# Patient Record
Sex: Female | Born: 1970 | Race: White | Hispanic: No | Marital: Married | Smoking: Never smoker
Health system: Southern US, Community
[De-identification: ages and names within clinical notes are randomized; demographics above are authoritative.]

## PROBLEM LIST (undated history)

## (undated) DIAGNOSIS — N183 Chronic kidney disease, stage 3 unspecified: Secondary | ICD-10-CM

## (undated) DIAGNOSIS — E1122 Type 2 diabetes mellitus with diabetic chronic kidney disease: Secondary | ICD-10-CM

## (undated) DIAGNOSIS — F4310 Post-traumatic stress disorder, unspecified: Secondary | ICD-10-CM

## (undated) DIAGNOSIS — F419 Anxiety disorder, unspecified: Secondary | ICD-10-CM

## (undated) DIAGNOSIS — F329 Major depressive disorder, single episode, unspecified: Secondary | ICD-10-CM

## (undated) DIAGNOSIS — F32A Depression, unspecified: Secondary | ICD-10-CM

## (undated) HISTORY — DX: Chronic kidney disease, stage 3 unspecified: N18.30

## (undated) HISTORY — DX: Major depressive disorder, single episode, unspecified: F32.9

## (undated) HISTORY — DX: Depression, unspecified: F32.A

## (undated) HISTORY — DX: Type 2 diabetes mellitus with diabetic chronic kidney disease: E11.22

## (undated) HISTORY — DX: Post-traumatic stress disorder, unspecified: F43.10

## (undated) HISTORY — DX: Chronic kidney disease, stage 3 (moderate): N18.3

## (undated) HISTORY — DX: Anxiety disorder, unspecified: F41.9

---

## 1997-08-29 ENCOUNTER — Ambulatory Visit (HOSPITAL_COMMUNITY): Admission: RE | Admit: 1997-08-29 | Discharge: 1997-08-30 | Payer: Self-pay | Admitting: Ophthalmology

## 1998-01-09 ENCOUNTER — Ambulatory Visit (HOSPITAL_COMMUNITY): Admission: RE | Admit: 1998-01-09 | Discharge: 1998-01-10 | Payer: Self-pay | Admitting: Ophthalmology

## 2006-06-16 ENCOUNTER — Encounter: Payer: Self-pay | Admitting: Family Medicine

## 2006-06-16 LAB — CONVERTED CEMR LAB
Cholesterol: 181 mg/dL
Triglycerides: 196 mg/dL

## 2006-06-18 ENCOUNTER — Encounter: Payer: Self-pay | Admitting: Family Medicine

## 2006-06-18 LAB — CONVERTED CEMR LAB
Chloride: 109 meq/L
Potassium: 4.8 meq/L

## 2006-11-12 ENCOUNTER — Ambulatory Visit: Payer: Self-pay | Admitting: Family Medicine

## 2006-11-12 DIAGNOSIS — F419 Anxiety disorder, unspecified: Secondary | ICD-10-CM

## 2006-11-12 DIAGNOSIS — F331 Major depressive disorder, recurrent, moderate: Secondary | ICD-10-CM

## 2006-11-12 DIAGNOSIS — R635 Abnormal weight gain: Secondary | ICD-10-CM | POA: Insufficient documentation

## 2006-12-15 ENCOUNTER — Telehealth: Payer: Self-pay | Admitting: Family Medicine

## 2006-12-16 ENCOUNTER — Ambulatory Visit: Payer: Self-pay | Admitting: Family Medicine

## 2006-12-16 DIAGNOSIS — G4700 Insomnia, unspecified: Secondary | ICD-10-CM | POA: Insufficient documentation

## 2007-12-26 HISTORY — PX: OTHER SURGICAL HISTORY: SHX169

## 2008-02-09 HISTORY — PX: BREAST SURGERY: SHX581

## 2008-02-09 HISTORY — PX: LIPOSUCTION: SHX10

## 2009-02-19 ENCOUNTER — Encounter: Admission: RE | Admit: 2009-02-19 | Discharge: 2009-02-19 | Payer: Self-pay

## 2009-02-19 ENCOUNTER — Ambulatory Visit: Payer: Self-pay | Admitting: Obstetrics and Gynecology

## 2009-02-20 LAB — HM PAP SMEAR

## 2009-02-20 LAB — HM MAMMOGRAPHY

## 2009-02-21 ENCOUNTER — Ambulatory Visit: Payer: Self-pay | Admitting: Obstetrics & Gynecology

## 2009-05-14 ENCOUNTER — Ambulatory Visit: Payer: Self-pay | Admitting: Occupational Medicine

## 2009-05-14 DIAGNOSIS — M6530 Trigger finger, unspecified finger: Secondary | ICD-10-CM | POA: Insufficient documentation

## 2009-07-03 ENCOUNTER — Ambulatory Visit: Payer: Self-pay | Admitting: Family Medicine

## 2009-07-03 DIAGNOSIS — J069 Acute upper respiratory infection, unspecified: Secondary | ICD-10-CM | POA: Insufficient documentation

## 2009-07-03 DIAGNOSIS — J301 Allergic rhinitis due to pollen: Secondary | ICD-10-CM

## 2009-09-18 ENCOUNTER — Ambulatory Visit: Payer: Self-pay | Admitting: Obstetrics & Gynecology

## 2009-10-05 ENCOUNTER — Ambulatory Visit: Payer: Self-pay | Admitting: Family Medicine

## 2009-10-24 ENCOUNTER — Telehealth: Payer: Self-pay | Admitting: Family Medicine

## 2009-10-24 ENCOUNTER — Ambulatory Visit: Payer: Self-pay | Admitting: Family Medicine

## 2009-10-25 ENCOUNTER — Telehealth: Payer: Self-pay | Admitting: Family Medicine

## 2009-10-26 ENCOUNTER — Telehealth (INDEPENDENT_AMBULATORY_CARE_PROVIDER_SITE_OTHER): Payer: Self-pay | Admitting: *Deleted

## 2009-10-30 ENCOUNTER — Telehealth: Payer: Self-pay | Admitting: Family Medicine

## 2009-11-23 ENCOUNTER — Ambulatory Visit: Payer: Self-pay | Admitting: Family Medicine

## 2009-11-23 DIAGNOSIS — I10 Essential (primary) hypertension: Secondary | ICD-10-CM | POA: Insufficient documentation

## 2009-11-23 DIAGNOSIS — E669 Obesity, unspecified: Secondary | ICD-10-CM | POA: Insufficient documentation

## 2009-11-23 LAB — CONVERTED CEMR LAB: Hgb A1c MFr Bld: 8.4 %

## 2009-11-27 ENCOUNTER — Telehealth: Payer: Self-pay | Admitting: Family Medicine

## 2009-12-17 ENCOUNTER — Ambulatory Visit: Payer: Self-pay | Admitting: Family Medicine

## 2010-02-24 ENCOUNTER — Ambulatory Visit
Admission: RE | Admit: 2010-02-24 | Discharge: 2010-02-24 | Payer: Self-pay | Source: Home / Self Care | Admitting: Family Medicine

## 2010-02-24 DIAGNOSIS — S99919A Unspecified injury of unspecified ankle, initial encounter: Secondary | ICD-10-CM

## 2010-02-24 DIAGNOSIS — S99929A Unspecified injury of unspecified foot, initial encounter: Secondary | ICD-10-CM

## 2010-02-24 DIAGNOSIS — S93609A Unspecified sprain of unspecified foot, initial encounter: Secondary | ICD-10-CM | POA: Insufficient documentation

## 2010-02-24 DIAGNOSIS — S8990XA Unspecified injury of unspecified lower leg, initial encounter: Secondary | ICD-10-CM | POA: Insufficient documentation

## 2010-02-27 ENCOUNTER — Telehealth (INDEPENDENT_AMBULATORY_CARE_PROVIDER_SITE_OTHER): Payer: Self-pay | Admitting: *Deleted

## 2010-03-05 ENCOUNTER — Ambulatory Visit
Admission: RE | Admit: 2010-03-05 | Discharge: 2010-03-05 | Payer: Self-pay | Source: Home / Self Care | Attending: Obstetrics & Gynecology | Admitting: Obstetrics & Gynecology

## 2010-03-13 ENCOUNTER — Ambulatory Visit
Admission: RE | Admit: 2010-03-13 | Discharge: 2010-03-13 | Payer: Self-pay | Source: Home / Self Care | Attending: Obstetrics & Gynecology | Admitting: Obstetrics & Gynecology

## 2010-03-19 ENCOUNTER — Encounter: Payer: Self-pay | Admitting: Family Medicine

## 2010-03-19 ENCOUNTER — Ambulatory Visit
Admission: RE | Admit: 2010-03-19 | Discharge: 2010-03-19 | Payer: Self-pay | Source: Home / Self Care | Attending: Family Medicine | Admitting: Family Medicine

## 2010-03-19 DIAGNOSIS — M545 Low back pain: Secondary | ICD-10-CM | POA: Insufficient documentation

## 2010-03-19 DIAGNOSIS — F909 Attention-deficit hyperactivity disorder, unspecified type: Secondary | ICD-10-CM | POA: Insufficient documentation

## 2010-03-19 LAB — CONVERTED CEMR LAB: Hgb A1c MFr Bld: 8.1 %

## 2010-03-26 ENCOUNTER — Encounter (INDEPENDENT_AMBULATORY_CARE_PROVIDER_SITE_OTHER): Payer: Self-pay | Admitting: *Deleted

## 2010-03-26 NOTE — Progress Notes (Signed)
       New/Updated Medications: BD PEN NEEDLE MINI U/F 31G X 5 MM MISC (INSULIN PEN NEEDLE) Use as directed for insulin Prescriptions: BD PEN NEEDLE MINI U/F 31G X 5 MM MISC (INSULIN PEN NEEDLE) Use as directed for insulin  #1 box x 3   Entered by:   Payton Spark CMA   Authorized by:   Seymour Bars DO   Signed by:   Payton Spark CMA on 10/26/2009   Method used:   [facility] to        [facility] [phone]* (retail)       [address]       [place], [place]  [address]       Ph: [phone] or [phone]       Fax: [phone]   RxID:   [id number]
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
## 2010-03-26 NOTE — Letter (Signed)
Summary: Work Paediatric nurse [facility]  [address]   [place], [place] [address]   Phone: [phone]  Fax: [phone]    Today's Date: May 14, 2009  Name of Patient: Melissa [name] [place]  The above named patient had a medical visit today at:  am / pm.  Please take this into consideration when reviewing the time away from work/school.    Special Instructions:  [  ] None  [ X ] To be off the remainder of today, returning to the normal work / school schedule tomorrow.  [  ] To be off until the next scheduled appointment on ______________________.  [  ] Other ________________________________________________________________ ________________________________________________________________________   Sincerely yours,   Lucia Gaskins MD

## 2010-03-26 NOTE — Progress Notes (Signed)
Summary: Latisse       New/Updated Medications: LATISSE 0.03 % SOLN (BIMATOPROST) Use as directed. Prescriptions: LATISSE 0.03 % SOLN (BIMATOPROST) Use as directed.  #1 bottle x 1   Entered by:   Kathlene November   Authorized by:   Seymour Bars DO   Signed by:   Kathlene November on 10/25/2009   Method used:   [facility]        [facility] [phone]* (retail)       [address]       [place], [place]  [address]       Ph: [phone] or [phone]       Fax: [phone]   RxID:   [id number]    Pt also uses Latisse and [facility] Is this OK?  Dr. Leonard Schwartz

## 2010-03-26 NOTE — Assessment & Plan Note (Signed)
Summary: BACK PAIN/NUMBNESS  (left arm) Cuff size:   regular  Vitals Entered By: Areta Haber CMA (October 05, 2009 2:37 PM)                  Current Allergies: ! PCN ! * BENZACAINE Assessment PATIENT DECIDED TO PROCEED TO ER  The patient and/or caregiver has been counseled thoroughly with regard to medications prescribed including dosage, schedule, interactions, rationale for use, and possible side effects and they verbalize understanding.  Diagnoses and expected course of recovery discussed and will return if not improved as expected or if the condition worsens. Patient and/or caregiver verbalized understanding.
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
## 2010-03-26 NOTE — Progress Notes (Signed)
Summary: Multiple questions  Phone Note Call from Patient Call back at [phone]   Caller: Patient Call For: Seymour Bars DO Summary of Call: Needs refill on  Am lactin Lotion apply to dry skin daily and Retina micro derm cream use once a day for wrinkles and dark spots- was told to call with these names for you to call in- [facility] [address] and then 90 day supply sent to [facility] for both.  Sugars have been running high since decreased Levemir- am sugars 240-260 and afternoons around 90- 110. Needs her Xanax sent to [facility] as well she says she is completely out of them. Initial call taken by: Kathlene November LPN,  November 27, 2009 11:04 AM  Follow-up for Phone Call        #270 of Xanax sent in 9/30 -- she is NOT due for RF of this. I sent in her 2 creams locally and mail order.  Keep on on sugars. I cut her dose of Levemir due to lows. Call if AM fastings are consistently running >150 and if so, will go back up to 40 units on her Levemir.   Follow-up by: Seymour Bars DO,  November 27, 2009 11:15 AM    New/Updated Medications: * AMLACTIN LOTION apply to dry skin daily TRETINOIN 0.025 % CREA (TRETINOIN) apply to wrinkles and dry spots at bedtime as directed Prescriptions: TRETINOIN 0.025 % CREA (TRETINOIN) apply to wrinkles and dry spots at bedtime as directed  #1 tube x 0   Entered and Authorized by:   Seymour Bars DO   Signed by:   Seymour Bars DO on 11/27/2009   Method used:   [facility]        [facility] [phone]* (retail)       [address]       [place], [place]  [address]       Ph: [phone] or [phone]       Fax: [phone]   RxID:   [id number] AMLACTIN LOTION apply to dry skin daily  #1 bottle x 0   Entered and Authorized by:   Seymour Bars DO   Signed by:   Seymour Bars DO on 11/27/2009   Method used:   Printed then faxed to ...       [facility]  [address] [phone]* (retail)       [address] [place], [place]   [address]       Ph: [phone] or [phone]       Fax: [phone]   RxID:   [id number]   Appended Document: Multiple questions Prescriptions: TRETINOIN 0.025 % CREA (TRETINOIN) apply to wrinkles and dry spots at bedtime as directed  #3 tubes x 1   Entered and Authorized by:   Seymour Bars DO   Signed by:   Seymour Bars DO on 11/27/2009   Method used:   Printed then faxed to ...       [facility]  [address] [phone]* (retail)       [address] [place], [place]  [address]       Ph: [phone] or [phone]       Fax: [phone]   RxID:   [id number] AMLACTIN LOTION apply to dry skin daily  #3 bottles x 1   Entered and Authorized by:   Seymour Bars DO   Signed by:  Seymour Bars DO on 11/27/2009   Method used:   Printed then faxed to ...       [facility]  [address] [phone]* (retail)       [address] [place], [place]  [address]       Ph: [phone] or [phone]       Fax: [phone]   RxID:   [id number]   Appended Document: Multiple questions 11/27/2009 @ 11:23am- Pt notified of MD instructions and that meds were sent. kJ LPN

## 2010-03-26 NOTE — Progress Notes (Signed)
Summary: pt has questions about meds and wants to come back in-jr  Phone Note Call from Patient   Caller: Patient Call For: Seymour Bars DO Summary of Call: Pt was seen by Dr.Bowen yesterday and states she would like to come back today because she has a few questions she forgot to ask while she was here and she also has a problem with her Diabetic medicine.. She states she left Marcelino Duster a message yesterday and hasn't heard anything back... Please try cell # first and if she does not answer call her home number.Call [phone] or Home#[phone] Thanks.Michaelle Copas  October 25, 2009 9:40 AM  Initial call taken by: Michaelle Copas,  October 25, 2009 9:40 AM  Follow-up for Phone Call        Having a reaction to Amaryl- causing itching, nausea, vomiting and H/A. Got the flex pen but needs a rx for the needles for the flex pen [facility] [address]. Also discussed changing the Ambien to the extended release because only sleeping 3 hours a night- would like this sent to pharmacy as well Needs rx for Xanax to fax to [facility]  Follow-up by: Kathlene November,  October 25, 2009 10:08 AM  Additional Follow-up for Phone Call Additional follow up Details #1::        amaryl added to allergy list -- STOP. Replace with Metformin once daily, sent to pharmacy. Pen needles sent to pharmacy today. Xanax - need proof of last fill date from pharmacy. Ambien CR was sent YESTERDAY. Additional Follow-up by: Seymour Bars DO,  October 25, 2009 10:38 AM   New Allergies: ! AMARYL (GLIMEPIRIDE) Additional Follow-up for Phone Call Additional follow up Details #2::    Pt notified that med and needles sent to pharmacy. Instructed pt needs to get pharmacy to fax [place] copy of last refill date for the Xanax and verify that pharmacy did not receive Ambien rx. Pt states they did not Follow-up by: Kathlene November,  October 25, 2009 10:42 AM  New/Updated Medications: GLUCOPHAGE XR 500 MG XR24H-TAB (METFORMIN  HCL) 1 tab by mouth daily EXEL PEN NEEDLES 1/3" 31G X 8 MM MISC (INSULIN PEN NEEDLE) use daily as directed New Allergies: ! AMARYL (GLIMEPIRIDE)Prescriptions: GLUCOPHAGE XR 500 MG XR24H-TAB (METFORMIN HCL) 1 tab by mouth daily  #30 x 2   Entered and Authorized by:   Seymour Bars DO   Signed by:   Seymour Bars DO on 10/25/2009   Method used:   [facility]        [facility] [phone]* (retail)       [address]       [place], [place]  [address]       Ph: [phone] or [phone]       Fax: [phone]   RxID:   [id number] EXEL PEN NEEDLES 1/3" 31G X 8 MM MISC (INSULIN PEN NEEDLE) use daily as directed  #100 x 3   Entered and Authorized by:   Seymour Bars DO   Signed by:   Seymour Bars DO on 10/25/2009   Method used:   Electronically to        [facility] [phone]* (retail)       [address]       [place], [place]  [address]       Ph: [phone] or [phone]       Fax: [phone]   RxID:   [id number]

## 2010-03-26 NOTE — Progress Notes (Signed)
Summary: Insulin ?  Phone Note Call from Patient   Caller: Patient Summary of Call: Pt [facility] stating she is doing well w/ new insulin. Pt would like to know if you can also change her to a daytime insulin so she can stop oral meds. Please advise. Initial call taken by: Payton Spark CMA,  October 30, 2009 11:26 AM  Follow-up for Phone Call        Will stop her Metformin. Will change Humulin R to Apidra which is mealtime insulin.  She is going to start with 5 units injection at the start of each meal.   Stay on Levemir once a day long acting insulin.  I will set her up with nutritionist to work on carb conting with her new start of Apidra. Follow-up by: Seymour Bars DO,  October 30, 2009 11:37 AM    New/Updated Medications: APIDRA SOLOSTAR 100 UNIT/ML SOLN (INSULIN GLULISINE) 5-10 units [place] injection with breakfast, lunch and dinner Prescriptions: APIDRA SOLOSTAR 100 UNIT/ML SOLN (INSULIN GLULISINE) 5-10 units  injection with breakfast, lunch and dinner  #2 boxes x 3   Entered and Authorized by:   Seymour Bars DO   Signed by:   Seymour Bars DO on 10/30/2009   Method used:   [facility]        [facility] [phone]* (retail)       [address]       [place], [place]  [address]       Ph: [phone] or [phone]       Fax: [phone]   RxID:   [id number]   Appended Document: Insulin ? 10/30/2009 @ 11:42am- LM with mother for pt to return call. KJ LPN  Appended Document: Insulin ? LMOM for Pt to CB.Arvilla Market CMA, Michelle October 30, 2009 11:53 AM   Pt aware of the above.Arvilla Market CMA, Michelle October 30, 2009 1:03 PM
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
## 2010-03-26 NOTE — Letter (Signed)
Summary: Out of Work  [facility]  [address]   [place], [place] [address]   Phone: [phone]  Fax: [phone]    Jul 03, 2009   Employee:  [employer] [place]    To Whom It May Concern:   For Medical reasons, please excuse the above named employee from work today and tomorrow.    If you need additional information, please feel free to contact our office.         Sincerely,    Donna Christen MD
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
## 2010-03-28 NOTE — Progress Notes (Signed)
  Phone Note Outgoing Call Call back at [facility] Phone [phone]   Call placed by: Lajean Saver RN,  February 27, 2010 11:44 AM Call placed to: Patient Summary of Call: Callback: No answer. Message left with reason for call and call back with any questions or concerns
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
## 2010-03-30 ENCOUNTER — Encounter: Payer: Self-pay | Admitting: Obstetrics & Gynecology

## 2010-04-03 NOTE — Letter (Signed)
Summary: Primary Care Consult Scheduled Letter  [facility] [place]  [address]   [place], [place] [address]   Phone: [phone]  Fax: [phone]      03/26/2010 MRN: [id number]  [place] [address] [place], [place]  [address]    Dear Ms. [place],  We have scheduled an appointment for you.  At the recommendation of Dr.Bowen, we have scheduled you a consult with [facility] on 06-21-10 at 9:00.  Their address is [address] , [address] [place].. The office phone number is [phone].  If this appointment day and time is not convenient for you, please feel free to call the office of the doctor you are being referred to at the number listed above and reschedule the appointment.     It is important for you to keep your scheduled appointments. We are here to make sure you are given good patient care.     Thank you, Michaelle Copas Patient Care Coordinator [facility] Kathryne Sharper

## 2010-04-15 ENCOUNTER — Encounter: Payer: Self-pay | Admitting: Family Medicine

## 2010-04-17 NOTE — Letter (Signed)
Summary: ADHD Questionnaire  ADHD Questionnaire   Imported By: Lanelle Bal 04/09/2010 13:17:54  _____________________________________________________________________  External Attachment:    Type:   Image     Comment:   External Document

## 2010-04-18 ENCOUNTER — Telehealth: Payer: Self-pay | Admitting: Family Medicine

## 2010-04-23 ENCOUNTER — Ambulatory Visit: Payer: BC Managed Care – PPO | Admitting: Obstetrics & Gynecology

## 2010-04-23 DIAGNOSIS — Z30431 Encounter for routine checking of intrauterine contraceptive device: Secondary | ICD-10-CM

## 2010-04-23 DIAGNOSIS — N930 Postcoital and contact bleeding: Secondary | ICD-10-CM

## 2010-04-23 NOTE — Progress Notes (Addendum)
Summary: [facility]-Referral  Phone Note Call from Patient Call back at Home Phone [phone] Call back at [phone]   Caller: Patient Call For: Seymour Bars DO Summary of Call: Pt is unable to afford $40/visit copay for PT.  Pt previously went 3 x week.  Pt is wondering if she can have a consult at pain clinic instead.  Afternoons will be better, prefers [place], but doesn't have to be.  Advised pt referral will take a few days to complete after approval is given. Also, pt states her lawyer will be requesting records soon regarding back pain.  Advised records are copied on Thursdays only.  Pt is agreeable. Initial call taken by: Francee Piccolo CMA Duncan Dull),  April 18, 2010 11:39 AM  Follow-up for Phone Call        1. I don't have any records in regards to her back pain -- I would need this from ortho prior to making a referral. 2. I still think that PT or chiropractic therapy is a good adjunct for her chronic back pain and am against only treating her with narcotics. 3.  Our office need a signed release for any medical records that are requested. Follow-up by: Seymour Bars DO,  April 18, 2010 11:50 AM     Appended Document: [facility]-Referral [facility]OM for Pt to CB

## 2010-05-09 ENCOUNTER — Encounter: Payer: Self-pay | Admitting: Family Medicine

## 2010-05-19 ENCOUNTER — Encounter: Payer: Self-pay | Admitting: Family Medicine

## 2010-05-20 ENCOUNTER — Ambulatory Visit: Payer: Self-pay | Admitting: Family Medicine

## 2010-05-21 ENCOUNTER — Ambulatory Visit: Payer: BC Managed Care – PPO | Admitting: Family Medicine

## 2010-05-27 ENCOUNTER — Encounter: Payer: Self-pay | Admitting: Family Medicine

## 2010-06-05 ENCOUNTER — Encounter: Payer: Self-pay | Admitting: Family Medicine

## 2010-06-05 ENCOUNTER — Ambulatory Visit (INDEPENDENT_AMBULATORY_CARE_PROVIDER_SITE_OTHER): Payer: BC Managed Care – PPO | Admitting: Family Medicine

## 2010-06-05 DIAGNOSIS — F329 Major depressive disorder, single episode, unspecified: Secondary | ICD-10-CM

## 2010-06-05 DIAGNOSIS — E119 Type 2 diabetes mellitus without complications: Secondary | ICD-10-CM

## 2010-06-05 DIAGNOSIS — I10 Essential (primary) hypertension: Secondary | ICD-10-CM

## 2010-06-05 MED ORDER — AMBULATORY NON FORMULARY MEDICATION: Status: DC

## 2010-06-05 MED ORDER — INSULIN DETEMIR 100 UNIT/ML ~~LOC~~ SOLN: 46.0000 [IU] | Freq: Every day | SUBCUTANEOUS | Status: DC

## 2010-06-05 NOTE — Progress Notes (Signed)
  Subjective:    Patient ID: Melissa [name] [place], female    DOB: Jun 01, 1970, 40 y.o.   MRN: [id number]  HPI  40 yo WF presents for f/u visit.  She has stopped her weight loss program at [employer].  She has regained some of her weight but continues to work on diabetic diet with regular exercise.  She adjusted Levemir to 46 u at bedtime (an increase after having steroid injections in her back which raised her readings).  This has her AM fastings 100-130.  She is continuing to see pysch, on Xanax, Wellbutrin and doing fairly well.  Her back pain is improving thru pain mgmt.  She would like to RF her Retin A and Latisse today and requests a new meter to check sugars.  She is doing Novolog 10-14 u with meals.  Denies any lows.  Her eye exam is UTD. She is not on anything for BP and reports readings of 120's/ 80s at home and at specialist visitis.  BP 182/101  Pulse 111  Ht 5\' 4"  (1.626 m)  Wt 172 lb (78.019 kg)  BMI 29.52 kg/m2  SpO2 98%     Review of Systems  Constitutional: Positive for unexpected weight change. Negative for fever, appetite change and fatigue.  Eyes: Negative for visual disturbance.  Respiratory: Negative for shortness of breath.   Cardiovascular: Negative for chest pain, palpitations and leg swelling.  Gastrointestinal: Negative for diarrhea and constipation.  Genitourinary: Negative for frequency.  Musculoskeletal: Positive for back pain.  Neurological: Negative for numbness.  Psychiatric/Behavioral: Negative for suicidal ideas, sleep disturbance, dysphoric mood and agitation. The patient is nervous/anxious.        Objective:   Physical Exam  Constitutional: She appears well-developed and well-nourished.       overwt  HENT:  Head: Normocephalic and atraumatic.  Eyes: Pupils are equal, round, and reactive to light. No scleral icterus.  Neck: Normal range of motion. Neck supple. No thyromegaly present.  Cardiovascular: Normal rate, regular rhythm and normal heart sounds.    No murmur heard. Pulmonary/Chest: Effort normal and breath sounds normal.  Musculoskeletal: She exhibits no edema.  Skin: Skin is warm and dry.  Psychiatric: She has a normal mood and affect.          Assessment & Plan:

## 2010-06-05 NOTE — Patient Instructions (Signed)
Meds RFd.  Keep an eye on Blood pressure; goal at home is <130/80.  Drop down on Levemir if sugar running <100 AM fasting once done with steroid injections.  Bring by copy of labs.  Return for A1C/ WT/ BP check in 1 month (nurse visit)

## 2010-06-07 MED ORDER — BIMATOPROST 0.03 % EX SOLN: 1.0000 [drp] | Freq: Every day | CUTANEOUS | Status: DC

## 2010-06-07 MED ORDER — ZOLPIDEM TARTRATE ER 12.5 MG PO TBCR: 12.5000 mg | EXTENDED_RELEASE_TABLET | Freq: Every evening | ORAL | Status: DC | PRN

## 2010-06-07 MED ORDER — ALPRAZOLAM 1 MG PO TABS: 1.0000 mg | ORAL_TABLET | Freq: Three times a day (TID) | ORAL | Status: DC

## 2010-06-07 MED ORDER — TRETINOIN 0.025 % EX CREA: TOPICAL_CREAM | Freq: Every day | CUTANEOUS | Status: DC

## 2010-06-07 MED ORDER — INSULIN PEN NEEDLE 32G X 5 MM MISC: Status: DC

## 2010-06-07 NOTE — Assessment & Plan Note (Signed)
BP HIGH today.  She is not taking anything and reports her home readings to be 'normal'.  She will monitor them at home and call if >130/80 and RTC in 1 mo for a nurse BP check.

## 2010-06-07 NOTE — Assessment & Plan Note (Signed)
She was not quite due for A1C yet.  Her last one was 8.1 and her home readings look good on current dose of Levemir and Novolog.  Continue and look for lower readings once steroid injections are completed.  Her eye exam and labs are UTD.  Will check old chart for last urine micro since she is not on an ACEi.

## 2010-06-07 NOTE — Assessment & Plan Note (Signed)
Stable.  Seeing psych.

## 2010-06-18 ENCOUNTER — Encounter: Payer: Self-pay | Admitting: Family Medicine

## 2010-06-21 ENCOUNTER — Ambulatory Visit (INDEPENDENT_AMBULATORY_CARE_PROVIDER_SITE_OTHER): Payer: BC Managed Care – PPO | Admitting: Psychiatry

## 2010-06-21 DIAGNOSIS — F988 Other specified behavioral and emotional disorders with onset usually occurring in childhood and adolescence: Secondary | ICD-10-CM

## 2010-06-21 DIAGNOSIS — F411 Generalized anxiety disorder: Secondary | ICD-10-CM

## 2010-06-21 DIAGNOSIS — F339 Major depressive disorder, recurrent, unspecified: Secondary | ICD-10-CM

## 2010-07-05 ENCOUNTER — Other Ambulatory Visit: Payer: BC Managed Care – PPO
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
## 2010-07-09 NOTE — Assessment & Plan Note (Signed)
Melissa [name], GARROD                ACCOUNT NO.:  [id number]   MEDICAL RECORD NO.:  [id number]           PATIENT TYPE:   LOCATION:  [facility] [place]           FACILITY:   PHYSICIAN:  Caren Griffins, CNM       DATE OF BIRTH:  02/26/1970   DATE OF SERVICE:  02/19/2009                                  CLINIC NOTE   REASON FOR VISIT:  Annual GYN exam, desires Pap, and desires removal and  replacement of Mirena IUD.   HISTORY:  This is a 40 year old, G0, P0, who has no complaints today and  is very happy with the IUD which she had placed by Dr. Mia Creek in  [place] in January 2006.  She says that she was told the strings were  cut short and she has never done manual string checks.  She has been  amenorrheic for 4 years.  She states that she has white coat  hypertension and that she checks her blood pressure frequently at work  and it is usually in 120/80.   MENSTRUAL HISTORY:  12 x 28 x 3-4.  No intermenstrual bleeding, no  dysmenorrhea, and as noted above no menses for about 4 years.   CONTRACEPTIVE HISTORY:  IUD only.   OBSTETRICAL HISTORY:  Nulliparous.   GYNECOLOGIC HISTORY:  Last Pap smear was November 2009 and was normal.  She did have an abnormal around 3 years ago and just had a repeat Pap.  Her initial baseline mammogram was done earlier today.  The reason it  was done was she had breast augmentation about a year ago and due to  inability to exam breast tissue.  Her advice was to get a screening  mammogram 6 months after that procedure.   SURGERIES:  Breasts implants on February 08, 2009, liposuction on  February 08, 2009, dental implants on January 09, 2009.   Family history is positive for diabetes and cardiovascular disease.  Her  father had colon cancer and lung cancer.  There have been no GYN cancers  in the family.   Past medical history is completely negative except depressive illness.  She is taking Wellbutrin 300 mg a day and is doing well on this;  however, she does have a lot of difficulty sleeping and takes Ambien to  fall asleep nearly every night.   ALLERGIES:  PENICILLIN rash.   MEDICATIONS:  As above.  She also takes multivitamin, not sure what type  or how much calcium or vitamin D is in it.  She had usual immunizations  including tetanus, flu, and pneumonia.   SOCIAL HISTORY:  Lives with her husband and stepchildren on occasion.  She works as a Dealer.  She is a nonsmoker and drinks socially  about 2 caffeinated beverages a day.  No IV drug use or substance abuse  or history of neglect.   Review of symptoms is completely negative except she does report weight  gain since her dosage of Wellbutrin and was recently increased.  She  denies any temperature, intolerance, or other thyroid-related symptoms.  However, as noted above she has gained weight and is depressed.   PHYSICAL EXAMINATION:  VITAL SIGNS:  Pulse  108, BP 135/90, weight 162,  height 5 feet 5 inches.  GENERAL:  WN and WD in no apparent distress.  HEENT:  Normocephalic.  Good dentition.  NECK:  No thyromegaly or lymphadenopathy.  HEART:  RRR without murmur.  LUNGS:  CTA bilateral.  ABDOMEN:  Soft, obese, nontender.  No masses.  EXTREMITIES:  Pulses full and equal.  No dependent edema.  PELVIC:  External genitalia significant for circumferential dark flat  pigmentation which she says is a birth mark and has been unchanged her  whole life.  It extends from left to right labia majora.  Vagina, pink,  well rugated, moist.  Cervix, clean, posterior, nulliparous.  Os, IUD  strings not visualized, attempted to retrieve from endocervix using only  sounds and Q-tip as other insurance were not available.  Unable to feel  strings bimanual.   ASSESSMENT:  1. Overweight.  2. Depression.  3. Intrauterine device strings.   PLAN:  Vernona Rieger did ultrasound and IUD appears to be in proper position.  She will return when we have the crochet hook type instrument and   attempt retrieval in the next week or two.  Continue multivitamin and  working out at [employer]  Of note, she has an appointment today with Dr.  Oda Kilts, primary care, to initiate care.           ______________________________  Caren Griffins, CNM     DP/MEDQ  D:  02/19/2009  T:  02/20/2009  Job:  [id number]

## 2010-07-25 ENCOUNTER — Other Ambulatory Visit: Payer: Self-pay | Admitting: Family Medicine

## 2010-08-09 ENCOUNTER — Encounter (INDEPENDENT_AMBULATORY_CARE_PROVIDER_SITE_OTHER): Payer: BC Managed Care – PPO | Admitting: Psychiatry

## 2010-08-09 DIAGNOSIS — F4310 Post-traumatic stress disorder, unspecified: Secondary | ICD-10-CM

## 2010-08-09 DIAGNOSIS — F909 Attention-deficit hyperactivity disorder, unspecified type: Secondary | ICD-10-CM

## 2010-09-03 ENCOUNTER — Other Ambulatory Visit: Payer: Self-pay | Admitting: Family Medicine

## 2010-09-05 ENCOUNTER — Other Ambulatory Visit: Payer: Self-pay | Admitting: *Deleted

## 2010-09-05 ENCOUNTER — Telehealth: Payer: Self-pay

## 2010-09-05 DIAGNOSIS — B373 Candidiasis of vulva and vagina: Secondary | ICD-10-CM

## 2010-09-05 MED ORDER — FLUCONAZOLE 150 MG PO TABS: 150.0000 mg | ORAL_TABLET | Freq: Once | ORAL | Status: AC

## 2010-09-05 MED ORDER — ALPRAZOLAM 1 MG PO TABS: 1.0000 mg | ORAL_TABLET | Freq: Three times a day (TID) | ORAL | Status: DC

## 2010-09-05 NOTE — Assessment & Plan Note (Signed)
NAME:  [place], Crystalle              ACCOUNT NO.:  [id number]  MEDICAL RECORD NO.:  [id number]          PATIENT TYPE:  POB  LOCATION:  [facility] [place]         FACILITY[facility]  PHYSICIAN:  Elsie Lincoln, MD      DATE OF BIRTH:  [birth year]  DATE OF SERVICE:  04/23/2010                                 CLINIC NOTE  The patient is a 40 year old female who presents for string check.  She had GC and Chlamydia cultures that were done at the time of placement and they are negative.  The patient has some bleeding since the insertion, which can be normal that she experienced with her last Mirena insertion.  There is scant brown blood currently.  Upon speculum examination, the cervix appears normal with strings 2 to 3 cm, which is still in favorable cut.  ASSESSMENT AND PLAN:  A 40 year old female with intrauterine device in situ. 1. Provera 10 mg 1 tablet p.o. q.14 days, prescription given.  The     patient can take it if the bleeding becomes bothersome, this will     thin the endometrium more or hopefully the IUD becomes situated     better in the endometrium and give her better bleeding profile. 2. Health maintenance up-to-date. 3. Return to clinic in a year for yearly visit.          ______________________________ Elsie Lincoln, MD    KL/MEDQ  D:  04/23/2010  T:  04/24/2010  Job:  [id number]

## 2010-09-05 NOTE — Telephone Encounter (Signed)
Spoke with pt, she is experiencing symptoms of yeast from an antibiotic that was given by her dentist with recent dental procedure.  Rx for Diflucan 150mg  x1 [facility] in [place].

## 2010-09-25 ENCOUNTER — Encounter (INDEPENDENT_AMBULATORY_CARE_PROVIDER_SITE_OTHER): Payer: BC Managed Care – PPO | Admitting: Psychiatry

## 2010-09-25 DIAGNOSIS — F909 Attention-deficit hyperactivity disorder, unspecified type: Secondary | ICD-10-CM

## 2010-09-25 DIAGNOSIS — F4310 Post-traumatic stress disorder, unspecified: Secondary | ICD-10-CM

## 2010-10-06 ENCOUNTER — Encounter: Payer: Self-pay | Admitting: Emergency Medicine

## 2010-10-06 ENCOUNTER — Other Ambulatory Visit: Payer: Self-pay | Admitting: Emergency Medicine

## 2010-10-06 ENCOUNTER — Inpatient Hospital Stay (INDEPENDENT_AMBULATORY_CARE_PROVIDER_SITE_OTHER)
Admission: RE | Admit: 2010-10-06 | Discharge: 2010-10-06 | Disposition: A | Discharge status: Home / Self Care | Payer: BC Managed Care – PPO | Source: Ambulatory Visit | Attending: Emergency Medicine | Admitting: Emergency Medicine

## 2010-10-06 ENCOUNTER — Ambulatory Visit
Admission: RE | Admit: 2010-10-06 | Discharge: 2010-10-06 | Disposition: A | Discharge status: Home / Self Care | Payer: BC Managed Care – PPO | Source: Ambulatory Visit | Attending: Emergency Medicine | Admitting: Emergency Medicine

## 2010-10-06 DIAGNOSIS — R05 Cough: Secondary | ICD-10-CM | POA: Insufficient documentation

## 2010-10-06 DIAGNOSIS — J209 Acute bronchitis, unspecified: Secondary | ICD-10-CM

## 2010-11-25 ENCOUNTER — Encounter (HOSPITAL_COMMUNITY): Payer: BC Managed Care – PPO | Admitting: Psychiatry

## 2010-11-29 ENCOUNTER — Encounter (INDEPENDENT_AMBULATORY_CARE_PROVIDER_SITE_OTHER): Payer: BC Managed Care – PPO | Admitting: Psychiatry

## 2010-11-29 DIAGNOSIS — F339 Major depressive disorder, recurrent, unspecified: Secondary | ICD-10-CM

## 2010-11-29 DIAGNOSIS — F909 Attention-deficit hyperactivity disorder, unspecified type: Secondary | ICD-10-CM

## 2011-01-09 ENCOUNTER — Other Ambulatory Visit (HOSPITAL_COMMUNITY): Payer: Self-pay | Admitting: Psychiatry

## 2011-01-09 DIAGNOSIS — F4310 Post-traumatic stress disorder, unspecified: Secondary | ICD-10-CM

## 2011-01-09 MED ORDER — ZOLPIDEM TARTRATE ER 12.5 MG PO TBCR: 12.5000 mg | EXTENDED_RELEASE_TABLET | Freq: Every evening | ORAL | Status: DC | PRN
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
## 2011-01-28 ENCOUNTER — Other Ambulatory Visit (HOSPITAL_COMMUNITY): Payer: Self-pay | Admitting: Psychiatry

## 2011-01-28 MED ORDER — LISDEXAMFETAMINE DIMESYLATE 50 MG PO CAPS: 50.0000 mg | ORAL_CAPSULE | ORAL | Status: DC

## 2011-01-30 ENCOUNTER — Telehealth (HOSPITAL_COMMUNITY): Payer: Self-pay

## 2011-01-30 MED ORDER — LISDEXAMFETAMINE DIMESYLATE 70 MG PO CAPS: 70.0000 mg | ORAL_CAPSULE | ORAL | Status: DC

## 2011-01-30 NOTE — Telephone Encounter (Signed)
Pt didn't realize until she filled and picked up her Vyvanse that it was for 50 mg instead of 70 mg can she get a new rx for 70 mg and she will return the 50mg 's to the pharmacy. Also would like [place] to call [facility] and let them know so that they can talk to insurance and let them know what happened.

## 2011-01-30 NOTE — Telephone Encounter (Signed)
[facility] and [facility] [place]. He will take the medication back until the new prescription. Patient will be notified which picks up a new prescription for Vyvanse 70 mg.

## 2011-02-04 ENCOUNTER — Encounter (HOSPITAL_COMMUNITY): Payer: Self-pay

## 2011-02-04 NOTE — Progress Notes (Signed)
Addended by: Darrell Jewel F on: 02/04/2011 10:38 AM   Modules accepted: Orders

## 2011-02-06 ENCOUNTER — Encounter (HOSPITAL_COMMUNITY): Payer: BC Managed Care – PPO | Admitting: Psychiatry

## 2011-02-20 ENCOUNTER — Ambulatory Visit (INDEPENDENT_AMBULATORY_CARE_PROVIDER_SITE_OTHER): Payer: BC Managed Care – PPO | Admitting: Physician Assistant

## 2011-02-20 ENCOUNTER — Encounter: Payer: Self-pay | Admitting: Physician Assistant

## 2011-02-20 ENCOUNTER — Telehealth: Payer: Self-pay | Admitting: *Deleted

## 2011-02-20 DIAGNOSIS — H9209 Otalgia, unspecified ear: Secondary | ICD-10-CM

## 2011-02-20 DIAGNOSIS — E119 Type 2 diabetes mellitus without complications: Secondary | ICD-10-CM

## 2011-02-20 DIAGNOSIS — H9201 Otalgia, right ear: Secondary | ICD-10-CM

## 2011-02-20 DIAGNOSIS — R05 Cough: Secondary | ICD-10-CM

## 2011-02-20 MED ORDER — INSULIN PEN NEEDLE 32G X 5 MM MISC: Status: DC

## 2011-02-20 MED ORDER — AZITHROMYCIN 250 MG PO TABS: ORAL_TABLET | ORAL | Status: DC

## 2011-02-20 MED ORDER — INSULIN DETEMIR 100 UNIT/ML ~~LOC~~ SOLN: 46.0000 [IU] | Freq: Every day | SUBCUTANEOUS | Status: DC

## 2011-02-20 MED ORDER — AZITHROMYCIN 1 G PO PACK: 1.0000 | PACK | Freq: Once | ORAL | Status: AC

## 2011-02-20 MED ORDER — INSULIN ASPART 100 UNIT/ML ~~LOC~~ SOLN: 5.0000 [IU] | Freq: Three times a day (TID) | SUBCUTANEOUS | Status: DC

## 2011-02-20 MED ORDER — HYDROCOD POLST-CHLORPHEN POLST 10-8 MG/5ML PO LQCR: 5.0000 mL | Freq: Two times a day (BID) | ORAL | Status: DC

## 2011-02-20 MED ORDER — CHLORPHENIRAMINE-HYDROCODONE 8-10 MG/5ML PO LQCR: 5.0000 mL | Freq: Two times a day (BID) | ORAL | Status: DC | PRN

## 2011-02-20 NOTE — Patient Instructions (Addendum)
Start Tussinex for cough at bedtime. Continue Mucinex and symptomatic care. Continue to take Zyrtec daily for ear pain. If worse in 2-3 days start zithromax due to history of pneumonia.   Make appt for Diabetes f/u.  Cough, Adult  A cough is a reflex that helps clear your throat and airways. It can help heal the body or may be a reaction to an irritated airway. A cough may only last 2 or 3 weeks (acute) or may last more than 8 weeks (chronic).  CAUSES Acute cough:  Viral or bacterial infections.  Chronic cough:  Infections.   Allergies.   Asthma.   Post-nasal drip.   Smoking.   Heartburn or acid reflux.   Some medicines.   Chronic lung problems (COPD).   Cancer.  SYMPTOMS   Cough.   Fever.   Chest pain.   Increased breathing rate.   High-pitched whistling sound when breathing (wheezing).   Colored mucus that you cough up (sputum).  TREATMENT   A bacterial cough may be treated with antibiotic medicine.   A viral cough must run its course and will not respond to antibiotics.   Your caregiver may recommend other treatments if you have a chronic cough.  HOME CARE INSTRUCTIONS   Only take over-the-counter or prescription medicines for pain, discomfort, or fever as directed by your caregiver. Use cough suppressants only as directed by your caregiver.   Use a cold steam vaporizer or humidifier in your bedroom or home to help loosen secretions.   Sleep in a semi-upright position if your cough is worse at night.   Rest as needed.   Stop smoking if you smoke.  SEEK IMMEDIATE MEDICAL CARE IF:   You have pus in your sputum.   Your cough starts to worsen.   You cannot control your cough with suppressants and are losing sleep.   You begin coughing up blood.   You have difficulty breathing.   You develop pain which is getting worse or is uncontrolled with medicine.   You have a fever.  MAKE SURE YOU:   Understand these instructions.   Will watch your  condition.   Will get help right away if you are not doing well or get worse.  Document Released: 08/09/2010 Document Revised: 10/23/2010 Document Reviewed: 08/09/2010 [facility] Patient Information 2012 [place], [place].

## 2011-02-20 NOTE — Telephone Encounter (Signed)
OK for pens.

## 2011-02-20 NOTE — Telephone Encounter (Signed)
Pt is requesting Novolog pen rather than vials that were sent.  MA, LPN

## 2011-02-20 NOTE — Telephone Encounter (Signed)
rx sent

## 2011-02-20 NOTE — Telephone Encounter (Signed)
Looked and don't see anything but the vials. Can you please enter this.  MA, LPN

## 2011-02-20 NOTE — Progress Notes (Signed)
Subjective:     Patient ID: Melissa [name], female   DOB: [birth year], 40 y.o.   MRN: [id number]  Sore Throat  This is a new problem. The current episode started in the past 7 days. The problem has been gradually worsening. Neither side of throat is experiencing more pain than the other. The maximum temperature recorded prior to her arrival was 100 - 100.9 F. The fever has been present for less than 1 day. The pain is mild. Associated symptoms include congestion, coughing, ear pain, headaches, shortness of breath and swollen glands. Pertinent negatives include no ear discharge or vomiting. She has had no exposure to strep. She has tried nothing for the symptoms. The treatment provided no relief.  Cough This is a new problem. The current episode started in the past 7 days. The problem has been gradually worsening. The problem occurs constantly. The cough is productive of sputum. Associated symptoms include chills, ear congestion, ear pain, headaches, nasal congestion, postnasal drip, a sore throat and shortness of breath. Pertinent negatives include no chest pain or fever. Associated symptoms comments: Right ear pain. The symptoms are aggravated by nothing. She has tried OTC cough suppressant (Dayquil and Nyquil for cough) for the symptoms. The treatment provided mild relief. Her past medical history is significant for bronchitis and pneumonia.  Otalgia  There is pain in the right ear. This is a new problem. The current episode started in the past 7 days. The problem occurs constantly. The problem has been unchanged. The maximum temperature recorded prior to her arrival was 100 - 100.9 F. The fever has been present for less than 1 day. The pain is mild. Associated symptoms include coughing, headaches and a sore throat. Pertinent negatives include no ear discharge or vomiting. She has tried nothing for the symptoms. The treatment provided no relief.   Patient's symptoms started Christmas day(2days ago) with  coughing, fever, N/V and chills. Patient had a fever of 101 for 1 day and since not ran a fever. No history of Asthma. Patient has a history of bronchitis that developed into pneumonia "multiple times".  The coughing keeps her up at night. She denies smoking.       Review of Systems  Constitutional: Positive for chills. Negative for fever.  HENT: Positive for ear pain, congestion, sore throat and postnasal drip. Negative for ear discharge.   Respiratory: Positive for cough and shortness of breath.        Cough keeps patient up at night.  Cardiovascular: Negative for chest pain.  Gastrointestinal: Negative for vomiting.  Neurological: Positive for headaches.       Objective:   Physical Exam  Constitutional: She appears well-developed and well-nourished.  HENT:  Head: Normocephalic and atraumatic.  Left Ear: External ear normal.  Nose: Nose normal.  Mouth/Throat: Oropharynx is clear and moist. No oropharyngeal exudate.       Right ear fluid behind ear.  Eyes: Conjunctivae are normal. Right eye exhibits no discharge. Left eye exhibits no discharge.  Neck:       Bilateral   Cardiovascular: Normal rate, regular rhythm and normal heart sounds.   Pulmonary/Chest: Effort normal. She has wheezes.       Wheezing at the base of right lung. Coarse breath sounds throughout bilaterally.  Lymphadenopathy:    She has cervical adenopathy.  Psychiatric: She has a normal mood and affect. Her behavior is normal.       Assessment:     1. Cough 2. Right ear pain 3. Diabetes  Mellitus    Plan:     1. Patient was given Tussinex to use at bedtimes for cough for that she can sleep. Given rx for Zpak to use if coughing and mucus production gets worse in the next few days to use due to history of bronchitis developing into pneumonia. Symptomatic care was discussed.  2. Patient told to continue to take zyrtec to help dry up any fluid in eustachian tube.  3. Patient's insulin was refilled. She was  told to make an appt in January to check HgA1C and to get more refills.

## 2011-02-21 ENCOUNTER — Telehealth: Payer: Self-pay | Admitting: *Deleted

## 2011-02-21 NOTE — Telephone Encounter (Signed)
Go ahead and write out of work for today.

## 2011-02-21 NOTE — Telephone Encounter (Signed)
Pt's husband called and states that the work note should have been written out for today and not yesterday. Please advise.  MA, LPN

## 2011-02-21 NOTE — Telephone Encounter (Signed)
LM for pt to pick up work note.  MA, LPN

## 2011-03-03 ENCOUNTER — Other Ambulatory Visit (HOSPITAL_COMMUNITY): Payer: Self-pay | Admitting: Psychiatry

## 2011-03-03 MED ORDER — LISDEXAMFETAMINE DIMESYLATE 70 MG PO CAPS: 70.0000 mg | ORAL_CAPSULE | ORAL | Status: DC

## 2011-03-11 ENCOUNTER — Other Ambulatory Visit (HOSPITAL_COMMUNITY): Payer: Self-pay | Admitting: Psychiatry

## 2011-03-11 MED ORDER — ALPRAZOLAM 1 MG PO TABS: 1.0000 mg | ORAL_TABLET | Freq: Three times a day (TID) | ORAL | Status: DC

## 2011-03-17 ENCOUNTER — Other Ambulatory Visit: Payer: Self-pay | Admitting: Family Medicine

## 2011-03-18 ENCOUNTER — Ambulatory Visit (HOSPITAL_COMMUNITY): Payer: Self-pay | Admitting: Psychiatry

## 2011-03-18 ENCOUNTER — Other Ambulatory Visit (HOSPITAL_COMMUNITY): Payer: Self-pay

## 2011-03-19 MED ORDER — LISDEXAMFETAMINE DIMESYLATE 70 MG PO CAPS: 70.0000 mg | ORAL_CAPSULE | ORAL | Status: DC

## 2011-03-19 NOTE — Telephone Encounter (Signed)
OK 

## 2011-03-25 ENCOUNTER — Ambulatory Visit (INDEPENDENT_AMBULATORY_CARE_PROVIDER_SITE_OTHER): Payer: BC Managed Care – PPO | Admitting: Family Medicine

## 2011-03-25 ENCOUNTER — Encounter: Payer: Self-pay | Admitting: Family Medicine

## 2011-03-25 VITALS — BP 139/79 | HR 101 | Wt 152.0 lb

## 2011-03-25 DIAGNOSIS — G5600 Carpal tunnel syndrome, unspecified upper limb: Secondary | ICD-10-CM

## 2011-03-25 DIAGNOSIS — L259 Unspecified contact dermatitis, unspecified cause: Secondary | ICD-10-CM

## 2011-03-25 DIAGNOSIS — I10 Essential (primary) hypertension: Secondary | ICD-10-CM

## 2011-03-25 DIAGNOSIS — E119 Type 2 diabetes mellitus without complications: Secondary | ICD-10-CM

## 2011-03-25 DIAGNOSIS — L309 Dermatitis, unspecified: Secondary | ICD-10-CM

## 2011-03-25 MED ORDER — AMBULATORY NON FORMULARY MEDICATION: Status: DC

## 2011-03-25 MED ORDER — BIMATOPROST 0.03 % EX SOLN: 1.0000 "application " | Freq: Every day | CUTANEOUS | Status: DC

## 2011-03-25 MED ORDER — PREGABALIN 50 MG PO CAPS: 50.0000 mg | ORAL_CAPSULE | Freq: Three times a day (TID) | ORAL | Status: DC

## 2011-03-25 MED ORDER — TRETINOIN 0.025 % EX CREA: TOPICAL_CREAM | Freq: Every day | CUTANEOUS | Status: DC

## 2011-03-25 NOTE — Progress Notes (Signed)
  Subjective:    Patient ID: Melissa [name], female    DOB: [birth year], 41 y.o.   MRN: [id number]  Diabetes She presents for her follow-up diabetic visit. She has type 2 diabetes mellitus. There are no hypoglycemic associated symptoms. There are no hypoglycemic complications. Symptoms are stable. There are no diabetic complications. When asked about current treatments, none were reported. Her weight is decreasing steadily. She is following a diabetic diet. She rarely participates in exercise. Her breakfast blood glucose range is generally 110-130 mg/dl. An ACE inhibitor/angiotensin II receptor blocker is not being taken. Eye exam is current.   BP was 128/76 at work earlier today. She reports that she usually has high blood pressures when she comes here. She reports a history of whitecoat hypertension.  Carpal tunnel syndrome-she says she has severe carpal tunnel syndrome and really does not want to have the surgery. She has been trying some samples of Lyrica at work and says it actually has been very helpful for her. She would like me to write a prescription for the 50 mg tablets.  She has a history of eczema and says she has a couple of locations they're flaring right now including her left antecubital fossa, behind her right knee and some on her right forearm. She has mostly been using moisturizers and has only been helping minimally. She has used topical steroids in the past which really worked well for her.   Review of Systems     Objective:   Physical Exam  Constitutional: She is oriented to person, place, and time. She appears well-developed and well-nourished.  HENT:  Head: Normocephalic and atraumatic.  Cardiovascular: Normal rate, regular rhythm and normal heart sounds.   Pulmonary/Chest: Effort normal and breath sounds normal.  Neurological: She is alert and oriented to person, place, and time.  Skin: Skin is warm and dry.  Psychiatric: She has a normal mood and affect. Her behavior  is normal.   The left antecubital fossa, and behind her right knee as well as her right forearm her near her right antecubital fossa she has dry scaling skin with some excoriations. No active drainage or infection. The appearance is consistent with eczema.       Assessment & Plan:  DM- She prefers to useher labs at work because it is free. Given lab slip for CMP. Lipid, A1C, urine micro. Will call with results. Based on home sugars seems well controlled. Foot exam normal today. Home BPs and at work are well controlled. Follow up in 3 months.  Elevated blood pressure-her blood pressure was just about for diabetic foot based on her blood pressure readings at work she seems to be well-controlled. She is not currently on any blood pressure medications.  Carpal tunnel syndrome-new diagnosis. we can certainly start the Lyrica since she has tried in and seems very helpful for her. We will start with the 50 mg tablets twice a day.  Eczema-new diagnosis. sent prescription for triamcinolone cream to be used. I did one about skin tenting over time and caregivers can break once the area clears. As a reminder to make sure she is moisturizing frequently.

## 2011-03-26 DIAGNOSIS — G5600 Carpal tunnel syndrome, unspecified upper limb: Secondary | ICD-10-CM | POA: Insufficient documentation

## 2011-03-26 DIAGNOSIS — L309 Dermatitis, unspecified: Secondary | ICD-10-CM | POA: Insufficient documentation

## 2011-03-26 MED ORDER — TRIAMCINOLONE ACETONIDE 0.5 % EX OINT: TOPICAL_OINTMENT | Freq: Every day | CUTANEOUS | Status: DC

## 2011-03-27 ENCOUNTER — Other Ambulatory Visit: Payer: Self-pay | Admitting: *Deleted

## 2011-03-27 MED ORDER — TRETINOIN 0.025 % EX CREA: TOPICAL_CREAM | Freq: Every day | CUTANEOUS | Status: AC

## 2011-04-02 ENCOUNTER — Telehealth (HOSPITAL_COMMUNITY): Payer: Self-pay

## 2011-04-02 NOTE — Telephone Encounter (Signed)
Return phone call. When voice mail. Left message for patient to call back tomorrow.

## 2011-04-02 NOTE — Telephone Encounter (Signed)
Pt would like to move march appt to sooner or speak with you on the phone she would like you to write a letter to her employer about her anxiety attacks and adhd. She will be available to speak on the phone at 3pm and after.

## 2011-04-03 ENCOUNTER — Encounter (HOSPITAL_COMMUNITY): Payer: Self-pay | Admitting: Psychiatry

## 2011-05-02 ENCOUNTER — Telehealth (HOSPITAL_COMMUNITY): Payer: Self-pay

## 2011-05-02 MED ORDER — LISDEXAMFETAMINE DIMESYLATE 70 MG PO CAPS: 70.0000 mg | ORAL_CAPSULE | ORAL | Status: DC

## 2011-05-02 NOTE — Telephone Encounter (Signed)
Needs rx for vyvanse. Leaves Monday for [place] and only has 4 left. Has appt for 3/22

## 2011-05-16 ENCOUNTER — Ambulatory Visit (INDEPENDENT_AMBULATORY_CARE_PROVIDER_SITE_OTHER): Payer: BC Managed Care – PPO | Admitting: Psychiatry

## 2011-05-16 VITALS — BP 122/80 | Ht 62.75 in | Wt 152.0 lb

## 2011-05-16 DIAGNOSIS — F39 Unspecified mood [affective] disorder: Secondary | ICD-10-CM

## 2011-05-16 DIAGNOSIS — F900 Attention-deficit hyperactivity disorder, predominantly inattentive type: Secondary | ICD-10-CM

## 2011-05-16 DIAGNOSIS — F411 Generalized anxiety disorder: Secondary | ICD-10-CM

## 2011-05-16 DIAGNOSIS — F909 Attention-deficit hyperactivity disorder, unspecified type: Secondary | ICD-10-CM

## 2011-05-16 MED ORDER — LAMOTRIGINE 100 MG PO TABS: 100.0000 mg | ORAL_TABLET | Freq: Every day | ORAL | Status: DC

## 2011-05-16 MED ORDER — LISDEXAMFETAMINE DIMESYLATE 70 MG PO CAPS: 70.0000 mg | ORAL_CAPSULE | ORAL | Status: DC

## 2011-05-16 MED ORDER — LAMOTRIGINE 25 MG PO TABS: ORAL_TABLET | ORAL | Status: DC

## 2011-05-17 ENCOUNTER — Other Ambulatory Visit: Payer: Self-pay | Admitting: Family Medicine

## 2011-05-19 ENCOUNTER — Encounter (HOSPITAL_COMMUNITY): Payer: Self-pay | Admitting: Psychiatry

## 2011-05-19 NOTE — Progress Notes (Signed)
    Health Follow-up Outpatient Visit  [place] Sep 07, 1970   Subjective: The patient is a 42 year old female who has been followed by [facility] since April 2012. She has been diagnosed with generalized anxiety disorder along with ADHD. At last appointment, I increased her Vyvanse to 70 mg daily. She also continues to take Ambien, Xanax, and Wellbutrin XL. The patient reports her job seems to be going well. She's had more mood fluctuations. She reports her mood comes and goes. She has a lot of highs and lows. She reports that changes daily. She is sleeping well with the Ambien. Xanax appears to be controlling her anxiety. Focus is relatively good with Vyvanse, but focuses also related to mood. She denies any suicidal thoughts.  Filed Vitals:   05/19/11 0854  BP: 122/80    Mental Status Examination  Appearance: Casual Alert: Yes Attention: good  Cooperative: Yes Eye Contact: Good Speech: Regular rhythm and volume Psychomotor Activity: Normal Memory/Concentration: Intact Oriented: person, place, time/date and situation Mood: Euthymic Affect: Appropriate Thought Processes and Associations: Logical Fund of Knowledge: Fair Thought Content: No suicidal homicidal thoughts Insight: Fair Judgement: Fair  Diagnosis: ADHD inattentive type, generalized anxiety disorder, mood disorder NOS  Treatment Plan: Start Lamictal 25 mg daily for 2 weeks then increase to 50 mg daily for 2 weeks I have also written additional prescription 100 mg after 4 weeks. We will continue all her other medication. I will see her back in 5 weeks. Patient to call with concerns.  Jamse Mead, MD

## 2011-06-12 ENCOUNTER — Telehealth (HOSPITAL_COMMUNITY): Payer: Self-pay | Admitting: Psychiatry

## 2011-06-12 NOTE — Telephone Encounter (Addendum)
Called patient back regarding her medications. Patient reports she has been doing well on Buproprion XL 150 mg TID as well as her current combination of medicaitons. She denies suicidal or homicidal ideation, intent, or plans. She denies any side effects from he medications.  PLAN: As patient has not been seen, have called patient as I would like to speak to her regarding her medications. According to pharmacy note she expects to pick her medications up on 06/16/11.

## 2011-06-12 NOTE — Telephone Encounter (Signed)
Called patient regarding medications.

## 2011-06-16 ENCOUNTER — Other Ambulatory Visit (HOSPITAL_COMMUNITY): Payer: Self-pay | Admitting: Psychiatry

## 2011-06-16 NOTE — Telephone Encounter (Addendum)
The pharmacy sent a fax request for Wellbutrin XL 150 mg, three tablets daily. Discussed medications with patient's provider. I called the patient but as I was unable to reach her left her a message Patient was initially on Wellbutrin raised to highest dose, but was later started on Vyvanse due to poblems with focusing. The patient reports she has enough Wellbutrin to last til her next appointment on 06/18/11.  The patient was informed about significant drug interactions between Wellbutrin and Vyvanse. The patient reports that she has done better with this combination, and denies any side effects,  and is adamant about not changing medications.  I explained to the patient about the need for random urine drug screens with Schedule 2 medications.  She states she would like to have her care through Dr. Christell Constant, and would only like to speak to her about medication changes. Informed patient's provider about the same.

## 2011-06-18 ENCOUNTER — Other Ambulatory Visit (HOSPITAL_COMMUNITY): Payer: Self-pay | Admitting: Psychiatry

## 2011-06-18 ENCOUNTER — Ambulatory Visit (INDEPENDENT_AMBULATORY_CARE_PROVIDER_SITE_OTHER): Payer: BC Managed Care – PPO | Admitting: Psychiatry

## 2011-06-18 ENCOUNTER — Encounter (HOSPITAL_COMMUNITY): Payer: Self-pay | Admitting: Psychiatry

## 2011-06-18 VITALS — BP 142/85 | Ht 62.75 in | Wt 150.0 lb

## 2011-06-18 DIAGNOSIS — F909 Attention-deficit hyperactivity disorder, unspecified type: Secondary | ICD-10-CM

## 2011-06-18 DIAGNOSIS — F411 Generalized anxiety disorder: Secondary | ICD-10-CM

## 2011-06-18 MED ORDER — LISDEXAMFETAMINE DIMESYLATE 70 MG PO CAPS: 70.0000 mg | ORAL_CAPSULE | ORAL | Status: DC

## 2011-06-18 MED ORDER — BUPROPION HCL ER (XL) 150 MG PO TB24: 450.0000 mg | ORAL_TABLET | ORAL | Status: DC

## 2011-06-18 NOTE — Progress Notes (Signed)
   [facility] Follow-up Outpatient Visit  [place] 1970-11-10   Subjective: The patient is a 41 year old female who has been followed by [facility] since April 2012. She has been diagnosed with generalized anxiety disorder along with ADHD. At her last appointment, I continued her Xanax, Ambien, Wellbutrin XL, and Vyvanse. I added Lamictal to help with mood symptoms. The patient presents today. She quit her job week and half ago. He is currently not working, but looking for work. She feels much less moody with the Lamictal. She feels that she is doing well. She is under a lot of stress with all these changes in her life. She is sleeping well. She reports that she's been out walking more with the dogs. The patient was made aware of possible drug interactions between Vyvanse and Wellbutrin XL which can cause increased risk of seizures. Patient reports she has had no trouble with it, and has tried to come off Wellbutrin before. She is not comfortable coming off it right now, and reports that she is aware of the interaction.  Filed Vitals:   06/18/11 1336  BP: 142/85    Mental Status Examination  Appearance: Casual Alert: Yes Attention: good  Cooperative: Yes Eye Contact: Good Speech: Regular rhythm and volume Psychomotor Activity: Normal Memory/Concentration: Intact Oriented: person, place, time/date and situation Mood: Euthymic Affect: Appropriate Thought Processes and Associations: Logical Fund of Knowledge: Fair Thought Content: No suicidal homicidal thoughts Insight: Fair Judgement: Fair  Diagnosis: ADHD inattentive type, generalized anxiety disorder, mood disorder NOS  Treatment Plan: We will not make any changes today. We will order a urine drug screen. I will see the patient back in one month. Jamse Mead, MD

## 2011-06-19 LAB — DRUGS OF ABUSE SCREEN W/O ALC, ROUTINE URINE
Cocaine Metabolites: NEGATIVE
Creatinine,U: 148.8 mg/dL
Marijuana Metabolite: NEGATIVE
Opiate Screen, Urine: NEGATIVE
Phencyclidine (PCP): NEGATIVE

## 2011-06-21 LAB — AMPHETAMINES URINE CONFIRMATION
MDEA: NEGATIVE NG/ML
Methamphetamine GC/MS, Ur: NEGATIVE NG/ML

## 2011-06-21 LAB — BENZODIAZEPINE, QUANTITATIVE, URINE
Alprazolam (GC/LC/MS), ur confirm: 169 NG/ML — ABNORMAL HIGH
Flurazepam Metabolite: NEGATIVE NG/ML
Nordiazepam GC/MS Conf: NEGATIVE NG/ML
Temazapam: NEGATIVE NG/ML

## 2011-06-23 ENCOUNTER — Ambulatory Visit: Payer: Self-pay | Admitting: Family Medicine

## 2011-06-23 DIAGNOSIS — Z0289 Encounter for other administrative examinations: Secondary | ICD-10-CM

## 2011-07-01 ENCOUNTER — Other Ambulatory Visit: Payer: Self-pay | Admitting: Obstetrics & Gynecology

## 2011-07-01 DIAGNOSIS — Z1231 Encounter for screening mammogram for malignant neoplasm of breast: Secondary | ICD-10-CM

## 2011-07-08 ENCOUNTER — Inpatient Hospital Stay: Admission: RE | Admit: 2011-07-08 | Payer: Self-pay | Source: Ambulatory Visit

## 2011-07-15 ENCOUNTER — Other Ambulatory Visit (HOSPITAL_COMMUNITY): Payer: Self-pay | Admitting: Psychiatry

## 2011-07-15 DIAGNOSIS — F4310 Post-traumatic stress disorder, unspecified: Secondary | ICD-10-CM

## 2011-07-15 MED ORDER — ZOLPIDEM TARTRATE ER 12.5 MG PO TBCR: 12.5000 mg | EXTENDED_RELEASE_TABLET | Freq: Every evening | ORAL | Status: DC | PRN

## 2011-07-16 ENCOUNTER — Ambulatory Visit (INDEPENDENT_AMBULATORY_CARE_PROVIDER_SITE_OTHER): Payer: BC Managed Care – PPO | Admitting: Obstetrics & Gynecology

## 2011-07-16 DIAGNOSIS — Z01419 Encounter for gynecological examination (general) (routine) without abnormal findings: Secondary | ICD-10-CM

## 2011-07-16 DIAGNOSIS — Z0000 Encounter for general adult medical examination without abnormal findings: Secondary | ICD-10-CM
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
## 2011-07-18 ENCOUNTER — Ambulatory Visit (INDEPENDENT_AMBULATORY_CARE_PROVIDER_SITE_OTHER): Payer: BC Managed Care – PPO | Admitting: Psychiatry

## 2011-07-18 ENCOUNTER — Encounter (HOSPITAL_COMMUNITY): Payer: Self-pay | Admitting: Psychiatry

## 2011-07-18 VITALS — BP 132/82 | Ht 62.76 in | Wt 154.0 lb

## 2011-07-18 DIAGNOSIS — F909 Attention-deficit hyperactivity disorder, unspecified type: Secondary | ICD-10-CM

## 2011-07-18 DIAGNOSIS — F411 Generalized anxiety disorder: Secondary | ICD-10-CM

## 2011-07-18 MED ORDER — LISDEXAMFETAMINE DIMESYLATE 70 MG PO CAPS: 70.0000 mg | ORAL_CAPSULE | ORAL | Status: DC

## 2011-07-18 MED ORDER — LAMOTRIGINE 25 MG PO TABS: 50.0000 mg | ORAL_TABLET | Freq: Every day | ORAL | Status: DC

## 2011-07-18 NOTE — Progress Notes (Signed)
   [facility] Follow-up Outpatient Visit  [place] 01/22/1971   Subjective: The patient is a 41 year old female who has been followed by [facility] since April 2012. She has been diagnosed with generalized anxiety disorder along with ADHD. At her last appointment, I continued her Xanax, Ambien, Wellbutrin XL, Lamictal and Vyvanse. She had just quit her job. She also turned incorporation into [employer]. 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 She is very upset about this. 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 The patient is continued to look for work. She has been more irritable and mad, mainly regarding her past work situation. She is asking if we can go up on the Lamictal. She endorses good sleep and appetite. She feels that her anxiety is under control. She has more anger than anything else.  Filed Vitals:   07/18/11 1438  BP: 132/82    Mental Status Examination  Appearance: Casual Alert: Yes Attention: good  Cooperative: Yes Eye Contact: Good Speech: Regular rhythm and volume Psychomotor Activity: Normal Memory/Concentration: Intact Oriented: person, place, time/date and situation Mood: Euthymic Affect: Appropriate Thought Processes and Associations: Logical Fund of Knowledge: Fair Thought Content: No suicidal homicidal thoughts Insight: Fair Judgement: Fair  Diagnosis: ADHD inattentive type, generalized anxiety disorder, mood disorder NOS  Treatment Plan: I will increase Lamictal to 150 mg.  patient just filled 100 milligrams. I will send a prescription for 50 mg to the pharmacy for one month. Patient to call when out, and I will call in 150 mg. We will continue all else. I will see the patient back in one month. Jamse Mead, MD

## 2011-07-22 ENCOUNTER — Ambulatory Visit: Payer: Self-pay | Admitting: Family Medicine

## 2011-07-22 DIAGNOSIS — Z0289 Encounter for other administrative examinations: Secondary | ICD-10-CM

## 2011-07-29 ENCOUNTER — Inpatient Hospital Stay: Admission: RE | Admit: 2011-07-29 | Payer: Self-pay | Source: Ambulatory Visit

## 2011-08-06 ENCOUNTER — Other Ambulatory Visit: Payer: Self-pay | Admitting: *Deleted

## 2011-08-06 ENCOUNTER — Telehealth: Payer: Self-pay | Admitting: Family Medicine

## 2011-08-06 MED ORDER — INSULIN PEN NEEDLE 32G X 5 MM MISC: Status: DC

## 2011-08-06 NOTE — Telephone Encounter (Signed)
Patient called left message just layed off and has no insurance until after 08/28/11 and would like to know if she can get a refill for novolog she will schedule an appt to see Dr. Judie Petit after July 4th when her insurance goes into affect.[name] KitchenMarland Kitchen

## 2011-08-06 NOTE — Telephone Encounter (Signed)
Medication sent.

## 2011-08-13 ENCOUNTER — Other Ambulatory Visit (HOSPITAL_COMMUNITY): Payer: Self-pay | Admitting: Psychiatry

## 2011-08-13 ENCOUNTER — Emergency Department
Admission: EM | Admit: 2011-08-13 | Discharge: 2011-08-13 | Disposition: A | Discharge status: Home / Self Care | Payer: BC Managed Care – PPO | Source: Home / Self Care | Attending: Emergency Medicine | Admitting: Emergency Medicine

## 2011-08-13 ENCOUNTER — Encounter: Payer: Self-pay | Admitting: Emergency Medicine

## 2011-08-13 DIAGNOSIS — J029 Acute pharyngitis, unspecified: Secondary | ICD-10-CM

## 2011-08-13 DIAGNOSIS — J309 Allergic rhinitis, unspecified: Secondary | ICD-10-CM

## 2011-08-13 DIAGNOSIS — J069 Acute upper respiratory infection, unspecified: Secondary | ICD-10-CM

## 2011-08-13 MED ORDER — AZITHROMYCIN 250 MG PO TABS: ORAL_TABLET | ORAL | Status: AC

## 2011-08-13 MED ORDER — FLUTICASONE PROPIONATE 50 MCG/ACT NA SUSP: 2.0000 | Freq: Every day | NASAL | Status: DC

## 2011-08-13 MED ORDER — LAMOTRIGINE 150 MG PO TABS: 150.0000 mg | ORAL_TABLET | Freq: Every day | ORAL | Status: DC

## 2011-08-13 MED ORDER — ALBUTEROL SULFATE HFA 108 (90 BASE) MCG/ACT IN AERS: 1.0000 | INHALATION_SPRAY | Freq: Four times a day (QID) | RESPIRATORY_TRACT | Status: DC | PRN
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
## 2011-08-13 NOTE — ED Notes (Signed)
Sinus Problems, wheezing, coughing, nose bleeds, ear pain, x 2 weeks

## 2011-08-15 ENCOUNTER — Other Ambulatory Visit (HOSPITAL_COMMUNITY): Payer: Self-pay | Admitting: Psychiatry

## 2011-08-17 ENCOUNTER — Telehealth: Payer: Self-pay | Admitting: Family Medicine

## 2011-08-17 NOTE — Telephone Encounter (Signed)
Message copied by Royetta Asal on Sun Aug 17, 2011  6:28 PM ------      Message from: Donna Christen A      Created: Sun Aug 17, 2011  6:07 PM      Regarding: RE: another antibiotic?       May call in Rx for Levaquin 500mg , one tab by mouth once daily for one week.  Rx #7, no ref.      ----- Message -----         From: Royetta Asal, RN         Sent: 08/17/2011   5:03 PM           To: Lattie Haw, MD      Subject: another antibiotic?                                      Pt. On z-pack, albuterol and flonase since Friday; not improving and was told another ABX could be ordered (going on vacation this week); mentioned Levaquin.

## 2011-08-19 ENCOUNTER — Inpatient Hospital Stay: Admission: RE | Admit: 2011-08-19 | Payer: Self-pay | Source: Ambulatory Visit

## 2011-08-20 ENCOUNTER — Encounter: Payer: Self-pay | Admitting: Obstetrics & Gynecology

## 2011-08-20 ENCOUNTER — Ambulatory Visit (INDEPENDENT_AMBULATORY_CARE_PROVIDER_SITE_OTHER): Payer: BC Managed Care – PPO | Admitting: Obstetrics & Gynecology

## 2011-08-20 VITALS — BP 132/81 | HR 106 | Temp 98.5°F | Resp 16 | Ht 64.0 in | Wt 154.0 lb

## 2011-08-20 DIAGNOSIS — Z0000 Encounter for general adult medical examination without abnormal findings: Secondary | ICD-10-CM

## 2011-08-20 DIAGNOSIS — Z113 Encounter for screening for infections with a predominantly sexual mode of transmission: Secondary | ICD-10-CM

## 2011-08-20 DIAGNOSIS — Z124 Encounter for screening for malignant neoplasm of cervix: Secondary | ICD-10-CM

## 2011-08-20 MED ORDER — FLUCONAZOLE 150 MG PO TABS: 150.0000 mg | ORAL_TABLET | Freq: Once | ORAL | Status: AC
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
## 2011-08-29 ENCOUNTER — Ambulatory Visit (HOSPITAL_COMMUNITY): Payer: Self-pay | Admitting: Psychiatry

## 2011-09-01 ENCOUNTER — Other Ambulatory Visit: Payer: Self-pay | Admitting: Family Medicine

## 2011-09-03 ENCOUNTER — Encounter (HOSPITAL_COMMUNITY): Payer: Self-pay | Admitting: Psychiatry

## 2011-09-03 ENCOUNTER — Ambulatory Visit (INDEPENDENT_AMBULATORY_CARE_PROVIDER_SITE_OTHER): Payer: BC Managed Care – PPO | Admitting: Psychiatry

## 2011-09-03 VITALS — BP 125/82 | Ht 62.75 in | Wt 154.0 lb

## 2011-09-03 DIAGNOSIS — F39 Unspecified mood [affective] disorder: Secondary | ICD-10-CM

## 2011-09-03 DIAGNOSIS — F411 Generalized anxiety disorder: Secondary | ICD-10-CM

## 2011-09-03 DIAGNOSIS — F909 Attention-deficit hyperactivity disorder, unspecified type: Secondary | ICD-10-CM

## 2011-09-03 MED ORDER — LISDEXAMFETAMINE DIMESYLATE 70 MG PO CAPS: 70.0000 mg | ORAL_CAPSULE | ORAL | Status: DC
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
## 2011-09-15 ENCOUNTER — Other Ambulatory Visit: Payer: Self-pay | Admitting: Obstetrics & Gynecology

## 2011-09-15 DIAGNOSIS — Z139 Encounter for screening, unspecified: Secondary | ICD-10-CM

## 2011-09-16 ENCOUNTER — Other Ambulatory Visit (HOSPITAL_COMMUNITY): Payer: Self-pay | Admitting: Psychiatry

## 2011-09-16 ENCOUNTER — Ambulatory Visit: Payer: BC Managed Care – PPO

## 2011-09-16 DIAGNOSIS — Z139 Encounter for screening, unspecified: Secondary | ICD-10-CM

## 2011-09-18 ENCOUNTER — Ambulatory Visit (INDEPENDENT_AMBULATORY_CARE_PROVIDER_SITE_OTHER): Payer: BC Managed Care – PPO | Admitting: Obstetrics & Gynecology

## 2011-09-18 ENCOUNTER — Other Ambulatory Visit (HOSPITAL_COMMUNITY): Payer: Self-pay | Admitting: Psychiatry

## 2011-09-18 ENCOUNTER — Encounter: Payer: Self-pay | Admitting: Obstetrics & Gynecology

## 2011-09-18 VITALS — BP 152/88 | HR 105 | Ht 64.0 in | Wt 155.0 lb

## 2011-09-18 DIAGNOSIS — N949 Unspecified condition associated with female genital organs and menstrual cycle: Secondary | ICD-10-CM

## 2011-09-18 DIAGNOSIS — R102 Pelvic and perineal pain: Secondary | ICD-10-CM

## 2011-09-18 MED ORDER — HYDROCODONE-ACETAMINOPHEN 5-325 MG PO TABS: 1.0000 | ORAL_TABLET | ORAL | Status: DC

## 2011-09-18 MED ORDER — HYDROCODONE-ACETAMINOPHEN 7.5-325 MG PO TABS: 1.0000 | ORAL_TABLET | Freq: Four times a day (QID) | ORAL | Status: AC | PRN

## 2011-09-18 NOTE — Progress Notes (Signed)
  Subjective:    Patient ID: Melissa [name], female    DOB: 03-12-1970, 41 y.o.   MRN: [id number]  HPI  She complains of a 4 day h/o moderately severe RLQ pain. She has not had sex as her husband is out of town. She has a Mirena in. She denies fevers/abnormal discharge/sex outside of her marriage.  Review of Systems Pap and cervical cultures negative 08/17/11    Objective:   Physical Exam  Moderately tender adnexa, right worse than left     Assessment & Plan:  Probable ovarian cyst- schedule u/s Norco #30 with no refills.

## 2011-09-19 ENCOUNTER — Ambulatory Visit (INDEPENDENT_AMBULATORY_CARE_PROVIDER_SITE_OTHER): Payer: BC Managed Care – PPO

## 2011-09-19 DIAGNOSIS — R102 Pelvic and perineal pain: Secondary | ICD-10-CM

## 2011-09-19 DIAGNOSIS — N83209 Unspecified ovarian cyst, unspecified side: Secondary | ICD-10-CM

## 2011-09-19 DIAGNOSIS — Z975 Presence of (intrauterine) contraceptive device: Secondary | ICD-10-CM

## 2011-09-24 ENCOUNTER — Ambulatory Visit: Payer: Self-pay | Admitting: Family Medicine

## 2011-09-24 DIAGNOSIS — Z0289 Encounter for other administrative examinations: Secondary | ICD-10-CM

## 2011-09-30 ENCOUNTER — Ambulatory Visit: Payer: Self-pay | Admitting: Obstetrics & Gynecology

## 2011-09-30 DIAGNOSIS — R10813 Right lower quadrant abdominal tenderness: Secondary | ICD-10-CM

## 2011-10-02 ENCOUNTER — Other Ambulatory Visit (HOSPITAL_COMMUNITY): Payer: Self-pay | Admitting: Psychiatry

## 2011-10-13 ENCOUNTER — Other Ambulatory Visit (HOSPITAL_COMMUNITY): Payer: Self-pay | Admitting: Psychiatry

## 2011-10-13 DIAGNOSIS — F4310 Post-traumatic stress disorder, unspecified: Secondary | ICD-10-CM

## 2011-10-13 MED ORDER — ZOLPIDEM TARTRATE ER 12.5 MG PO TBCR: 12.5000 mg | EXTENDED_RELEASE_TABLET | Freq: Every evening | ORAL | Status: DC | PRN

## 2011-11-04 ENCOUNTER — Encounter (HOSPITAL_COMMUNITY): Payer: Self-pay | Admitting: Psychiatry

## 2011-11-04 ENCOUNTER — Ambulatory Visit (INDEPENDENT_AMBULATORY_CARE_PROVIDER_SITE_OTHER): Payer: BC Managed Care – PPO | Admitting: Psychiatry

## 2011-11-04 VITALS — BP 126/84 | Ht 62.5 in | Wt 155.0 lb

## 2011-11-04 DIAGNOSIS — F39 Unspecified mood [affective] disorder: Secondary | ICD-10-CM

## 2011-11-04 DIAGNOSIS — F988 Other specified behavioral and emotional disorders with onset usually occurring in childhood and adolescence: Secondary | ICD-10-CM

## 2011-11-04 DIAGNOSIS — F411 Generalized anxiety disorder: Secondary | ICD-10-CM

## 2011-11-04 DIAGNOSIS — F909 Attention-deficit hyperactivity disorder, unspecified type: Secondary | ICD-10-CM

## 2011-11-04 MED ORDER — LISDEXAMFETAMINE DIMESYLATE 70 MG PO CAPS: 70.0000 mg | ORAL_CAPSULE | ORAL | Status: DC

## 2011-11-04 MED ORDER — LAMOTRIGINE 100 MG PO TABS: 100.0000 mg | ORAL_TABLET | Freq: Two times a day (BID) | ORAL | Status: DC

## 2011-11-04 NOTE — Progress Notes (Addendum)
   [facility] Follow-up Outpatient Visit  Nayeliz [place] 21-Mar-1970   Subjective: The patient is a 41 year old female who has been followed by [facility] since April 2012. She has been diagnosed with generalized anxiety disorder along with ADHD. At her last appointment, I did not make any changes. She presents today. They are going to mediation on October 3. The patient reports she has been more moody. She is staying up half a night and spending the day in pajamas. She has not found another job. She and her husband are running out of money. She is not sleeping well even with the Ambien. She denies any motivation. She is more irritable. Appetite is good. She is looking forward to this being over.  Filed Vitals:   11/04/11 1422  BP: 126/84    Mental Status Examination  Appearance: Casual Alert: Yes Attention: good  Cooperative: Yes Eye Contact: Good Speech: Regular rhythm and volume Psychomotor Activity: Normal Memory/Concentration: Intact Oriented: person, place, time/date and situation Mood: Euthymic Affect: Appropriate Thought Processes and Associations: Logical Fund of Knowledge: Fair Thought Content: No suicidal homicidal thoughts Insight: Fair Judgement: Fair  Diagnosis: ADHD inattentive type, generalized anxiety disorder, mood disorder NOS  Treatment Plan: I will increase Lamictal to 100 mg twice a day. I will continue her Xanax, Wellbutrin XL, Vyvanse, and Ambien CR. I will see the patient back in 6 weeks. Jamse Mead, MD

## 2011-11-08 ENCOUNTER — Other Ambulatory Visit: Payer: Self-pay | Admitting: Family Medicine

## 2011-11-12 ENCOUNTER — Telehealth (HOSPITAL_COMMUNITY): Payer: Self-pay

## 2011-11-12 NOTE — Telephone Encounter (Signed)
Last 2-3 days emotional roller coaster.  We'll not change anything currently. If things get bad, increase Lamictal to 1-1/2 twice a day. Called me know.

## 2011-12-12 ENCOUNTER — Other Ambulatory Visit: Payer: Self-pay | Admitting: Family Medicine

## 2011-12-12 ENCOUNTER — Other Ambulatory Visit (HOSPITAL_COMMUNITY): Payer: Self-pay | Admitting: Psychiatry

## 2011-12-12 MED ORDER — BUPROPION HCL ER (XL) 150 MG PO TB24: 450.0000 mg | ORAL_TABLET | ORAL | Status: DC

## 2011-12-16 ENCOUNTER — Encounter (HOSPITAL_COMMUNITY): Payer: Self-pay | Admitting: Psychiatry

## 2011-12-16 NOTE — Progress Notes (Signed)
This encounter was created in error - please disregard.

## 2011-12-23 ENCOUNTER — Other Ambulatory Visit: Payer: Self-pay | Admitting: *Deleted

## 2011-12-23 ENCOUNTER — Other Ambulatory Visit: Payer: Self-pay | Admitting: Family Medicine

## 2011-12-23 MED ORDER — INSULIN PEN NEEDLE 32G X 5 MM MISC: Status: DC

## 2011-12-24 ENCOUNTER — Other Ambulatory Visit (HOSPITAL_COMMUNITY): Payer: Self-pay | Admitting: Psychiatry

## 2011-12-24 ENCOUNTER — Telehealth: Payer: Self-pay | Admitting: *Deleted

## 2011-12-24 MED ORDER — ALPRAZOLAM 1 MG PO TABS: 1.0000 mg | ORAL_TABLET | Freq: Three times a day (TID) | ORAL | Status: DC

## 2011-12-24 NOTE — Telephone Encounter (Signed)
Pt scheduled appt with you and wants to know if you will give her some Lasix 3 tabs until her appt- having swelling and also wants the Glucagon pens because of her sugar. States has been on prednisone and thinks its causing these issues

## 2011-12-24 NOTE — Telephone Encounter (Signed)
I would not call in any Lasix or glucagon pens for her until her appointment. She is a diabetic and has not seen me since January. She is well overdue. I certainly don't know why she swelling and don't know what her kidney function is doing so it's not safe to prescribe a diuretic without knowing exactly what's going on.

## 2011-12-25 ENCOUNTER — Ambulatory Visit (INDEPENDENT_AMBULATORY_CARE_PROVIDER_SITE_OTHER): Payer: BC Managed Care – PPO | Admitting: Sports Medicine

## 2011-12-25 ENCOUNTER — Telehealth: Payer: Self-pay | Admitting: Family Medicine

## 2011-12-25 ENCOUNTER — Encounter: Payer: Self-pay | Admitting: Sports Medicine

## 2011-12-25 VITALS — Temp 97.9°F | Wt 162.0 lb

## 2011-12-25 DIAGNOSIS — J301 Allergic rhinitis due to pollen: Secondary | ICD-10-CM

## 2011-12-25 DIAGNOSIS — S81809A Unspecified open wound, unspecified lower leg, initial encounter: Secondary | ICD-10-CM | POA: Insufficient documentation

## 2011-12-25 DIAGNOSIS — S81009A Unspecified open wound, unspecified knee, initial encounter: Secondary | ICD-10-CM

## 2011-12-25 DIAGNOSIS — S91009A Unspecified open wound, unspecified ankle, initial encounter: Secondary | ICD-10-CM

## 2011-12-25 DIAGNOSIS — E119 Type 2 diabetes mellitus without complications: Secondary | ICD-10-CM

## 2011-12-25 MED ORDER — FUROSEMIDE 40 MG PO TABS: 40.0000 mg | ORAL_TABLET | Freq: Every day | ORAL | Status: DC

## 2011-12-25 MED ORDER — CIPROFLOXACIN HCL 750 MG PO TABS: 750.0000 mg | ORAL_TABLET | Freq: Two times a day (BID) | ORAL | Status: DC

## 2011-12-25 MED ORDER — GLUCAGON (RDNA) 1 MG IJ KIT: PACK | INTRAMUSCULAR | Status: DC

## 2011-12-25 MED ORDER — AZELASTINE HCL 0.1 % NA SOLN: 1.0000 | Freq: Two times a day (BID) | NASAL | Status: DC

## 2011-12-25 MED ORDER — HYDROCODONE-ACETAMINOPHEN 5-325 MG PO TABS: 1.0000 | ORAL_TABLET | Freq: Four times a day (QID) | ORAL | Status: DC | PRN

## 2011-12-25 MED ORDER — DOXYCYCLINE HYCLATE 100 MG PO TABS: 100.0000 mg | ORAL_TABLET | Freq: Two times a day (BID) | ORAL | Status: AC

## 2011-12-25 MED ORDER — METRONIDAZOLE 500 MG PO TABS: 500.0000 mg | ORAL_TABLET | Freq: Two times a day (BID) | ORAL | Status: DC

## 2011-12-25 MED ORDER — FLUCONAZOLE 150 MG PO TABS: 150.0000 mg | ORAL_TABLET | Freq: Once | ORAL | Status: DC

## 2011-12-25 NOTE — Telephone Encounter (Signed)
I am certainly happy to consult on neuromusculoskeletal pathology, as well as wound care, she does have a long history with Dr. Linford Arnold, and seems to have a very solid therapeutic relationship.  I would recommend she continue her primary care with Dr. Linford Arnold and I will continue to consult for wound care and neuromusculoskeletal issues.

## 2011-12-25 NOTE — Telephone Encounter (Signed)
Sounds like a good plan

## 2011-12-25 NOTE — Assessment & Plan Note (Signed)
Patient desires glucagon as she has had some hypoglycemic episodes.
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
## 2011-12-25 NOTE — Telephone Encounter (Signed)
Patient seen Dr.Thekkekandem for sport med today and now she is requesting to switch her PCP from Dr. Linford Arnold to Dr. Briant Sites. Please advise what you would like to do? Thanks, Michaelle Copas
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
## 2011-12-26 ENCOUNTER — Ambulatory Visit: Payer: Self-pay | Admitting: Sports Medicine

## 2011-12-26 ENCOUNTER — Ambulatory Visit: Payer: Self-pay | Admitting: Family Medicine

## 2011-12-26 ENCOUNTER — Other Ambulatory Visit: Payer: Self-pay | Admitting: *Deleted

## 2011-12-26 MED ORDER — AMBULATORY NON FORMULARY MEDICATION: Status: DC

## 2011-12-26 MED ORDER — PROMETHAZINE HCL 25 MG PO TABS: 25.0000 mg | ORAL_TABLET | Freq: Four times a day (QID) | ORAL | Status: DC | PRN

## 2011-12-26 NOTE — Telephone Encounter (Signed)
Rx for wound care items at front, cannot e-prescribe.

## 2011-12-26 NOTE — Telephone Encounter (Signed)
Pt notiifed via VM to pick up at front office

## 2011-12-26 NOTE — Telephone Encounter (Signed)
Pt calls and request refill on phenergan. States all the meds she is on is making her really nauseated. Also needs lancets sent to her pharmacy to test her blood sugar. [facility]

## 2012-01-01 ENCOUNTER — Ambulatory Visit (INDEPENDENT_AMBULATORY_CARE_PROVIDER_SITE_OTHER): Payer: BC Managed Care – PPO | Admitting: Sports Medicine

## 2012-01-01 ENCOUNTER — Ambulatory Visit (HOSPITAL_COMMUNITY): Payer: BC Managed Care – PPO | Admitting: Psychiatry

## 2012-01-01 ENCOUNTER — Encounter: Payer: Self-pay | Admitting: Sports Medicine

## 2012-01-01 VITALS — BP 140/78 | Temp 98.2°F | Wt 158.0 lb

## 2012-01-01 DIAGNOSIS — Z298 Encounter for other specified prophylactic measures: Secondary | ICD-10-CM

## 2012-01-01 DIAGNOSIS — S81809A Unspecified open wound, unspecified lower leg, initial encounter: Secondary | ICD-10-CM

## 2012-01-01 DIAGNOSIS — S81009A Unspecified open wound, unspecified knee, initial encounter: Secondary | ICD-10-CM

## 2012-01-01 DIAGNOSIS — Z23 Encounter for immunization: Secondary | ICD-10-CM

## 2012-01-01 NOTE — Assessment & Plan Note (Addendum)
Wound debridement as above. She's got another week of her triple antibiotic coverage. I'll see her back in one week for an additional wound debridement. I have asked her to talk to her primary care physician for medication refills, as well as her uncontrolled diabetes. Her uncontrolled blood sugars are certainly complicating healing of her wounds. Melissa [name] has not yet gotten her blood work done, and she declined getting it today as well. I emphasized the importance of following through with this.
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
## 2012-01-02 ENCOUNTER — Other Ambulatory Visit: Payer: Self-pay | Admitting: Sports Medicine

## 2012-01-08 ENCOUNTER — Ambulatory Visit: Payer: Self-pay | Admitting: Sports Medicine

## 2012-01-12 ENCOUNTER — Ambulatory Visit: Payer: Self-pay | Admitting: Sports Medicine

## 2012-01-13 ENCOUNTER — Other Ambulatory Visit (HOSPITAL_COMMUNITY): Payer: Self-pay | Admitting: Psychiatry

## 2012-01-13 ENCOUNTER — Encounter: Payer: Self-pay | Admitting: Sports Medicine

## 2012-01-13 ENCOUNTER — Ambulatory Visit (INDEPENDENT_AMBULATORY_CARE_PROVIDER_SITE_OTHER): Payer: BC Managed Care – PPO | Admitting: Sports Medicine

## 2012-01-13 VITALS — BP 148/80 | HR 109 | Temp 98.4°F | Resp 14 | Wt 156.0 lb

## 2012-01-13 DIAGNOSIS — S81809A Unspecified open wound, unspecified lower leg, initial encounter: Secondary | ICD-10-CM

## 2012-01-13 DIAGNOSIS — S81009A Unspecified open wound, unspecified knee, initial encounter: Secondary | ICD-10-CM

## 2012-01-13 NOTE — Assessment & Plan Note (Addendum)
Wound is now dry, and no longer draining. I debrided the wound as above. She will see me back in 2 weeks, I have informed her that I will not do any further debridements until she has seen her primary physician.
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
## 2012-01-15 ENCOUNTER — Ambulatory Visit: Payer: Self-pay | Admitting: Family Medicine

## 2012-01-26 ENCOUNTER — Ambulatory Visit: Payer: Self-pay | Admitting: Family Medicine

## 2012-01-27 ENCOUNTER — Other Ambulatory Visit (HOSPITAL_COMMUNITY): Payer: Self-pay | Admitting: Psychiatry

## 2012-01-27 ENCOUNTER — Ambulatory Visit: Payer: Self-pay | Admitting: Sports Medicine

## 2012-01-27 MED ORDER — LISDEXAMFETAMINE DIMESYLATE 70 MG PO CAPS: 70.0000 mg | ORAL_CAPSULE | ORAL | Status: DC

## 2012-01-27 MED ORDER — ALPRAZOLAM 1 MG PO TABS: 1.0000 mg | ORAL_TABLET | Freq: Three times a day (TID) | ORAL | Status: DC

## 2012-01-28 ENCOUNTER — Encounter (HOSPITAL_COMMUNITY): Payer: Self-pay

## 2012-02-04 ENCOUNTER — Inpatient Hospital Stay (HOSPITAL_BASED_OUTPATIENT_CLINIC_OR_DEPARTMENT_OTHER)
Admission: EM | Admit: 2012-02-04 | Discharge: 2012-02-09 | DRG: 550 | Disposition: A | Discharge status: Home Health | Payer: BC Managed Care – PPO | Attending: Internal Medicine | Admitting: Internal Medicine

## 2012-02-04 ENCOUNTER — Encounter (HOSPITAL_BASED_OUTPATIENT_CLINIC_OR_DEPARTMENT_OTHER): Payer: Self-pay | Admitting: *Deleted

## 2012-02-04 DIAGNOSIS — D649 Anemia, unspecified: Secondary | ICD-10-CM | POA: Diagnosis present

## 2012-02-04 DIAGNOSIS — M545 Low back pain: Secondary | ICD-10-CM

## 2012-02-04 DIAGNOSIS — L0390 Cellulitis, unspecified: Secondary | ICD-10-CM

## 2012-02-04 DIAGNOSIS — N183 Chronic kidney disease, stage 3 unspecified: Secondary | ICD-10-CM | POA: Diagnosis present

## 2012-02-04 DIAGNOSIS — E1165 Type 2 diabetes mellitus with hyperglycemia: Secondary | ICD-10-CM | POA: Diagnosis present

## 2012-02-04 DIAGNOSIS — L309 Dermatitis, unspecified: Secondary | ICD-10-CM

## 2012-02-04 DIAGNOSIS — F3289 Other specified depressive episodes: Secondary | ICD-10-CM | POA: Diagnosis present

## 2012-02-04 DIAGNOSIS — Z9119 Patient's noncompliance with other medical treatment and regimen: Secondary | ICD-10-CM

## 2012-02-04 DIAGNOSIS — Z8249 Family history of ischemic heart disease and other diseases of the circulatory system: Secondary | ICD-10-CM

## 2012-02-04 DIAGNOSIS — G4700 Insomnia, unspecified: Secondary | ICD-10-CM

## 2012-02-04 DIAGNOSIS — E1159 Type 2 diabetes mellitus with other circulatory complications: Principal | ICD-10-CM | POA: Diagnosis present

## 2012-02-04 DIAGNOSIS — Z823 Family history of stroke: Secondary | ICD-10-CM

## 2012-02-04 DIAGNOSIS — E162 Hypoglycemia, unspecified: Secondary | ICD-10-CM

## 2012-02-04 DIAGNOSIS — G5600 Carpal tunnel syndrome, unspecified upper limb: Secondary | ICD-10-CM

## 2012-02-04 DIAGNOSIS — F329 Major depressive disorder, single episode, unspecified: Secondary | ICD-10-CM | POA: Diagnosis present

## 2012-02-04 DIAGNOSIS — F4310 Post-traumatic stress disorder, unspecified: Secondary | ICD-10-CM | POA: Diagnosis present

## 2012-02-04 DIAGNOSIS — A480 Gas gangrene: Secondary | ICD-10-CM

## 2012-02-04 DIAGNOSIS — N058 Unspecified nephritic syndrome with other morphologic changes: Secondary | ICD-10-CM | POA: Diagnosis present

## 2012-02-04 DIAGNOSIS — Z833 Family history of diabetes mellitus: Secondary | ICD-10-CM

## 2012-02-04 DIAGNOSIS — F331 Major depressive disorder, recurrent, moderate: Secondary | ICD-10-CM | POA: Diagnosis present

## 2012-02-04 DIAGNOSIS — E669 Obesity, unspecified: Secondary | ICD-10-CM

## 2012-02-04 DIAGNOSIS — Z91199 Patient's noncompliance with other medical treatment and regimen due to unspecified reason: Secondary | ICD-10-CM

## 2012-02-04 DIAGNOSIS — J301 Allergic rhinitis due to pollen: Secondary | ICD-10-CM

## 2012-02-04 DIAGNOSIS — F411 Generalized anxiety disorder: Secondary | ICD-10-CM | POA: Diagnosis present

## 2012-02-04 DIAGNOSIS — F419 Anxiety disorder, unspecified: Secondary | ICD-10-CM | POA: Diagnosis present

## 2012-02-04 DIAGNOSIS — F909 Attention-deficit hyperactivity disorder, unspecified type: Secondary | ICD-10-CM | POA: Diagnosis present

## 2012-02-04 DIAGNOSIS — E1129 Type 2 diabetes mellitus with other diabetic kidney complication: Secondary | ICD-10-CM | POA: Diagnosis present

## 2012-02-04 DIAGNOSIS — I10 Essential (primary) hypertension: Secondary | ICD-10-CM

## 2012-02-04 DIAGNOSIS — S81809A Unspecified open wound, unspecified lower leg, initial encounter: Secondary | ICD-10-CM

## 2012-02-04 DIAGNOSIS — E119 Type 2 diabetes mellitus without complications: Secondary | ICD-10-CM

## 2012-02-04 DIAGNOSIS — Z880 Allergy status to penicillin: Secondary | ICD-10-CM
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
## 2012-02-05 ENCOUNTER — Encounter (HOSPITAL_BASED_OUTPATIENT_CLINIC_OR_DEPARTMENT_OTHER): Payer: Self-pay | Admitting: Emergency Medicine

## 2012-02-05 ENCOUNTER — Emergency Department (HOSPITAL_BASED_OUTPATIENT_CLINIC_OR_DEPARTMENT_OTHER): Payer: BC Managed Care – PPO

## 2012-02-05 DIAGNOSIS — E162 Hypoglycemia, unspecified: Secondary | ICD-10-CM

## 2012-02-05 DIAGNOSIS — F411 Generalized anxiety disorder: Secondary | ICD-10-CM

## 2012-02-05 DIAGNOSIS — S81009A Unspecified open wound, unspecified knee, initial encounter: Secondary | ICD-10-CM

## 2012-02-05 DIAGNOSIS — D649 Anemia, unspecified: Secondary | ICD-10-CM

## 2012-02-05 DIAGNOSIS — L0390 Cellulitis, unspecified: Secondary | ICD-10-CM

## 2012-02-05 DIAGNOSIS — E119 Type 2 diabetes mellitus without complications: Secondary | ICD-10-CM

## 2012-02-05 DIAGNOSIS — F329 Major depressive disorder, single episode, unspecified: Secondary | ICD-10-CM

## 2012-02-05 DIAGNOSIS — A480 Gas gangrene: Secondary | ICD-10-CM | POA: Diagnosis present

## 2012-02-05 LAB — CBC WITH DIFFERENTIAL/PLATELET
Basophils Relative: 0 % (ref 0–1)
Eosinophils Absolute: 0.6 10*3/uL (ref 0.0–0.7)
HCT: 22.7 % — ABNORMAL LOW (ref 36.0–46.0)
Hemoglobin: 7.2 g/dL — ABNORMAL LOW (ref 12.0–15.0)
Lymphs Abs: 1.2 10*3/uL (ref 0.7–4.0)
MCH: 26.4 pg (ref 26.0–34.0)
MCHC: 31.7 g/dL (ref 30.0–36.0)
Monocytes Absolute: 0.9 10*3/uL (ref 0.1–1.0)
Monocytes Relative: 8 % (ref 3–12)
RBC: 2.73 MIL/uL — ABNORMAL LOW (ref 3.87–5.11)

## 2012-02-05 LAB — COMPREHENSIVE METABOLIC PANEL
Albumin: 3.5 g/dL (ref 3.5–5.2)
Alkaline Phosphatase: 116 U/L (ref 39–117)
BUN: 18 mg/dL (ref 6–23)
Chloride: 104 mEq/L (ref 96–112)
Creatinine, Ser: 1.8 mg/dL — ABNORMAL HIGH (ref 0.50–1.10)
GFR calc Af Amer: 40 mL/min — ABNORMAL LOW (ref >90)
Glucose, Bld: 29 mg/dL — CL (ref 70–99)
Potassium: 3.5 mEq/L (ref 3.5–5.1)
Total Bilirubin: 0.2 mg/dL — ABNORMAL LOW (ref 0.3–1.2)

## 2012-02-05 LAB — PROTIME-INR
INR: 0.99 (ref 0.00–1.49)
Prothrombin Time: 13 seconds (ref 11.6–15.2)

## 2012-02-05 LAB — RETICULOCYTES
RBC.: 2.64 MIL/uL — ABNORMAL LOW (ref 3.87–5.11)
Retic Ct Pct: 3.3 % — ABNORMAL HIGH (ref 0.4–3.1)

## 2012-02-05 LAB — CBC
HCT: 21.8 % — ABNORMAL LOW (ref 36.0–46.0)
Hemoglobin: 7.1 g/dL — ABNORMAL LOW (ref 12.0–15.0)
MCH: 26.9 pg (ref 26.0–34.0)
MCHC: 32.6 g/dL (ref 30.0–36.0)
MCV: 82.6 fL (ref 78.0–100.0)

## 2012-02-05 LAB — GLUCOSE, CAPILLARY
Glucose-Capillary: 19 mg/dL — CL (ref 70–99)
Glucose-Capillary: 428 mg/dL — ABNORMAL HIGH (ref 70–99)
Glucose-Capillary: 507 mg/dL — ABNORMAL HIGH (ref 70–99)
Glucose-Capillary: >600 mg/dL (ref 70–99)

## 2012-02-05 LAB — MRSA PCR SCREENING: MRSA by PCR: NEGATIVE

## 2012-02-05 LAB — MAGNESIUM: Magnesium: 1.8 mg/dL (ref 1.5–2.5)

## 2012-02-05 LAB — VITAMIN B12: Vitamin B-12: 438 pg/mL (ref 211–911)

## 2012-02-05 LAB — FERRITIN: Ferritin: 35 ng/mL (ref 10–291)

## 2012-02-05 MED ORDER — CLINDAMYCIN PHOSPHATE 600 MG/50ML IV SOLN
600.0000 mg | Freq: Three times a day (TID) | INTRAVENOUS | Status: DC
  Administered 2012-02-05 – 2012-02-07 (×7): 600 mg via INTRAVENOUS
  Filled 2012-02-05 (×8): qty 50

## 2012-02-05 MED ORDER — PROMETHAZINE HCL 25 MG PO TABS
25.0000 mg | ORAL_TABLET | Freq: Four times a day (QID) | ORAL | Status: DC | PRN
  Administered 2012-02-06: 25 mg via ORAL
  Filled 2012-02-05: qty 1

## 2012-02-05 MED ORDER — KCL IN DEXTROSE-NACL 20-5-0.45 MEQ/L-%-% IV SOLN: Freq: Once | INTRAVENOUS | Status: DC

## 2012-02-05 MED ORDER — BIMATOPROST 0.03 % EX SOLN: 1.0000 "application " | Freq: Every day | CUTANEOUS | Status: DC

## 2012-02-05 MED ORDER — DEXTROSE-NACL 5-0.45 % IV SOLN: INTRAVENOUS | Status: DC

## 2012-02-05 MED ORDER — BUPROPION HCL ER (XL) 300 MG PO TB24
450.0000 mg | ORAL_TABLET | Freq: Every day | ORAL | Status: DC
  Administered 2012-02-06 – 2012-02-09 (×4): 450 mg via ORAL
  Filled 2012-02-05 (×4): qty 1

## 2012-02-05 MED ORDER — DEXTROSE-NACL 2.5-0.45 % IV SOLN
INTRAVENOUS | Status: DC
  Filled 2012-02-05: qty 1000

## 2012-02-05 MED ORDER — GLUCAGON HCL (RDNA) 1 MG IJ SOLR: 1.0000 mg | Freq: Once | INTRAMUSCULAR | Status: AC | PRN

## 2012-02-05 MED ORDER — VANCOMYCIN HCL IN DEXTROSE 1-5 GM/200ML-% IV SOLN
1000.0000 mg | Freq: Once | INTRAVENOUS | Status: AC
Start: 2012-02-05 — End: 2012-02-05
  Administered 2012-02-05: 1000 mg via INTRAVENOUS
  Filled 2012-02-05: qty 200

## 2012-02-05 MED ORDER — ALBUTEROL SULFATE HFA 108 (90 BASE) MCG/ACT IN AERS
1.0000 | INHALATION_SPRAY | Freq: Four times a day (QID) | RESPIRATORY_TRACT | Status: DC | PRN
  Filled 2012-02-05: qty 6.7

## 2012-02-05 MED ORDER — BIMATOPROST 0.01 % OP SOLN
1.0000 [drp] | Freq: Every day | OPHTHALMIC | Status: DC
  Filled 2012-02-05: qty 2.5

## 2012-02-05 MED ORDER — DEXTROSE 5 % IV SOLN: Freq: Once | INTRAVENOUS | Status: DC

## 2012-02-05 MED ORDER — INSULIN ASPART 100 UNIT/ML ~~LOC~~ SOLN: 0.0000 [IU] | SUBCUTANEOUS | Status: DC

## 2012-02-05 MED ORDER — ALPRAZOLAM 0.5 MG PO TABS
1.0000 mg | ORAL_TABLET | Freq: Three times a day (TID) | ORAL | Status: DC
  Administered 2012-02-05 – 2012-02-08 (×8): 1 mg via ORAL
  Filled 2012-02-05 (×4): qty 2
  Filled 2012-02-05 (×2): qty 1
  Filled 2012-02-05 (×2): qty 2
  Filled 2012-02-05: qty 1
  Filled 2012-02-05 (×2): qty 2

## 2012-02-05 MED ORDER — INSULIN ASPART 100 UNIT/ML ~~LOC~~ SOLN: 0.0000 [IU] | Freq: Three times a day (TID) | SUBCUTANEOUS | Status: DC

## 2012-02-05 MED ORDER — DEXTROSE-NACL 5-0.45 % IV SOLN
INTRAVENOUS | Status: DC
  Administered 2012-02-05: 01:00:00 via INTRAVENOUS

## 2012-02-05 MED ORDER — AZELASTINE HCL 0.1 % NA SOLN
1.0000 | Freq: Two times a day (BID) | NASAL | Status: DC
  Administered 2012-02-05 – 2012-02-08 (×6): 1 via NASAL
  Filled 2012-02-05: qty 30

## 2012-02-05 MED ORDER — PREGABALIN 50 MG PO CAPS: 50.0000 mg | ORAL_CAPSULE | Freq: Three times a day (TID) | ORAL | Status: DC

## 2012-02-05 MED ORDER — HYDROCODONE-ACETAMINOPHEN 5-325 MG PO TABS
1.0000 | ORAL_TABLET | Freq: Four times a day (QID) | ORAL | Status: DC | PRN
  Administered 2012-02-05 – 2012-02-07 (×5): 1 via ORAL
  Filled 2012-02-05 (×5): qty 1

## 2012-02-05 MED ORDER — LAMOTRIGINE 150 MG PO TABS
150.0000 mg | ORAL_TABLET | Freq: Every day | ORAL | Status: DC
  Administered 2012-02-06: 150 mg via ORAL
  Filled 2012-02-05: qty 1

## 2012-02-05 MED ORDER — BUPROPION HCL ER (XL) 300 MG PO TB24: 450.0000 mg | ORAL_TABLET | ORAL | Status: DC

## 2012-02-05 MED ORDER — INSULIN ASPART 100 UNIT/ML ~~LOC~~ SOLN
0.0000 [IU] | Freq: Three times a day (TID) | SUBCUTANEOUS | Status: DC
  Administered 2012-02-05: 15 [IU] via SUBCUTANEOUS

## 2012-02-05 MED ORDER — GLUCAGON (RDNA) 1 MG IJ KIT: 1.0000 mg | PACK | Freq: Once | INTRAMUSCULAR | Status: DC | PRN

## 2012-02-05 MED ORDER — ALPRAZOLAM 0.5 MG PO TABS
1.0000 mg | ORAL_TABLET | Freq: Three times a day (TID) | ORAL | Status: DC
  Administered 2012-02-05: 1 mg via ORAL
  Filled 2012-02-05 (×2): qty 1

## 2012-02-05 MED ORDER — LORATADINE 10 MG PO TABS
10.0000 mg | ORAL_TABLET | Freq: Every day | ORAL | Status: DC
  Administered 2012-02-05 – 2012-02-09 (×5): 10 mg via ORAL
  Filled 2012-02-05 (×5): qty 1

## 2012-02-05 MED ORDER — LAMOTRIGINE 100 MG PO TABS: 100.0000 mg | ORAL_TABLET | Freq: Two times a day (BID) | ORAL | Status: DC

## 2012-02-05 MED ORDER — CEFEPIME HCL 1 G IJ SOLR
INTRAMUSCULAR | Status: AC
  Filled 2012-02-05: qty 1

## 2012-02-05 MED ORDER — VANCOMYCIN HCL 10 G IV SOLR
1250.0000 mg | INTRAVENOUS | Status: DC
  Administered 2012-02-05 – 2012-02-07 (×3): 1250 mg via INTRAVENOUS
  Filled 2012-02-05 (×3): qty 1250

## 2012-02-05 MED ORDER — POTASSIUM CHLORIDE IN NACL 20-0.9 MEQ/L-% IV SOLN
INTRAVENOUS | Status: DC
  Administered 2012-02-05 – 2012-02-07 (×3): via INTRAVENOUS
  Filled 2012-02-05 (×10): qty 1000

## 2012-02-05 MED ORDER — FENTANYL CITRATE 0.05 MG/ML IJ SOLN
INTRAMUSCULAR | Status: AC
  Administered 2012-02-05: 100 ug via INTRAVENOUS
  Filled 2012-02-05: qty 2

## 2012-02-05 MED ORDER — SODIUM CHLORIDE 0.9 % IJ SOLN
3.0000 mL | Freq: Two times a day (BID) | INTRAMUSCULAR | Status: DC
  Administered 2012-02-06 – 2012-02-07 (×2): 3 mL via INTRAVENOUS

## 2012-02-05 MED ORDER — LEVOFLOXACIN IN D5W 750 MG/150ML IV SOLN
750.0000 mg | Freq: Once | INTRAVENOUS | Status: AC
  Administered 2012-02-05: 750 mg via INTRAVENOUS
  Filled 2012-02-05: qty 150

## 2012-02-05 MED ORDER — FENTANYL CITRATE 0.05 MG/ML IJ SOLN
INTRAMUSCULAR | Status: AC
  Filled 2012-02-05: qty 2

## 2012-02-05 MED ORDER — SODIUM CHLORIDE 0.9 % IV SOLN
500.0000 mg | Freq: Three times a day (TID) | INTRAVENOUS | Status: DC
  Administered 2012-02-05 – 2012-02-07 (×7): 500 mg via INTRAVENOUS
  Filled 2012-02-05 (×8): qty 500

## 2012-02-05 MED ORDER — INSULIN ASPART 100 UNIT/ML ~~LOC~~ SOLN
5.0000 [IU] | Freq: Once | SUBCUTANEOUS | Status: AC
  Administered 2012-02-05: 5 [IU] via SUBCUTANEOUS

## 2012-02-05 MED ORDER — DEXTROSE 50 % IV SOLN
INTRAVENOUS | Status: AC
  Administered 2012-02-05: 50 mL
  Filled 2012-02-05: qty 50

## 2012-02-05 MED ORDER — FENTANYL CITRATE 0.05 MG/ML IJ SOLN
100.0000 ug | Freq: Once | INTRAMUSCULAR | Status: AC
  Administered 2012-02-05: 100 ug via INTRAVENOUS

## 2012-02-05 MED ORDER — CYCLOBENZAPRINE HCL 10 MG PO TABS: 5.0000 mg | ORAL_TABLET | Freq: Every evening | ORAL | Status: DC | PRN

## 2012-02-05 NOTE — ED Notes (Signed)
Patient transported to X-ray 

## 2012-02-05 NOTE — ED Notes (Addendum)
Pt requesting pain medication, pt states the hydrocodone she took before she left is wearing off. MD made aware.
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
## 2012-02-05 NOTE — ED Notes (Signed)
Took patient's blood sugar, result was: 19. Nurse and MD notified.
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
## 2012-02-05 NOTE — Progress Notes (Signed)
Pt refused CBG coverage of 9 units SQ. Pt states that she does not want coverage because the increased stress will cause her blood sugar to "bottom out" and she would rather wait for lunch time recheck before considering sugar check. Pt alert and oriented x3. Dr Cena Benton aware of pt's statement and choice not to cover CBG.

## 2012-02-05 NOTE — ED Notes (Signed)
MD at bedside. Family at bedside

## 2012-02-05 NOTE — ED Notes (Signed)
MD at bedside. 
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
## 2012-02-05 NOTE — Progress Notes (Signed)
Pt CBG 507. Pt states she will take 15 units insulin SQ and will wait for ortho to see her. Ortho called to verify consult and will be up shortly. Dr Cena Benton informed of pt's CBG and insulin coverage. Dr Cena Benton ordered 15 units.

## 2012-02-05 NOTE — ED Notes (Signed)
Care link here for transport now 

## 2012-02-05 NOTE — Progress Notes (Signed)
Pt seen Melissa [name] to consult later Significant LLE venous stasis ulcer No tissue crepitus Ok to eat On iv abx

## 2012-02-05 NOTE — Progress Notes (Signed)
Pt continues to eat candy and other sugary foods from her pocketbook. Pt continues to have increased blood sugar and refuses coverage. Dr Cena Benton aware

## 2012-02-05 NOTE — Progress Notes (Signed)
CBG 517. Pt requests food but doesn't want insulin coverage at this time. Pt refuses meal tray from nutrition. Husband has gone downstairs for food from cafeteria. Both informed of pt diet.

## 2012-02-05 NOTE — ED Notes (Signed)
Took patient's blood sugar and result was: 104. Nurse notified.

## 2012-02-05 NOTE — ED Notes (Signed)
Pt concerned IV is infiltrated. IV flushed, good blood return. IV is intact and infusing appropriately.

## 2012-02-05 NOTE — ED Notes (Signed)
Took patient's blood sugar, result was: 149. Nurse notified.
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
## 2012-02-05 NOTE — ED Notes (Signed)
Took patient's blood sugar, result was: 282. Nurse notified.

## 2012-02-06 ENCOUNTER — Encounter (HOSPITAL_COMMUNITY)
Admission: EM | Disposition: A | Discharge status: Home Health | Payer: Self-pay | Source: Home / Self Care | Attending: Internal Medicine

## 2012-02-06 ENCOUNTER — Encounter (HOSPITAL_COMMUNITY): Payer: Self-pay | Admitting: Anesthesiology

## 2012-02-06 ENCOUNTER — Inpatient Hospital Stay (HOSPITAL_COMMUNITY): Payer: BC Managed Care – PPO | Admitting: Anesthesiology

## 2012-02-06 DIAGNOSIS — D649 Anemia, unspecified: Secondary | ICD-10-CM

## 2012-02-06 HISTORY — PX: I&D EXTREMITY: SHX5045

## 2012-02-06 LAB — GLUCOSE, CAPILLARY
Glucose-Capillary: 84 mg/dL (ref 70–99)
Glucose-Capillary: 79 mg/dL (ref 70–99)
Glucose-Capillary: 112 mg/dL — ABNORMAL HIGH (ref 70–99)

## 2012-02-06 LAB — BASIC METABOLIC PANEL
BUN: 19 mg/dL (ref 6–23)
CO2: 21 mEq/L (ref 19–32)
Calcium: 8.9 mg/dL (ref 8.4–10.5)
Creatinine, Ser: 1.58 mg/dL — ABNORMAL HIGH (ref 0.50–1.10)
GFR calc non Af Amer: 40 mL/min — ABNORMAL LOW (ref >90)
Glucose, Bld: 250 mg/dL — ABNORMAL HIGH (ref 70–99)
Sodium: 132 mEq/L — ABNORMAL LOW (ref 135–145)

## 2012-02-06 LAB — CBC
MCH: 26.3 pg (ref 26.0–34.0)
MCHC: 31.9 g/dL (ref 30.0–36.0)
MCV: 82.5 fL (ref 78.0–100.0)
Platelets: 294 10*3/uL (ref 150–400)
RDW: 13.6 % (ref 11.5–15.5)

## 2012-02-06 LAB — PREPARE RBC (CROSSMATCH)

## 2012-02-06 LAB — ABO/RH: ABO/RH(D): A POS

## 2012-02-06 SURGERY — IRRIGATION AND DEBRIDEMENT EXTREMITY
Anesthesia: General | Site: Leg Lower | Laterality: Left | Wound class: Dirty or Infected

## 2012-02-06 MED ORDER — LACTATED RINGERS IV SOLN
INTRAVENOUS | Status: DC
  Administered 2012-02-06: 05:00:00 via INTRAVENOUS

## 2012-02-06 MED ORDER — INSULIN ASPART 100 UNIT/ML ~~LOC~~ SOLN: 0.0000 [IU] | Freq: Three times a day (TID) | SUBCUTANEOUS | Status: DC

## 2012-02-06 MED ORDER — INSULIN ASPART 100 UNIT/ML ~~LOC~~ SOLN: 0.0000 [IU] | Freq: Every day | SUBCUTANEOUS | Status: DC

## 2012-02-06 MED ORDER — GENTAMICIN SULFATE 40 MG/ML IJ SOLN
INTRAMUSCULAR | Status: AC
  Filled 2012-02-06: qty 6

## 2012-02-06 MED ORDER — INSULIN GLARGINE 100 UNIT/ML ~~LOC~~ SOLN
10.0000 [IU] | Freq: Every day | SUBCUTANEOUS | Status: DC
  Administered 2012-02-06: 10 [IU] via SUBCUTANEOUS

## 2012-02-06 MED ORDER — MEPERIDINE HCL 25 MG/ML IJ SOLN: 6.2500 mg | INTRAMUSCULAR | Status: DC | PRN

## 2012-02-06 MED ORDER — VANCOMYCIN HCL 1000 MG IV SOLR
INTRAVENOUS | Status: AC
  Filled 2012-02-06: qty 1000

## 2012-02-06 MED ORDER — WARFARIN VIDEO: Freq: Once | Status: DC

## 2012-02-06 MED ORDER — PATIENT'S GUIDE TO USING COUMADIN BOOK
Freq: Once | Status: AC
  Administered 2012-02-06: 17:00:00
  Filled 2012-02-06: qty 1

## 2012-02-06 MED ORDER — ADULT MULTIVITAMIN W/MINERALS CH
1.0000 | ORAL_TABLET | Freq: Every day | ORAL | Status: DC
  Administered 2012-02-06 – 2012-02-09 (×3): 1 via ORAL
  Filled 2012-02-06 (×4): qty 1

## 2012-02-06 MED ORDER — EPHEDRINE SULFATE 50 MG/ML IJ SOLN
INTRAMUSCULAR | Status: DC | PRN
  Administered 2012-02-06: 5 mg via INTRAVENOUS
  Administered 2012-02-06 (×2): 10 mg via INTRAVENOUS

## 2012-02-06 MED ORDER — SODIUM CHLORIDE 0.9 % IR SOLN
Status: DC | PRN
  Administered 2012-02-06: 3000 mL

## 2012-02-06 MED ORDER — DIPHENHYDRAMINE HCL 25 MG PO CAPS: 25.0000 mg | ORAL_CAPSULE | Freq: Once | ORAL | Status: DC

## 2012-02-06 MED ORDER — HYDROMORPHONE HCL PF 1 MG/ML IJ SOLN: 0.2500 mg | INTRAMUSCULAR | Status: DC | PRN

## 2012-02-06 MED ORDER — SODIUM CHLORIDE 0.9 % IV SOLN
INTRAVENOUS | Status: DC | PRN
  Administered 2012-02-06: 15:00:00 via INTRAVENOUS

## 2012-02-06 MED ORDER — FENTANYL CITRATE 0.05 MG/ML IJ SOLN: 50.0000 ug | INTRAMUSCULAR | Status: DC | PRN

## 2012-02-06 MED ORDER — LIDOCAINE HCL (CARDIAC) 20 MG/ML IV SOLN
INTRAVENOUS | Status: DC | PRN
  Administered 2012-02-06: 40 mg via INTRAVENOUS

## 2012-02-06 MED ORDER — OXYCODONE HCL 5 MG/5ML PO SOLN: 5.0000 mg | Freq: Once | ORAL | Status: AC | PRN

## 2012-02-06 MED ORDER — METOCLOPRAMIDE HCL 5 MG PO TABS
5.0000 mg | ORAL_TABLET | Freq: Three times a day (TID) | ORAL | Status: DC | PRN
  Filled 2012-02-06: qty 2

## 2012-02-06 MED ORDER — ACETAMINOPHEN 325 MG PO TABS: 650.0000 mg | ORAL_TABLET | Freq: Once | ORAL | Status: DC

## 2012-02-06 MED ORDER — WARFARIN SODIUM 5 MG PO TABS
5.0000 mg | ORAL_TABLET | Freq: Once | ORAL | Status: AC
  Filled 2012-02-06: qty 1

## 2012-02-06 MED ORDER — LAMOTRIGINE 100 MG PO TABS
100.0000 mg | ORAL_TABLET | Freq: Two times a day (BID) | ORAL | Status: DC
  Administered 2012-02-06 – 2012-02-09 (×6): 100 mg via ORAL
  Filled 2012-02-06 (×7): qty 1

## 2012-02-06 MED ORDER — INSULIN DETEMIR 100 UNIT/ML ~~LOC~~ SOLN
10.0000 [IU] | Freq: Every day | SUBCUTANEOUS | Status: DC
  Administered 2012-02-06: 10 [IU] via SUBCUTANEOUS
  Filled 2012-02-06: qty 10

## 2012-02-06 MED ORDER — INSULIN ASPART 100 UNIT/ML ~~LOC~~ SOLN
7.0000 [IU] | Freq: Once | SUBCUTANEOUS | Status: AC
  Administered 2012-02-06: 7 [IU] via SUBCUTANEOUS

## 2012-02-06 MED ORDER — LACTATED RINGERS IV SOLN
INTRAVENOUS | Status: DC | PRN
  Administered 2012-02-06: 15:00:00 via INTRAVENOUS

## 2012-02-06 MED ORDER — ONDANSETRON HCL 4 MG PO TABS: 4.0000 mg | ORAL_TABLET | Freq: Four times a day (QID) | ORAL | Status: DC | PRN

## 2012-02-06 MED ORDER — CHLORHEXIDINE GLUCONATE 4 % EX LIQD
60.0000 mL | Freq: Once | CUTANEOUS | Status: AC
  Administered 2012-02-06: 4 via TOPICAL
  Filled 2012-02-06: qty 60

## 2012-02-06 MED ORDER — METOCLOPRAMIDE HCL 5 MG/ML IJ SOLN
5.0000 mg | Freq: Three times a day (TID) | INTRAMUSCULAR | Status: DC | PRN
Start: 2012-02-06 — End: 2012-02-07
  Filled 2012-02-06: qty 2

## 2012-02-06 MED ORDER — SODIUM CHLORIDE 0.9 % IV SOLN: INTRAVENOUS | Status: DC

## 2012-02-06 MED ORDER — SODIUM CHLORIDE 0.9 % IR SOLN
Status: DC | PRN
  Administered 2012-02-06: 1000 mL

## 2012-02-06 MED ORDER — OXYCODONE HCL 5 MG PO TABS
5.0000 mg | ORAL_TABLET | Freq: Once | ORAL | Status: AC | PRN
  Administered 2012-02-06: 5 mg via ORAL
  Filled 2012-02-06: qty 1

## 2012-02-06 MED ORDER — ONDANSETRON HCL 4 MG/2ML IJ SOLN: 4.0000 mg | Freq: Four times a day (QID) | INTRAMUSCULAR | Status: DC | PRN

## 2012-02-06 MED ORDER — PROMETHAZINE HCL 25 MG/ML IJ SOLN: 6.2500 mg | INTRAMUSCULAR | Status: DC | PRN

## 2012-02-06 MED ORDER — PROPOFOL 10 MG/ML IV BOLUS
INTRAVENOUS | Status: DC | PRN
  Administered 2012-02-06: 140 mg via INTRAVENOUS

## 2012-02-06 MED ORDER — WARFARIN - PHARMACIST DOSING INPATIENT: Freq: Every day | Status: DC

## 2012-02-06 MED ORDER — FENTANYL CITRATE 0.05 MG/ML IJ SOLN
INTRAMUSCULAR | Status: DC | PRN
  Administered 2012-02-06: 100 ug via INTRAVENOUS

## 2012-02-06 MED ORDER — MIDAZOLAM HCL 2 MG/2ML IJ SOLN: 1.0000 mg | INTRAMUSCULAR | Status: DC | PRN

## 2012-02-06 MED ORDER — INSULIN ASPART 100 UNIT/ML ~~LOC~~ SOLN
0.0000 [IU] | SUBCUTANEOUS | Status: DC
  Administered 2012-02-06: 8 [IU] via SUBCUTANEOUS

## 2012-02-06 MED ORDER — LISDEXAMFETAMINE DIMESYLATE 70 MG PO CAPS
70.0000 mg | ORAL_CAPSULE | Freq: Every day | ORAL | Status: DC
  Administered 2012-02-08: 70 mg via ORAL

## 2012-02-06 SURGICAL SUPPLY — 55 items
BLADE SURG 10 STRL SS (BLADE) IMPLANT
BLADE SURG 21 STRL SS (BLADE) ×2 IMPLANT
BNDG COHESIVE 4X5 TAN STRL (GAUZE/BANDAGES/DRESSINGS) IMPLANT
BNDG COHESIVE 6X5 TAN STRL LF (GAUZE/BANDAGES/DRESSINGS) IMPLANT
BNDG GAUZE STRTCH 6 (GAUZE/BANDAGES/DRESSINGS) IMPLANT
CANISTER WOUND CARE 500ML ATS (WOUND CARE) ×2 IMPLANT
CLOTH BEACON ORANGE TIMEOUT ST (SAFETY) ×2 IMPLANT
CONT SPEC 4OZ CLIKSEAL STRL BL (MISCELLANEOUS) ×2 IMPLANT
COTTON STERILE ROLL (GAUZE/BANDAGES/DRESSINGS) IMPLANT
COVER SURGICAL LIGHT HANDLE (MISCELLANEOUS) ×2 IMPLANT
CUFF TOURNIQUET SINGLE 18IN (TOURNIQUET CUFF) IMPLANT
CUFF TOURNIQUET SINGLE 24IN (TOURNIQUET CUFF) IMPLANT
CUFF TOURNIQUET SINGLE 34IN LL (TOURNIQUET CUFF) IMPLANT
CUFF TOURNIQUET SINGLE 44IN (TOURNIQUET CUFF) IMPLANT
DERMACARRIERS GRAFT 1 TO 1.5 (DISPOSABLE) ×2
DRAPE INCISE IOBAN 66X45 STRL (DRAPES) ×2 IMPLANT
DRAPE U-SHAPE 47X51 STRL (DRAPES) ×2 IMPLANT
DRSG ADAPTIC 3X8 NADH LF (GAUZE/BANDAGES/DRESSINGS) IMPLANT
DRSG MEPITEL 4X7.2 (GAUZE/BANDAGES/DRESSINGS) ×4 IMPLANT
DRSG VAC ATS MED SENSATRAC (GAUZE/BANDAGES/DRESSINGS) ×2 IMPLANT
DURAPREP 26ML APPLICATOR (WOUND CARE) ×2 IMPLANT
ELECT CAUTERY BLADE 6.4 (BLADE) ×2 IMPLANT
ELECT REM PT RETURN 9FT ADLT (ELECTROSURGICAL) ×2
ELECTRODE REM PT RTRN 9FT ADLT (ELECTROSURGICAL) ×1 IMPLANT
GLOVE BIOGEL PI IND STRL 6.5 (GLOVE) ×1 IMPLANT
GLOVE BIOGEL PI IND STRL 9 (GLOVE) ×1 IMPLANT
GLOVE BIOGEL PI INDICATOR 6.5 (GLOVE) ×1
GLOVE BIOGEL PI INDICATOR 9 (GLOVE) ×1
GLOVE SURG ORTHO 9.0 STRL STRW (GLOVE) ×2 IMPLANT
GLOVE SURG SS PI 6.5 STRL IVOR (GLOVE) ×4 IMPLANT
GOWN PREVENTION PLUS XLARGE (GOWN DISPOSABLE) IMPLANT
GOWN SRG XL XLNG 56XLVL 4 (GOWN DISPOSABLE) ×1 IMPLANT
GOWN STRL NON-REIN XL XLG LVL4 (GOWN DISPOSABLE) ×1
GRAFT DERMACARRIERS 1 TO 1.5 (DISPOSABLE) ×1 IMPLANT
HANDPIECE INTERPULSE COAX TIP (DISPOSABLE) ×1
KIT BASIN OR (CUSTOM PROCEDURE TRAY) ×2 IMPLANT
KIT ROOM TURNOVER OR (KITS) ×2 IMPLANT
MANIFOLD NEPTUNE II (INSTRUMENTS) ×2 IMPLANT
MATRIX SURGICAL PSMX 10X15CM (Tissue) ×2 IMPLANT
NS IRRIG 1000ML POUR BTL (IV SOLUTION) ×2 IMPLANT
PACK ORTHO EXTREMITY (CUSTOM PROCEDURE TRAY) ×2 IMPLANT
PAD ARMBOARD 7.5X6 YLW CONV (MISCELLANEOUS) ×2 IMPLANT
PADDING CAST COTTON 6X4 STRL (CAST SUPPLIES) IMPLANT
SET HNDPC FAN SPRY TIP SCT (DISPOSABLE) ×1 IMPLANT
SPONGE GAUZE 4X4 12PLY (GAUZE/BANDAGES/DRESSINGS) IMPLANT
SPONGE LAP 18X18 X RAY DECT (DISPOSABLE) ×2 IMPLANT
STAPLER VISISTAT (STAPLE) ×2 IMPLANT
STOCKINETTE IMPERVIOUS 9X36 MD (GAUZE/BANDAGES/DRESSINGS) IMPLANT
TOWEL OR 17X24 6PK STRL BLUE (TOWEL DISPOSABLE) ×2 IMPLANT
TOWEL OR 17X26 10 PK STRL BLUE (TOWEL DISPOSABLE) ×2 IMPLANT
TUBE ANAEROBIC SPECIMEN COL (MISCELLANEOUS) IMPLANT
TUBE CONNECTING 12X1/4 (SUCTIONS) ×2 IMPLANT
UNDERPAD 30X30 INCONTINENT (UNDERPADS AND DIAPERS) IMPLANT
WATER STERILE IRR 1000ML POUR (IV SOLUTION) IMPLANT
YANKAUER SUCT BULB TIP NO VENT (SUCTIONS) ×2 IMPLANT
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
## 2012-02-06 NOTE — Progress Notes (Signed)
repor tgiven to mark rn as caregiver

## 2012-02-06 NOTE — Anesthesia Procedure Notes (Signed)
Procedure Name: LMA Insertion Date/Time: 02/06/2012 2:47 PM Performed by: Alanda Amass A Pre-anesthesia Checklist: Patient identified, Timeout performed, Emergency Drugs available, Suction available and Patient being monitored Patient Re-evaluated:Patient Re-evaluated prior to inductionOxygen Delivery Method: Circle system utilized Preoxygenation: Pre-oxygenation with 100% oxygen Intubation Type: IV induction Ventilation: Mask ventilation without difficulty LMA: LMA inserted LMA Size: 4.0 Tube type: Oral Number of attempts: 1 Tube secured with: Tape Dental Injury: Teeth and Oropharynx as per pre-operative assessment
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
## 2012-02-06 NOTE — Progress Notes (Addendum)
CRITICAL VALUE ALERT  Critical value received:  Hgb 6.6  Date of notification:  02/06/12  Time of notification:  0550  Critical value read back:yes  Nurse who received alert:  A Horton RN  MD notified (1st page): L. Harduk PA  Time of first page:  0555  MD notified (2nd page):  Time of second page:  Responding MD:  Wyn Forster  Time MD responded:  [phone]

## 2012-02-06 NOTE — Preoperative (Signed)
Beta Blockers   Reason not to administer Beta Blockers:Not Applicable 

## 2012-02-06 NOTE — Anesthesia Preprocedure Evaluation (Signed)
Anesthesia Evaluation  Patient identified by MRN, date of birth, ID band Patient awake    History of Anesthesia Complications Negative for: history of anesthetic complications  Airway Mallampati: II  Neck ROM: Full    Dental   Pulmonary neg pulmonary ROS,  breath sounds clear to auscultation        Cardiovascular hypertension, Rhythm:Regular Rate:Normal     Neuro/Psych  Neuromuscular disease    GI/Hepatic negative GI ROS, Neg liver ROS,   Endo/Other  diabetes, Poorly Controlled  Renal/GU Renal disease     Musculoskeletal   Abdominal   Peds  Hematology   Anesthesia Other Findings   Reproductive/Obstetrics                           Anesthesia Physical Anesthesia Plan  ASA: III  Anesthesia Plan: General   Post-op Pain Management:    Induction: Intravenous  Airway Management Planned: LMA  Additional Equipment:   Intra-op Plan:   Post-operative Plan: Extubation in OR  Informed Consent: I have reviewed the patients History and Physical, chart, labs and discussed the procedure including the risks, benefits and alternatives for the proposed anesthesia with the patient or authorized representative who has indicated his/her understanding and acceptance.   Dental advisory given  Plan Discussed with: CRNA and Surgeon  Anesthesia Plan Comments:         Anesthesia Quick Evaluation

## 2012-02-06 NOTE — Progress Notes (Signed)
Spoke with patient about the importance of getting the blood and going to the OR. Patient is still refusing both until her husband arrives and she can talk to him. Pt resting in bed and vitals stable at this time. Md is also aware of the situation.  Jamir Rone, Charlaine Dalton RN
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
## 2012-02-06 NOTE — Progress Notes (Signed)
o2 sats 88 %.  Pt refused oxygen
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
## 2012-02-06 NOTE — Transfer of Care (Signed)
Immediate Anesthesia Transfer of Care Note  Patient: Melissa [name]  Procedure(s) Performed: Procedure(s) (LRB) with comments: IRRIGATION AND DEBRIDEMENT EXTREMITY (Left)  Patient Location: PACU  Anesthesia Type:General  Level of Consciousness: awake  Airway & Oxygen Therapy: Patient Spontanous Breathing and Patient connected to nasal cannula oxygen  Post-op Assessment: Report given to PACU RN and Post -op Vital signs reviewed and stable  Post vital signs: Reviewed and stable  Complications: No apparent anesthesia complications

## 2012-02-07 LAB — COMPREHENSIVE METABOLIC PANEL
ALT: 29 U/L (ref 0–35)
AST: 63 U/L — ABNORMAL HIGH (ref 0–37)
Alkaline Phosphatase: 131 U/L — ABNORMAL HIGH (ref 39–117)
CO2: 23 mEq/L (ref 19–32)
GFR calc Af Amer: 55 mL/min — ABNORMAL LOW (ref >90)
Glucose, Bld: 86 mg/dL (ref 70–99)
Potassium: 4.2 mEq/L (ref 3.5–5.1)
Sodium: 137 mEq/L (ref 135–145)
Total Protein: 6.1 g/dL (ref 6.0–8.3)

## 2012-02-07 LAB — TYPE AND SCREEN
Antibody Screen: NEGATIVE
Unit division: 0

## 2012-02-07 LAB — CBC
Hemoglobin: 9.4 g/dL — ABNORMAL LOW (ref 12.0–15.0)
Platelets: 331 10*3/uL (ref 150–400)
RBC: 3.49 MIL/uL — ABNORMAL LOW (ref 3.87–5.11)

## 2012-02-07 LAB — WOUND CULTURE

## 2012-02-07 LAB — GLUCOSE, CAPILLARY
Glucose-Capillary: 45 mg/dL — ABNORMAL LOW (ref 70–99)
Glucose-Capillary: 144 mg/dL — ABNORMAL HIGH (ref 70–99)

## 2012-02-07 MED ORDER — ACETAMINOPHEN 325 MG PO TABS: 650.0000 mg | ORAL_TABLET | Freq: Four times a day (QID) | ORAL | Status: DC | PRN

## 2012-02-07 MED ORDER — LEVOFLOXACIN IN D5W 750 MG/150ML IV SOLN
750.0000 mg | Freq: Every day | INTRAVENOUS | Status: DC
  Administered 2012-02-07: 750 mg via INTRAVENOUS
  Filled 2012-02-07 (×3): qty 150

## 2012-02-07 MED ORDER — HYDROMORPHONE HCL PF 1 MG/ML IJ SOLN: 0.5000 mg | INTRAMUSCULAR | Status: DC | PRN

## 2012-02-07 MED ORDER — INSULIN ASPART 100 UNIT/ML ~~LOC~~ SOLN: 0.0000 [IU] | Freq: Three times a day (TID) | SUBCUTANEOUS | Status: DC

## 2012-02-07 MED ORDER — DEXTROSE 50 % IV SOLN
INTRAVENOUS | Status: AC
  Administered 2012-02-07: 50 mL via INTRAVENOUS
  Filled 2012-02-07: qty 50

## 2012-02-07 MED ORDER — WARFARIN SODIUM 2.5 MG PO TABS
2.5000 mg | ORAL_TABLET | Freq: Once | ORAL | Status: DC
  Filled 2012-02-07: qty 1

## 2012-02-07 MED ORDER — OXYCODONE HCL 5 MG PO TABS
5.0000 mg | ORAL_TABLET | ORAL | Status: DC | PRN
  Administered 2012-02-07: 10 mg via ORAL
  Filled 2012-02-07: qty 2

## 2012-02-07 MED ORDER — DEXTROSE 50 % IV SOLN
25.0000 mL | Freq: Once | INTRAVENOUS | Status: AC | PRN
  Administered 2012-02-07: 50 mL via INTRAVENOUS

## 2012-02-07 NOTE — Progress Notes (Signed)
PT Cancellation Note  Patient Details Name: Kaiya [place] MRN: [id number] DOB: 07-13-1970   Cancelled Treatment:    Reason Eval/Treat Not Completed: Other (comment) (patient reports she is independent with mobility ) an does not need physical therapy.  RN confirms that patient has been ambulating in room without incident.  Will sign off.   Olivia Canter 02/07/2012, 3:42 PM
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
## 2012-02-08 DIAGNOSIS — I10 Essential (primary) hypertension: Secondary | ICD-10-CM

## 2012-02-08 LAB — BASIC METABOLIC PANEL
CO2: 21 mEq/L (ref 19–32)
Chloride: 103 mEq/L (ref 96–112)
GFR calc Af Amer: 55 mL/min — ABNORMAL LOW (ref >90)
Potassium: 4.2 mEq/L (ref 3.5–5.1)
Sodium: 137 mEq/L (ref 135–145)

## 2012-02-08 LAB — GLUCOSE, CAPILLARY
Glucose-Capillary: 196 mg/dL — ABNORMAL HIGH (ref 70–99)
Glucose-Capillary: 181 mg/dL — ABNORMAL HIGH (ref 70–99)
Glucose-Capillary: 127 mg/dL — ABNORMAL HIGH (ref 70–99)
Glucose-Capillary: 36 mg/dL — CL (ref 70–99)
Glucose-Capillary: 60 mg/dL — ABNORMAL LOW (ref 70–99)
Glucose-Capillary: 170 mg/dL — ABNORMAL HIGH (ref 70–99)
Glucose-Capillary: 61 mg/dL — ABNORMAL LOW (ref 70–99)

## 2012-02-08 LAB — CBC
Platelets: 365 10*3/uL (ref 150–400)
RBC: 3.78 MIL/uL — ABNORMAL LOW (ref 3.87–5.11)
RDW: 14.2 % (ref 11.5–15.5)
WBC: 10.1 10*3/uL (ref 4.0–10.5)

## 2012-02-08 MED ORDER — ZOLPIDEM TARTRATE 5 MG PO TABS
5.0000 mg | ORAL_TABLET | Freq: Every evening | ORAL | Status: DC | PRN
  Administered 2012-02-08: 5 mg via ORAL
  Filled 2012-02-08: qty 1
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
## 2012-02-08 NOTE — Progress Notes (Signed)
Received a call from pt husband saying pt needed to see a nurse her foot was turning blue and her wound vac was not working. assesses pt foot. Pt lt foot was slightly red and swollen. Foot Warm to touch, without any changes from baseline. Assessed wound vac. Wound vac again was showing a leak. Again pt had a broken seal around ankle area. Tegaderm placed to ankle area. Wound vac was resting in bed with pt. Reset wound vac. Vac working properly. Education regarding need for continuous suction to leg was completed with pt. Vac again placed on floor. Will continue to monitor wound vac for additional needs.

## 2012-02-08 NOTE — Progress Notes (Signed)
Received call from secretary that pt needed assistance. Pt reports wound vac is not on it just quit working. Pt is holding wound vac machine in hand and says 'I do not know how to work these things." wound vac has been turned off. Turned wound vac back on. Leak noted by machine in pt wound vac seal. Leak found by manipulating different pressure points. Pt tolerated good. Seal broken at anterior ankle area. tegaderm placed until full suction was returned. Leak undetected by machine with new tegaderm in place. Wound vac working properly continuous suction @ 75.

## 2012-02-08 NOTE — Progress Notes (Signed)
Pt has refused her IV Fluids and PT/INR.We will inform MD when he rounds this AM.

## 2012-02-09 ENCOUNTER — Telehealth: Payer: Self-pay | Admitting: *Deleted

## 2012-02-09 ENCOUNTER — Encounter (HOSPITAL_COMMUNITY): Payer: Self-pay | Admitting: Orthopedic Surgery

## 2012-02-09 LAB — GLUCOSE, CAPILLARY: Glucose-Capillary: 93 mg/dL (ref 70–99)

## 2012-02-09 MED ORDER — LEVOFLOXACIN 750 MG PO TABS: 750.0000 mg | ORAL_TABLET | Freq: Every day | ORAL | Status: DC

## 2012-02-09 MED ORDER — HYDROCODONE-ACETAMINOPHEN 5-325 MG PO TABS: 1.0000 | ORAL_TABLET | Freq: Four times a day (QID) | ORAL | Status: DC | PRN

## 2012-02-09 MED ORDER — LISDEXAMFETAMINE DIMESYLATE 70 MG PO CAPS
70.0000 mg | ORAL_CAPSULE | Freq: Every day | ORAL | Status: DC
  Administered 2012-02-09: 70 mg via ORAL

## 2012-02-09 NOTE — Progress Notes (Signed)
Patient ID: Charl [place], female   DOB: 29-Nov-1970, 41 y.o.   MRN: [id number] [facility] removed this morning and dry dressing applied. Patient's wound is approximately 10 x 20 cm and approximately 5 mm deep. Patient will require continued wound VAC treatment for approximately 2 weeks. We will request authorization for a home wound VAC. Patient may be discharged to home once the home wound VAC has been arranged. Approximately 50% the wound shows improved granulation tissue.

## 2012-02-09 NOTE — Significant Event (Signed)
Hypoglycemic Event  CBG: 29  Treatment: Carbs  Symptoms: Hot, slurred speech, hungry  Follow-up CBG: Time:0625 CBG Result: 93  Possible Reasons for Event: :Poor PO intake  Comments/MD notified:     Areatha Keas  Remember to initiate Hypoglycemia Order Set & complete

## 2012-02-09 NOTE — Telephone Encounter (Signed)
Patient had wound debridement last Friday and had to Cancel her appointment for tomorrow and wants you to refill her Vyvanse but it looks like she sees Katharina Caper for [facility].... Do you fill for her?

## 2012-02-09 NOTE — Telephone Encounter (Signed)
It looks like to me that Dr. Christell Constant actually fill this prescription on December 4. She may want to call down there to see if they have the prescription waiting for her.
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
## 2012-02-09 NOTE — Progress Notes (Signed)
Pt provided with dc instructions and education. Pt verbalized understanding. Pt provided with new prescriptions and edcuated on medications. Pt teachback how to take them. Pt aware that [facility] will come to house to set up wound vac. Pt knows to schedule follow up appointments as discussed on AVS. No needs at this time. VSS. Pt leaving for home with family. Levonne Spiller, RN

## 2012-02-09 NOTE — Significant Event (Signed)
Hypoglycemic Event  CBG: 60  Treatment: Peanut butter and graham crackers  Symptoms: none  Follow-up CBG: Time 2230 CBG Result 61  Treatment: Juice  Follow up CBG: Time 2310 CBG Result 170  Possible Reasons for Event: inadequate meal intake  Comments/MD notified: Patient stated she doesn't like the food here so hasn't been eating.    Kentavious Michele, Wyman Songster  Remember to initiate Hypoglycemia Order Set & complete

## 2012-02-10 ENCOUNTER — Telehealth: Payer: Self-pay | Admitting: *Deleted

## 2012-02-10 NOTE — Telephone Encounter (Signed)
See notes

## 2012-02-11 LAB — CULTURE, BLOOD (ROUTINE X 2): Culture: NO GROWTH

## 2012-02-12 ENCOUNTER — Ambulatory Visit: Payer: Self-pay | Admitting: Family Medicine

## 2012-02-13 ENCOUNTER — Ambulatory Visit: Payer: Self-pay | Admitting: Sports Medicine

## 2012-02-23 ENCOUNTER — Telehealth: Payer: Self-pay | Admitting: *Deleted

## 2012-02-23 ENCOUNTER — Telehealth: Payer: Self-pay | Admitting: Family Medicine

## 2012-02-23 NOTE — Telephone Encounter (Signed)
I am happy to discuss with her when she comes in for diabetic f/u. She hasn't been in for diabetic f/u in a long time.  Also if she calls Dr. Kathi Der office and tell them she was in the hopsital they will excuse her absence.

## 2012-02-23 NOTE — Telephone Encounter (Signed)
Pt called and states she needed a refill on vyvanse. I advised her per last note that she needed to get rx from Dr. Kathi Der office.Pt called back and left a message stating she was told that she could get one more rx written until she could get back in to see Dr. Christell Constant. Pt states she was d/c from their office because she missed appt due to that fact that she was in the hospital. Please advise if it ok to write a rx for vyvanse. I didn't see this in the notes anywhere.

## 2012-02-23 NOTE — Telephone Encounter (Signed)
Patient called and left a message and asked to have a nurse to call her back [phone]

## 2012-02-23 NOTE — Telephone Encounter (Signed)
Patient called and left a message on nurse line asking for a return call.   Returned Call: Left message asking patient to call back.  

## 2012-02-24 NOTE — Telephone Encounter (Signed)
Left message on pt.'s vm.

## 2012-02-27 ENCOUNTER — Ambulatory Visit: Payer: BC Managed Care – PPO | Admitting: Family Medicine

## 2012-03-01 ENCOUNTER — Ambulatory Visit (INDEPENDENT_AMBULATORY_CARE_PROVIDER_SITE_OTHER): Payer: BC Managed Care – PPO | Admitting: Physician Assistant

## 2012-03-01 ENCOUNTER — Encounter: Payer: Self-pay | Admitting: Physician Assistant

## 2012-03-01 VITALS — BP 154/82 | HR 101 | Temp 99.1°F | Resp 18 | Wt 158.0 lb

## 2012-03-01 DIAGNOSIS — F411 Generalized anxiety disorder: Secondary | ICD-10-CM

## 2012-03-01 DIAGNOSIS — E119 Type 2 diabetes mellitus without complications: Secondary | ICD-10-CM

## 2012-03-01 DIAGNOSIS — F4310 Post-traumatic stress disorder, unspecified: Secondary | ICD-10-CM

## 2012-03-01 DIAGNOSIS — I10 Essential (primary) hypertension: Secondary | ICD-10-CM

## 2012-03-01 DIAGNOSIS — G4700 Insomnia, unspecified: Secondary | ICD-10-CM

## 2012-03-01 MED ORDER — ZOLPIDEM TARTRATE ER 12.5 MG PO TBCR: 12.5000 mg | EXTENDED_RELEASE_TABLET | Freq: Every evening | ORAL | Status: DC | PRN

## 2012-03-01 MED ORDER — ALPRAZOLAM 1 MG PO TABS: 1.0000 mg | ORAL_TABLET | Freq: Three times a day (TID) | ORAL | Status: DC

## 2012-03-01 MED ORDER — LISDEXAMFETAMINE DIMESYLATE 70 MG PO CAPS: 70.0000 mg | ORAL_CAPSULE | ORAL | Status: DC

## 2012-03-01 MED ORDER — LAMOTRIGINE 150 MG PO TABS: 150.0000 mg | ORAL_TABLET | Freq: Every day | ORAL | Status: DC

## 2012-03-01 MED ORDER — INSULIN DETEMIR 100 UNIT/ML ~~LOC~~ SOLN: 40.0000 [IU] | Freq: Every day | SUBCUTANEOUS | Status: DC

## 2012-03-01 MED ORDER — BUPROPION HCL ER (XL) 150 MG PO TB24: 450.0000 mg | ORAL_TABLET | ORAL | Status: DC

## 2012-03-01 MED ORDER — INSULIN DETEMIR 100 UNIT/ML ~~LOC~~ SOLN: SUBCUTANEOUS | Status: DC
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
## 2012-03-01 NOTE — Patient Instructions (Addendum)
F/U with Dr. Linford Arnold for Diabetes follow up/ADHD medication.

## 2012-03-04 ENCOUNTER — Ambulatory Visit: Payer: Self-pay | Admitting: Family Medicine

## 2012-03-06 ENCOUNTER — Other Ambulatory Visit: Payer: Self-pay | Admitting: Family Medicine

## 2012-03-09 ENCOUNTER — Other Ambulatory Visit: Payer: Self-pay | Admitting: *Deleted

## 2012-03-17 ENCOUNTER — Other Ambulatory Visit: Payer: Self-pay | Admitting: Family Medicine

## 2012-03-27 ENCOUNTER — Other Ambulatory Visit: Payer: Self-pay | Admitting: Family Medicine

## 2012-04-02 ENCOUNTER — Ambulatory Visit (INDEPENDENT_AMBULATORY_CARE_PROVIDER_SITE_OTHER): Payer: BC Managed Care – PPO | Admitting: Family Medicine

## 2012-04-02 ENCOUNTER — Encounter: Payer: Self-pay | Admitting: Family Medicine

## 2012-04-02 VITALS — BP 131/75 | HR 112 | Ht 64.0 in

## 2012-04-02 DIAGNOSIS — S81809A Unspecified open wound, unspecified lower leg, initial encounter: Secondary | ICD-10-CM

## 2012-04-02 DIAGNOSIS — F909 Attention-deficit hyperactivity disorder, unspecified type: Secondary | ICD-10-CM

## 2012-04-02 DIAGNOSIS — F411 Generalized anxiety disorder: Secondary | ICD-10-CM

## 2012-04-02 DIAGNOSIS — G4700 Insomnia, unspecified: Secondary | ICD-10-CM

## 2012-04-02 DIAGNOSIS — F329 Major depressive disorder, single episode, unspecified: Secondary | ICD-10-CM

## 2012-04-02 MED ORDER — INSULIN DETEMIR 100 UNIT/ML ~~LOC~~ SOLN: SUBCUTANEOUS | Status: DC

## 2012-04-02 MED ORDER — BIMATOPROST 0.03 % EX SOLN: CUTANEOUS | Status: DC

## 2012-04-02 MED ORDER — ALPRAZOLAM 1 MG PO TABS: 1.0000 mg | ORAL_TABLET | Freq: Three times a day (TID) | ORAL | Status: DC

## 2012-04-02 MED ORDER — BUPROPION HCL ER (XL) 150 MG PO TB24: 450.0000 mg | ORAL_TABLET | ORAL | Status: DC

## 2012-04-02 MED ORDER — LISDEXAMFETAMINE DIMESYLATE 70 MG PO CAPS: 70.0000 mg | ORAL_CAPSULE | ORAL | Status: DC
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
## 2012-04-28 ENCOUNTER — Other Ambulatory Visit: Payer: Self-pay | Admitting: *Deleted

## 2012-04-28 MED ORDER — ZOLPIDEM TARTRATE ER 12.5 MG PO TBCR: 12.5000 mg | EXTENDED_RELEASE_TABLET | Freq: Every evening | ORAL | Status: DC | PRN

## 2012-05-07 ENCOUNTER — Other Ambulatory Visit: Payer: Self-pay | Admitting: *Deleted

## 2012-05-07 MED ORDER — LISDEXAMFETAMINE DIMESYLATE 70 MG PO CAPS: 70.0000 mg | ORAL_CAPSULE | ORAL | Status: DC

## 2012-05-11 ENCOUNTER — Ambulatory Visit: Payer: Self-pay | Admitting: Family Medicine

## 2012-05-12 ENCOUNTER — Inpatient Hospital Stay (HOSPITAL_COMMUNITY)
Admission: AD | Admit: 2012-05-12 | Discharge: 2012-05-14 | DRG: 277 | Disposition: A | Discharge status: Home / Self Care | Payer: BC Managed Care – PPO | Source: Ambulatory Visit | Attending: Orthopedic Surgery | Admitting: Orthopedic Surgery

## 2012-05-12 ENCOUNTER — Encounter (HOSPITAL_COMMUNITY): Payer: Self-pay | Admitting: General Practice

## 2012-05-12 DIAGNOSIS — F4310 Post-traumatic stress disorder, unspecified: Secondary | ICD-10-CM | POA: Diagnosis present

## 2012-05-12 DIAGNOSIS — F3289 Other specified depressive episodes: Secondary | ICD-10-CM | POA: Diagnosis present

## 2012-05-12 DIAGNOSIS — L03116 Cellulitis of left lower limb: Secondary | ICD-10-CM

## 2012-05-12 DIAGNOSIS — N183 Chronic kidney disease, stage 3 unspecified: Secondary | ICD-10-CM | POA: Diagnosis present

## 2012-05-12 DIAGNOSIS — T798XXA Other early complications of trauma, initial encounter: Secondary | ICD-10-CM

## 2012-05-12 DIAGNOSIS — L97509 Non-pressure chronic ulcer of other part of unspecified foot with unspecified severity: Secondary | ICD-10-CM | POA: Diagnosis present

## 2012-05-12 DIAGNOSIS — E1129 Type 2 diabetes mellitus with other diabetic kidney complication: Secondary | ICD-10-CM | POA: Diagnosis present

## 2012-05-12 DIAGNOSIS — F411 Generalized anxiety disorder: Secondary | ICD-10-CM | POA: Diagnosis present

## 2012-05-12 DIAGNOSIS — L089 Local infection of the skin and subcutaneous tissue, unspecified: Secondary | ICD-10-CM

## 2012-05-12 DIAGNOSIS — L02419 Cutaneous abscess of limb, unspecified: Principal | ICD-10-CM | POA: Diagnosis present

## 2012-05-12 DIAGNOSIS — Z79899 Other long term (current) drug therapy: Secondary | ICD-10-CM

## 2012-05-12 DIAGNOSIS — Z794 Long term (current) use of insulin: Secondary | ICD-10-CM

## 2012-05-12 LAB — GLUCOSE, CAPILLARY
Glucose-Capillary: 151 mg/dL — ABNORMAL HIGH (ref 70–99)
Glucose-Capillary: 232 mg/dL — ABNORMAL HIGH (ref 70–99)

## 2012-05-12 LAB — CBC
MCH: 24.1 pg — ABNORMAL LOW (ref 26.0–34.0)
MCHC: 32 g/dL (ref 30.0–36.0)
RDW: 16.8 % — ABNORMAL HIGH (ref 11.5–15.5)

## 2012-05-12 LAB — BASIC METABOLIC PANEL
BUN: 39 mg/dL — ABNORMAL HIGH (ref 6–23)
Calcium: 9.5 mg/dL (ref 8.4–10.5)
Creatinine, Ser: 1.9 mg/dL — ABNORMAL HIGH (ref 0.50–1.10)
GFR calc Af Amer: 37 mL/min — ABNORMAL LOW (ref >90)
GFR calc non Af Amer: 32 mL/min — ABNORMAL LOW (ref >90)

## 2012-05-12 MED ORDER — SILVER SULFADIAZINE 1 % EX CREA
TOPICAL_CREAM | Freq: Every day | CUTANEOUS | Status: DC
  Administered 2012-05-12: 19:00:00 via TOPICAL
  Administered 2012-05-13: 1 via TOPICAL
  Administered 2012-05-14: 11:00:00 via TOPICAL
  Filled 2012-05-12: qty 85

## 2012-05-12 MED ORDER — LISDEXAMFETAMINE DIMESYLATE 70 MG PO CAPS
70.0000 mg | ORAL_CAPSULE | Freq: Every day | ORAL | Status: DC
  Filled 2012-05-12: qty 1

## 2012-05-12 MED ORDER — INSULIN ASPART 100 UNIT/ML ~~LOC~~ SOLN: 0.0000 [IU] | Freq: Every day | SUBCUTANEOUS | Status: DC

## 2012-05-12 MED ORDER — CLINDAMYCIN PHOSPHATE 600 MG/50ML IV SOLN
600.0000 mg | Freq: Three times a day (TID) | INTRAVENOUS | Status: DC
  Filled 2012-05-12 (×2): qty 50

## 2012-05-12 MED ORDER — ALPRAZOLAM 0.5 MG PO TABS
1.0000 mg | ORAL_TABLET | Freq: Three times a day (TID) | ORAL | Status: DC | PRN
  Administered 2012-05-12 – 2012-05-13 (×4): 1 mg via ORAL
  Filled 2012-05-12 (×4): qty 2

## 2012-05-12 MED ORDER — HYDROCODONE-ACETAMINOPHEN 5-325 MG PO TABS
1.0000 | ORAL_TABLET | ORAL | Status: DC | PRN
  Administered 2012-05-12 – 2012-05-14 (×5): 2 via ORAL
  Filled 2012-05-12 (×5): qty 2

## 2012-05-12 MED ORDER — CLINDAMYCIN PHOSPHATE 600 MG/50ML IV SOLN
600.0000 mg | Freq: Three times a day (TID) | INTRAVENOUS | Status: DC
  Administered 2012-05-12 – 2012-05-13 (×3): 600 mg via INTRAVENOUS
  Filled 2012-05-12 (×5): qty 50

## 2012-05-12 MED ORDER — VANCOMYCIN HCL 1000 MG IV SOLR
750.0000 mg | INTRAVENOUS | Status: DC
  Administered 2012-05-13: 750 mg via INTRAVENOUS
  Filled 2012-05-12 (×2): qty 750

## 2012-05-12 MED ORDER — INSULIN ASPART 100 UNIT/ML ~~LOC~~ SOLN
0.0000 [IU] | Freq: Three times a day (TID) | SUBCUTANEOUS | Status: DC
  Administered 2012-05-13: 11 [IU] via SUBCUTANEOUS
  Administered 2012-05-14: 15 [IU] via SUBCUTANEOUS

## 2012-05-12 MED ORDER — VANCOMYCIN HCL 1000 MG IV SOLR
750.0000 mg | Freq: Two times a day (BID) | INTRAVENOUS | Status: DC
  Administered 2012-05-12: 750 mg via INTRAVENOUS
  Filled 2012-05-12 (×2): qty 750

## 2012-05-12 MED ORDER — LISDEXAMFETAMINE DIMESYLATE 70 MG PO CAPS
70.0000 mg | ORAL_CAPSULE | Freq: Every day | ORAL | Status: DC
  Administered 2012-05-13 – 2012-05-14 (×2): 70 mg via ORAL
  Filled 2012-05-12: qty 1

## 2012-05-12 MED ORDER — ONDANSETRON HCL 4 MG PO TABS
4.0000 mg | ORAL_TABLET | Freq: Three times a day (TID) | ORAL | Status: DC | PRN
  Administered 2012-05-13 – 2012-05-14 (×2): 4 mg via ORAL
  Filled 2012-05-12 (×2): qty 1

## 2012-05-12 MED ORDER — INSULIN DETEMIR 100 UNIT/ML ~~LOC~~ SOLN
40.0000 [IU] | Freq: Two times a day (BID) | SUBCUTANEOUS | Status: DC
  Administered 2012-05-13 – 2012-05-14 (×2): 40 [IU] via SUBCUTANEOUS
  Filled 2012-05-12 (×5): qty 0.4

## 2012-05-12 NOTE — Progress Notes (Signed)
Discussed PICC placement including risks, benefits, and alternatives. Patient signed consent but asked to have PICC placed in AM. Primary RN Charica notified. Christeen Douglas RN [facility]
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
## 2012-05-13 DIAGNOSIS — L089 Local infection of the skin and subcutaneous tissue, unspecified: Secondary | ICD-10-CM

## 2012-05-13 LAB — GLUCOSE, CAPILLARY
Glucose-Capillary: 89 mg/dL (ref 70–99)
Glucose-Capillary: 60 mg/dL — ABNORMAL LOW (ref 70–99)
Glucose-Capillary: 64 mg/dL — ABNORMAL LOW (ref 70–99)

## 2012-05-13 MED ORDER — ZOLPIDEM TARTRATE 5 MG PO TABS
5.0000 mg | ORAL_TABLET | Freq: Every evening | ORAL | Status: DC | PRN
  Administered 2012-05-13: 5 mg via ORAL
  Filled 2012-05-13: qty 1

## 2012-05-13 MED ORDER — LEVOFLOXACIN 750 MG PO TABS
750.0000 mg | ORAL_TABLET | ORAL | Status: DC
  Administered 2012-05-13: 750 mg via ORAL
  Filled 2012-05-13 (×2): qty 1

## 2012-05-13 MED ORDER — SODIUM CHLORIDE 0.9 % IJ SOLN
10.0000 mL | INTRAMUSCULAR | Status: DC | PRN
  Administered 2012-05-14 (×2): 10 mL

## 2012-05-13 NOTE — Progress Notes (Signed)
Hypoglycemic Event  CBG: 64  Treatment: 15 GM carbohydrate snack- 2 apple juices  Symptoms: None  Follow-up CBG: Time:1830 CBG Result :92  Possible Reasons for Event: Medication Regimen Comments/MD notified:    O'BRIEN, Melissa [name]  Remember to initiate Hypoglycemia Order Set & complete

## 2012-05-13 NOTE — Progress Notes (Signed)
Inpatient Diabetes Program Recommendations  AACE/ADA: New Consensus Statement on Inpatient Glycemic Control (2013)  Target Ranges:  Prepandial:   less than 140 mg/dL      Peak postprandial:   less than 180 mg/dL (1-2 hours)      Critically ill patients:  140 - 180 mg/dL   Results for [place] (MRN [id number]) as of 05/13/2012 13:01  Ref. Range 05/12/2012 21:48 05/12/2012 23:51 05/13/2012 06:54 05/13/2012 11:24 05/13/2012 12:52  Glucose-Capillary Latest Range: 70-99 mg/dL 409 (H) 811 (H) 914 (H) 89 60 (L)    Inpatient Diabetes Program Recommendations Insulin - Basal: If patient is not eating well, may want to decrease basal insulin to prevent subsequental hypoglycemic events. Correction (SSI): Please consider decreasing Novolog correction to sensitive scale. HgbA1C: Please consider ordering an A1C to determine glycemic control over the last 2-3 months. Diet: Please consider changing diet to Carbohydrate Modified Diabetic diet.  Note: Patient has a history of diabetes and takes Levemir 36-42 units QAM and Levemir 26-40 units QPM at home for diabetes management.  Currently, patient is ordered to receive  Levemir 40 units BID and Novolog moderate correction TID before meals for inpatient glycemic control. Noted that patient did not receive Levemir last night on 3/19, nurse charted "Refused by patient" at 23:00.  Fasting blood glucose this morning was 330 mg/dl and patient received Novolog 11 units at 8:24 am.  Then CBG at 11:24 was 89 mg/dl and CBG at 78:29 was 60 mg/dl.  Please consider decreasing Levemir (especially if patient is not eating well), decrease Novolog correction scale to sensitive, order an A1C (last A1C in the chart was 7.3% on 02/05/2012), and change diet to Carbohydrate Modified Diabetic diet.  Will continue to follow.  Thanks, Orlando Penner, RN, BSN, CCRN Diabetes Coordinator Inpatient Diabetes Program [phone]

## 2012-05-13 NOTE — Progress Notes (Signed)
Peripherally Inserted Central Catheter/Midline Placement  The IV Nurse has discussed with the patient and/or persons authorized to consent for the patient, the purpose of this procedure and the potential benefits and risks involved with this procedure.  The benefits include less needle sticks, lab draws from the catheter and patient may be discharged home with the catheter.  Risks include, but not limited to, infection, bleeding, blood clot (thrombus formation), and puncture of an artery; nerve damage and irregular heat beat.  Alternatives to this procedure were also discussed.  PICC/Midline Placement Documentation        Stacie Glaze Horton 05/13/2012, 11:13 AM

## 2012-05-13 NOTE — Progress Notes (Signed)
Hypoglycemic Event  CBG: 54  Treatment: 15 GM carbohydrate snack  Symptoms: None  Follow-up CBG: Time:1610 CBG Result:85   Possible Reasons for Event:  Comments/MD notified:    O'BRIEN, Camyla Camposano P  Remember to initiate Hypoglycemia Order Set & complete

## 2012-05-13 NOTE — Progress Notes (Signed)
Hypoglycemic Event  CBG: 60  Treatment: 15 GM carbohydrate snack  Symptoms: None  Follow-up CBG: Time:1315 CBG Result: 102  Possible Reasons for Event: Medication regimen: Levemir  Comments/MD notified: Patient given additional snack along with lunch. Will continue to monitor.    O'BRIEN, Kalyn Hofstra P  Remember to initiate Hypoglycemia Order Set & complete
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
## 2012-05-13 NOTE — Progress Notes (Signed)
Patient ID: Melissa [name] [place], female   DOB: 08/08/1970, 42 y.o.   MRN: [id number] Day 1 of IV antibiotics including vancomycin and clindamycin. We'll consult infectious disease for discharge antibiotics. Placement of PICC line today.

## 2012-05-13 NOTE — Progress Notes (Signed)
Utilization Review Completed.   Sephiroth Mcluckie, RN, BSN Nurse Case Manager  [phone]  

## 2012-05-14 LAB — BASIC METABOLIC PANEL
Calcium: 8.8 mg/dL (ref 8.4–10.5)
Creatinine, Ser: 1.58 mg/dL — ABNORMAL HIGH (ref 0.50–1.10)
GFR calc Af Amer: 46 mL/min — ABNORMAL LOW (ref >90)
GFR calc non Af Amer: 40 mL/min — ABNORMAL LOW (ref >90)
Sodium: 133 mEq/L — ABNORMAL LOW (ref 135–145)

## 2012-05-14 LAB — GLUCOSE, CAPILLARY
Glucose-Capillary: 152 mg/dL — ABNORMAL HIGH (ref 70–99)
Glucose-Capillary: 358 mg/dL — ABNORMAL HIGH (ref 70–99)
Glucose-Capillary: 257 mg/dL — ABNORMAL HIGH (ref 70–99)

## 2012-05-14 MED ORDER — LEVOFLOXACIN 750 MG PO TABS: 750.0000 mg | ORAL_TABLET | ORAL | Status: DC

## 2012-05-14 MED ORDER — HYDROCODONE-ACETAMINOPHEN 5-325 MG PO TABS: 1.0000 | ORAL_TABLET | ORAL | Status: DC | PRN

## 2012-05-14 MED ORDER — VANCOMYCIN HCL 1000 MG IV SOLR: 750.0000 mg | INTRAVENOUS | Status: DC

## 2012-05-14 MED ORDER — HEPARIN SOD (PORK) LOCK FLUSH 100 UNIT/ML IV SOLN
250.0000 [IU] | INTRAVENOUS | Status: AC | PRN
  Administered 2012-05-14: 500 [IU]

## 2012-05-14 MED ORDER — ONDANSETRON HCL 4 MG PO TABS: 4.0000 mg | ORAL_TABLET | Freq: Three times a day (TID) | ORAL | Status: DC | PRN

## 2012-05-14 MED ORDER — PROMETHAZINE HCL 25 MG/ML IJ SOLN
12.5000 mg | Freq: Three times a day (TID) | INTRAMUSCULAR | Status: DC | PRN
  Administered 2012-05-14: 12.5 mg via INTRAVENOUS
  Filled 2012-05-14: qty 1

## 2012-05-14 MED ORDER — SILVER SULFADIAZINE 1 % EX CREA: TOPICAL_CREAM | Freq: Every day | CUTANEOUS | Status: DC

## 2012-05-14 NOTE — Discharge Summary (Signed)
Physician Discharge Summary  Patient ID: Melissa [name] [place] MRN: [id number] DOB/AGE: 1971-01-27 42 y.o.  Admit date: 05/12/2012 Discharge date: 05/14/2012  Admission Diagnoses: Cellulitis left leg and open wounds left foot and ankle  Discharge Diagnoses: Cellulitis left leg Active Problems:   Post-traumatic wound infection   Cellulitis of leg, left   Discharged Condition: stable  Hospital Course: Patient's hospital course was essentially unremarkable she underwent infectious disease consult is currently on vancomycin and Levaquin for long-term treatment for her cellulitis. Patient progressed well and was discharged to home in stable condition  Consults: ID  Significant Diagnostic Studies: labs: Routine labs  Treatments: antibiotics: vancomycin and Levaquin  Discharge Exam: Blood pressure 112/63, pulse 83, temperature 98 F (36.7 C), temperature source Oral, resp. rate 18, height 5' 4.5" (1.638 m), weight 65.772 kg (145 lb), SpO2 98.00%. Incision/Wound: wounds clean and dry with improvement in her cellulitis  Disposition: 06-Home-Health Care Svc  Discharge Orders   Future Orders Complete By Expires     Ambulatory referral to Home Health  As directed     Comments:      Please evaluate Kamille [place] for admission to [facility].  Disciplines requested: Nursing and Home Health Aide  Services to provide: IV Antibiotics and Wound/Ostomy Care  Physician to follow patient's care (the person listed here will be responsible for signing ongoing orders): Referring Provider  Requested Start of Care Date: Today (Please call ahead to confirm availability [phone])  Special Instructions:  IV vancomycin therapy at home. Wound care left leg Silvadene dressing changes to foot and leg wounds 3 times a week.    Call MD / Call 911  As directed     Comments:      If you experience chest pain or shortness of breath, CALL 911 and be transported to the hospital emergency room.  If you develope  a fever above 101 F, pus (white drainage) or increased drainage or redness at the wound, or calf pain, call your surgeon's office.    Constipation Prevention  As directed     Comments:      Drink plenty of fluids.  Prune juice may be helpful.  You may use a stool softener, such as Colace (over the counter) 100 mg twice a day.  Use MiraLax (over the counter) for constipation as needed.    Diet - low sodium heart healthy  As directed     Increase activity slowly as tolerated  As directed         Medication List    TAKE these medications       albuterol 108 (90 BASE) MCG/ACT inhaler  Commonly known as:  PROVENTIL HFA;VENTOLIN HFA  Inhale 2 puffs into the lungs every 6 (six) hours as needed. For shortness of breath     ALPRAZolam 1 MG tablet  Commonly known as:  XANAX  Take 1 tablet (1 mg total) by mouth 3 (three) times daily.     azelastine 137 MCG/SPRAY nasal spray  Commonly known as:  ASTELIN  Place 1 spray into the nose 2 (two) times daily as needed for rhinitis. Use in each nostril as directed     buPROPion 150 MG 24 hr tablet  Commonly known as:  WELLBUTRIN XL  Take 3 tablets (450 mg total) by mouth every morning.     HYDROcodone-acetaminophen 5-325 MG per tablet  Commonly known as:  NORCO/VICODIN  Take 1 tablet by mouth every 6 (six) hours as needed for pain.     HYDROcodone-acetaminophen 5-325  MG per tablet  Commonly known as:  NORCO/VICODIN  Take 1-2 tablets by mouth every 4 (four) hours as needed.     insulin detemir 100 UNIT/ML injection  Commonly known as:  LEVEMIR  Inject 26-42 Units into the skin 2 (two) times daily. Pt will inject between 36 to 42 units in the a.m. Depending on B.Sugar, and at night pt is on sliding scale using 26 to 40 at night     lamoTRIgine 150 MG tablet  Commonly known as:  LAMICTAL  Take 1 tablet (150 mg total) by mouth daily.     levofloxacin 750 MG tablet  Commonly known as:  LEVAQUIN  Take 1 tablet (750 mg total) by mouth every other  day.     lisdexamfetamine 70 MG capsule  Commonly known as:  VYVANSE  Take 1 capsule (70 mg total) by mouth every morning.     loratadine-pseudoephedrine 10-240 MG per 24 hr tablet  Commonly known as:  CLARITIN-D 24-hour  Take 1 tablet by mouth daily as needed for allergies.     multivitamin with minerals Tabs  Take 1 tablet by mouth daily.     ondansetron 4 MG tablet  Commonly known as:  ZOFRAN  Take 1 tablet (4 mg total) by mouth every 8 (eight) hours as needed.     promethazine 25 MG tablet  Commonly known as:  PHENERGAN  Take 1 tablet (25 mg total) by mouth every 6 (six) hours as needed for nausea.     silver sulfADIAZINE 1 % cream  Commonly known as:  SILVADENE  Apply topically daily.     sodium chloride 0.9 % SOLN 150 mL with vancomycin 1000 MG SOLR 750 mg  Inject 750 mg into the vein daily.     zolpidem 12.5 MG CR tablet  Commonly known as:  AMBIEN CR  Take 1 tablet (12.5 mg total) by mouth at bedtime as needed. As needed for sleep           Follow-up Information   Follow up with DUDA,MARCUS V, MD In 1 week.   Contact information:   [address] [place] [address] [phone]       Signed: Nadara Mustard 05/14/2012, 6:55 AM

## 2012-05-14 NOTE — Progress Notes (Signed)
Patient discharged in stable condition via wheelchair to home. Discharge instructions and prescriptions were given and explained. 

## 2012-05-28 ENCOUNTER — Other Ambulatory Visit (HOSPITAL_BASED_OUTPATIENT_CLINIC_OR_DEPARTMENT_OTHER): Payer: Self-pay | Admitting: General Surgery

## 2012-05-28 ENCOUNTER — Ambulatory Visit (HOSPITAL_COMMUNITY)
Admission: RE | Admit: 2012-05-28 | Discharge: 2012-05-28 | Disposition: A | Discharge status: Home / Self Care | Payer: BC Managed Care – PPO | Source: Ambulatory Visit | Attending: General Surgery | Admitting: General Surgery

## 2012-05-28 ENCOUNTER — Encounter (HOSPITAL_BASED_OUTPATIENT_CLINIC_OR_DEPARTMENT_OTHER): Payer: BC Managed Care – PPO | Attending: General Surgery

## 2012-05-28 DIAGNOSIS — T148XXA Other injury of unspecified body region, initial encounter: Secondary | ICD-10-CM

## 2012-05-28 DIAGNOSIS — E119 Type 2 diabetes mellitus without complications: Secondary | ICD-10-CM | POA: Insufficient documentation

## 2012-05-28 DIAGNOSIS — S91009A Unspecified open wound, unspecified ankle, initial encounter: Secondary | ICD-10-CM | POA: Insufficient documentation

## 2012-05-28 DIAGNOSIS — Z794 Long term (current) use of insulin: Secondary | ICD-10-CM | POA: Insufficient documentation

## 2012-05-28 DIAGNOSIS — S91109A Unspecified open wound of unspecified toe(s) without damage to nail, initial encounter: Secondary | ICD-10-CM | POA: Insufficient documentation

## 2012-05-28 DIAGNOSIS — S81009A Unspecified open wound, unspecified knee, initial encounter: Secondary | ICD-10-CM | POA: Insufficient documentation

## 2012-05-28 DIAGNOSIS — L97509 Non-pressure chronic ulcer of other part of unspecified foot with unspecified severity: Secondary | ICD-10-CM | POA: Insufficient documentation

## 2012-05-28 DIAGNOSIS — J984 Other disorders of lung: Secondary | ICD-10-CM | POA: Insufficient documentation

## 2012-05-28 DIAGNOSIS — M869 Osteomyelitis, unspecified: Secondary | ICD-10-CM

## 2012-05-28 DIAGNOSIS — M4120 Other idiopathic scoliosis, site unspecified: Secondary | ICD-10-CM | POA: Insufficient documentation

## 2012-05-28 DIAGNOSIS — X58XXXA Exposure to other specified factors, initial encounter: Secondary | ICD-10-CM | POA: Insufficient documentation

## 2012-05-29 ENCOUNTER — Other Ambulatory Visit: Payer: Self-pay | Admitting: Family Medicine
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
## 2012-05-31 ENCOUNTER — Encounter: Payer: Self-pay | Admitting: Internal Medicine

## 2012-05-31 ENCOUNTER — Ambulatory Visit (INDEPENDENT_AMBULATORY_CARE_PROVIDER_SITE_OTHER): Payer: BC Managed Care – PPO | Admitting: Internal Medicine

## 2012-05-31 VITALS — BP 159/92 | HR 108 | Temp 98.0°F | Ht 64.0 in | Wt 143.0 lb

## 2012-05-31 DIAGNOSIS — M869 Osteomyelitis, unspecified: Secondary | ICD-10-CM

## 2012-05-31 MED ORDER — LEVOFLOXACIN 750 MG PO TABS: 750.0000 mg | ORAL_TABLET | ORAL | Status: DC

## 2012-05-31 NOTE — Assessment & Plan Note (Signed)
Based on the x-ray with no destruction, possible bone exposure and difficulty in healing, I am going to extend her therapy to complete a course for osteomyelitis, rather than treating just the cellulitis. She is going to complete 6 weeks of vancomycin and Levaquin. Levaquin will continue every other day based on her renal insufficiency. Home health will continue to monitor and send me the labs. She will continue with wound care and I believe she will start hyperbaric oxygen soon. She will return in about 3 weeks presented for therapy he'll consider stopping her therapy at that time.

## 2012-05-31 NOTE — Progress Notes (Signed)
  Subjective:    Patient ID: Melissa [name] [place], female    DOB: September 10, 1970, 42 y.o.   MRN: [id number]  HPI She comes in here for hospital followup. She has been following Dr. Lajoyce Corners to for chronic leg wound including grafting and her course has been exacerbated by picking at the wound and pulling off toenails.  She was started on IV antibiotics during her hospitalization empirically with vancomycin and oral Levaquin and comes in here for followup.  She tells me that she stopped going to see Dr. Lajoyce Corners and is under the care of Dr. Jimmey Ralph of wound care. She has been taking the antibiotics and vancomycin through her PICC line well with no complications included no diarrhea, no rashes, or difficulty with urination. She has some renal insufficiency at baseline and so has been on every other day Levaquin and vancomycin dosing per protocol. She has noted improvement in her left leg. She did get an x-ray of her left foot from wound care and the left toe was noted to have destruction. She denies fever or chills. Her left toe does show significant tissue damage.   Review of Systems  Constitutional: Negative for fever, chills and fatigue.  Gastrointestinal: Negative for nausea, abdominal pain and diarrhea.  Genitourinary: Negative for frequency and difficulty urinating.  Skin: Negative for rash.  Neurological: Negative for dizziness, light-headedness and headaches.       Objective:   Physical Exam  Constitutional: She appears well-developed and well-nourished. No distress.  Skin:  Left lower extremity with improved erythema, no warmth, some nonpitting edema from the ankle down. Left second toe with obvious tissue destruction and soft tissue exposure.          Assessment & Plan:
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
## 2012-06-02 ENCOUNTER — Encounter: Payer: Self-pay | Admitting: Physician Assistant

## 2012-06-02 ENCOUNTER — Ambulatory Visit (INDEPENDENT_AMBULATORY_CARE_PROVIDER_SITE_OTHER): Payer: BC Managed Care – PPO | Admitting: Physician Assistant

## 2012-06-02 VITALS — BP 156/89 | HR 106 | Wt 142.0 lb

## 2012-06-02 DIAGNOSIS — S81802D Unspecified open wound, left lower leg, subsequent encounter: Secondary | ICD-10-CM

## 2012-06-02 DIAGNOSIS — F411 Generalized anxiety disorder: Secondary | ICD-10-CM

## 2012-06-02 DIAGNOSIS — F329 Major depressive disorder, single episode, unspecified: Secondary | ICD-10-CM

## 2012-06-02 DIAGNOSIS — B373 Candidiasis of vulva and vagina: Secondary | ICD-10-CM

## 2012-06-02 DIAGNOSIS — F909 Attention-deficit hyperactivity disorder, unspecified type: Secondary | ICD-10-CM

## 2012-06-02 DIAGNOSIS — B3731 Acute candidiasis of vulva and vagina: Secondary | ICD-10-CM

## 2012-06-02 DIAGNOSIS — G4700 Insomnia, unspecified: Secondary | ICD-10-CM

## 2012-06-02 DIAGNOSIS — M869 Osteomyelitis, unspecified: Secondary | ICD-10-CM

## 2012-06-02 DIAGNOSIS — F3289 Other specified depressive episodes: Secondary | ICD-10-CM

## 2012-06-02 DIAGNOSIS — I10 Essential (primary) hypertension: Secondary | ICD-10-CM

## 2012-06-02 MED ORDER — ZOLPIDEM TARTRATE ER 12.5 MG PO TBCR: 12.5000 mg | EXTENDED_RELEASE_TABLET | Freq: Every evening | ORAL | Status: DC | PRN

## 2012-06-02 MED ORDER — HYDROCHLOROTHIAZIDE 12.5 MG PO CAPS: 12.5000 mg | ORAL_CAPSULE | Freq: Every day | ORAL | Status: DC

## 2012-06-02 MED ORDER — LAMOTRIGINE 150 MG PO TABS: 150.0000 mg | ORAL_TABLET | Freq: Every day | ORAL | Status: DC

## 2012-06-02 MED ORDER — FLUCONAZOLE 150 MG PO TABS: ORAL_TABLET | ORAL | Status: DC

## 2012-06-02 MED ORDER — BUPROPION HCL ER (XL) 150 MG PO TB24: 450.0000 mg | ORAL_TABLET | ORAL | Status: DC

## 2012-06-02 MED ORDER — ALPRAZOLAM 1 MG PO TABS: 1.0000 mg | ORAL_TABLET | Freq: Three times a day (TID) | ORAL | Status: DC

## 2012-06-02 MED ORDER — LISDEXAMFETAMINE DIMESYLATE 70 MG PO CAPS: 70.0000 mg | ORAL_CAPSULE | ORAL | Status: DC

## 2012-06-03 NOTE — Progress Notes (Signed)
  Subjective:    Patient ID: Melissa [name], female    DOB: 1970-08-12, 42 y.o.   MRN: [id number]  HPI The patient is a-year-old female who presents to the clinic for medication refills.  ADHD was well-controlled on Vyvanse. She denies any anorexia, CP, palpitations, SOB. She has chronic insomnia but vyvanse has not make worse. Patient reports that she is able to focus much better than without the stimulant. She reports this also feels like it's helping her overall mood.  Hypertension-patient's blood pressure is increased today. Patient reports to be taken all of blood pressure medications daily. Patient reports that she is trying to keep a low salt diet. She is not able to exercise due to her osteomyelitis and being in a cam boot. Denies any chest pains palpitations shortness of breath or headaches.  Anxiety and depression -patient reports to be well controlled on current meds of Wellbutrin, Lamictal,. She's previously been seen by psychiatrists but he really started due to not showing up for some appointments. Right now Dr. Glade Lloyd agreed to take over prescribing medications until she could get back in with a psychiatrist. She denies any feelings of hopelessness or helplessness.  Insomnia-well controlled with Ambien patient is able to sleep of long as she takes Ambien nightly.  Osteomyelitis with yeast infection-patient has experienced a lot of overgrowth of the since on antibiotics daily for the last couple weeks. She has a lot of itching and white clumpy discharge. She has tried over-the-counter agents and they have only helped minimally.   Review of Systems     Objective:   Physical Exam  Constitutional: She is oriented to person, place, and time. She appears well-developed and well-nourished.  HENT:  Head: Normocephalic and atraumatic.  Cardiovascular: Regular rhythm and normal heart sounds.   Tachycardia at 106.   Pulmonary/Chest: Effort normal and breath sounds normal.   Musculoskeletal:  Left foot in a cam boot.  Neurological: She is alert and oriented to person, place, and time.  Skin: Skin is warm and dry.  Psychiatric: She has a normal mood and affect. Her behavior is normal.          Assessment & Plan:  Anxiety/depression- refilled Wellbutrin, Lamictal, and xanax prn. Follow up in 3 months. Per dr. Shelah Lewandowsky note she would prescribe for 2-3 months and then she wanted her to see a psychiatrist to manage medications.   HTN- Refilled HCTZ.BP elevated today. She is undergoing treatment for osteomyleitis. Will continue to monitor. Reminded of low salt diet.   ADHD- refilled vyvanse for 1 month. Well controlled.   Insomnia- refilled ambien.   Osteomyelitis of 2nd toe/wound of leg- pt is getting yeast infections from abx usage. She has 4 weeks of treatment left. Gave her diflucian to take once weekly and one after finishing treatment. Being managed by Dr. Lajoyce Corners.

## 2012-06-09 ENCOUNTER — Telehealth: Payer: Self-pay | Admitting: *Deleted

## 2012-06-09 NOTE — Telephone Encounter (Signed)
Pt having issues, ie., door shut on IV tubing, and dressing needing changed more frequently than weekly, where [facility] nurse needs to visit home.  [facility] RN requesting order for additional home visits.  Authorized additional nursing visits.

## 2012-06-09 NOTE — Telephone Encounter (Signed)
Where?

## 2012-06-12 ENCOUNTER — Telehealth: Payer: Self-pay | Admitting: Infectious Disease

## 2012-06-12 NOTE — Telephone Encounter (Signed)
Called by [facility] re pt having tinnitus and worsened renal fxn after having HCTZ added this past week,. CR up to 1.9 Vanco troughs at 17.  I am concerned for vanco toxicity given tinnitus and have asked we change to DAPTOMYCIN at 6mg /kg   HCTZ also being held

## 2012-06-14 NOTE — Telephone Encounter (Signed)
[facility] will be sending order for these requested additional home visits.

## 2012-06-16 ENCOUNTER — Telehealth: Payer: Self-pay | Admitting: *Deleted

## 2012-06-16 MED ORDER — HYDROCHLOROTHIAZIDE 12.5 MG PO CAPS: ORAL_CAPSULE | ORAL | Status: DC

## 2012-06-16 NOTE — Telephone Encounter (Signed)
Patient calls and states she is taking 2 of the HCTZ to help control swelling and states you told her this would be ok. Pt needs refill on the HCTZ sent to [facility] [address] [facility]. Barry Dienes, LPN

## 2012-06-16 NOTE — Telephone Encounter (Signed)
Sent to pharm ....

## 2012-06-17 ENCOUNTER — Encounter: Payer: Self-pay | Admitting: Internal Medicine

## 2012-06-17 LAB — GLUCOSE, CAPILLARY
Glucose-Capillary: 245 mg/dL — ABNORMAL HIGH (ref 70–99)
Glucose-Capillary: 114 mg/dL — ABNORMAL HIGH (ref 70–99)

## 2012-06-18 ENCOUNTER — Telehealth: Payer: Self-pay | Admitting: Sports Medicine

## 2012-06-18 LAB — GLUCOSE, CAPILLARY
Glucose-Capillary: 137 mg/dL — ABNORMAL HIGH (ref 70–99)
Glucose-Capillary: 75 mg/dL (ref 70–99)

## 2012-06-18 MED ORDER — INSULIN ASPART 100 UNIT/ML FLEXPEN: SUBCUTANEOUS | Status: DC

## 2012-06-18 NOTE — Telephone Encounter (Signed)
Recent antibiotic has BS  elevated wants script for Novolog called in/[facility] [address]

## 2012-06-18 NOTE — Telephone Encounter (Signed)
rx sent

## 2012-06-21 ENCOUNTER — Encounter: Payer: Self-pay | Admitting: Internal Medicine

## 2012-06-21 ENCOUNTER — Ambulatory Visit (INDEPENDENT_AMBULATORY_CARE_PROVIDER_SITE_OTHER): Payer: BC Managed Care – PPO | Admitting: Internal Medicine

## 2012-06-21 VITALS — Temp 98.6°F | Wt 155.0 lb

## 2012-06-21 DIAGNOSIS — S81802D Unspecified open wound, left lower leg, subsequent encounter: Secondary | ICD-10-CM

## 2012-06-21 DIAGNOSIS — Z5189 Encounter for other specified aftercare: Secondary | ICD-10-CM

## 2012-06-21 NOTE — Progress Notes (Signed)
  Subjective:    Patient ID: [place], female    DOB: 10-27-1970, 42 y.o.   MRN: [id number]  HPI She comes in for follow up of her wound infection and concern for osteomyelitis.  She has been on empiric vancomycin and levaquin.  She has been following Dr. Lajoyce Corners to for chronic leg wound including grafting and her course has been exacerbated by picking at the wound and pulling off toenails. She since has stopped seeing Dr. Lajoyce Corners.  She was started on IV antibiotics during her hospitalization empirically with vancomycin and oral Levaquin and comes in here for followup, now 6 weeks into her course. She tells me that she stopped going to see Dr. Lajoyce Corners and is under the care of Dr. Jimmey Ralph of wound care. She has been taking the antibiotics and vancomycin through her PICC line well with no complications included no diarrhea, no rashes, or difficulty with urination. She has some renal insufficiency at baseline and so has been on every other day Levaquin and vancomycin dosing per protocol. She has noted improvement in her left leg. She is going to wound care today so does not want to unwrap her foot     Review of Systems  Constitutional: Negative for fever, chills and fatigue.  Gastrointestinal: Negative for nausea, abdominal pain and diarrhea.  Musculoskeletal: Negative for myalgias, joint swelling and arthralgias.  Skin: Negative for rash.       Objective:   Physical Exam  Constitutional: She appears well-developed and well-nourished. No distress.  Musculoskeletal:  wrapped          Assessment & Plan:

## 2012-06-21 NOTE — Assessment & Plan Note (Signed)
Unfortunately, she does not want me to look at it but describes it as improved with no erythema.  Based on that, I will stop her abx.  Home health to pull her picc since she can't wait now.   Follow up PRN

## 2012-06-22 ENCOUNTER — Encounter: Payer: Self-pay | Admitting: Internal Medicine

## 2012-06-23 ENCOUNTER — Telehealth: Payer: Self-pay | Admitting: *Deleted

## 2012-06-23 LAB — GLUCOSE, CAPILLARY: Glucose-Capillary: 251 mg/dL — ABNORMAL HIGH (ref 70–99)

## 2012-06-23 NOTE — Telephone Encounter (Signed)
Spoke with Dr. Karie Schwalbe & he hasn't received paperwork either.  Called pt & left detailed message on home vm.
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
## 2012-06-24 ENCOUNTER — Encounter (HOSPITAL_BASED_OUTPATIENT_CLINIC_OR_DEPARTMENT_OTHER): Payer: BC Managed Care – PPO | Attending: General Surgery

## 2012-06-24 DIAGNOSIS — L97809 Non-pressure chronic ulcer of other part of unspecified lower leg with unspecified severity: Secondary | ICD-10-CM | POA: Insufficient documentation

## 2012-06-24 DIAGNOSIS — L97509 Non-pressure chronic ulcer of other part of unspecified foot with unspecified severity: Secondary | ICD-10-CM | POA: Insufficient documentation

## 2012-06-24 DIAGNOSIS — E1169 Type 2 diabetes mellitus with other specified complication: Secondary | ICD-10-CM | POA: Insufficient documentation

## 2012-06-24 LAB — GLUCOSE, CAPILLARY: Glucose-Capillary: 63 mg/dL — ABNORMAL LOW (ref 70–99)

## 2012-06-25 LAB — GLUCOSE, CAPILLARY: Glucose-Capillary: 306 mg/dL — ABNORMAL HIGH (ref 70–99)

## 2012-06-28 LAB — GLUCOSE, CAPILLARY
Glucose-Capillary: 199 mg/dL — ABNORMAL HIGH (ref 70–99)
Glucose-Capillary: 159 mg/dL — ABNORMAL HIGH (ref 70–99)

## 2012-06-29 LAB — GLUCOSE, CAPILLARY: Glucose-Capillary: 106 mg/dL — ABNORMAL HIGH (ref 70–99)

## 2012-07-02 LAB — GLUCOSE, CAPILLARY
Glucose-Capillary: 254 mg/dL — ABNORMAL HIGH (ref 70–99)
Glucose-Capillary: 148 mg/dL — ABNORMAL HIGH (ref 70–99)

## 2012-07-05 ENCOUNTER — Telehealth: Payer: Self-pay | Admitting: *Deleted

## 2012-07-05 LAB — GLUCOSE, CAPILLARY: Glucose-Capillary: 299 mg/dL — ABNORMAL HIGH (ref 70–99)

## 2012-07-05 MED ORDER — LISDEXAMFETAMINE DIMESYLATE 70 MG PO CAPS: 70.0000 mg | ORAL_CAPSULE | ORAL | Status: DC

## 2012-07-05 NOTE — Telephone Encounter (Signed)
Pt notified. Rx printed and put up front. Pt had refills at pharmacy for the xanax so ask pharmacist to fill those for the patient. Barry Dienes, LPN

## 2012-07-05 NOTE — Telephone Encounter (Signed)
Will send to Dr. Linford Arnold since her patient.

## 2012-07-05 NOTE — Telephone Encounter (Signed)
Patient calls and wants to know if you would switch her HCTZ to Lasix. States Lasix is more effective than the HCTZ. Also request refill on the Vyvanse and Xanax.

## 2012-07-05 NOTE — Telephone Encounter (Signed)
OK to refill her ADD meds BUt i am not changing her diuretic without seeing her.

## 2012-07-06 LAB — GLUCOSE, CAPILLARY
Glucose-Capillary: 398 mg/dL — ABNORMAL HIGH (ref 70–99)
Glucose-Capillary: 235 mg/dL — ABNORMAL HIGH (ref 70–99)

## 2012-07-07 ENCOUNTER — Encounter: Payer: Self-pay | Admitting: Internal Medicine

## 2012-07-07 LAB — GLUCOSE, CAPILLARY: Glucose-Capillary: 139 mg/dL — ABNORMAL HIGH (ref 70–99)

## 2012-07-08 ENCOUNTER — Encounter: Payer: Self-pay | Admitting: Family Medicine

## 2012-07-08 ENCOUNTER — Ambulatory Visit (INDEPENDENT_AMBULATORY_CARE_PROVIDER_SITE_OTHER): Payer: BC Managed Care – PPO | Admitting: Family Medicine

## 2012-07-08 VITALS — BP 139/89 | HR 94 | Ht 64.0 in | Wt 156.0 lb

## 2012-07-08 DIAGNOSIS — S81802D Unspecified open wound, left lower leg, subsequent encounter: Secondary | ICD-10-CM

## 2012-07-08 DIAGNOSIS — T798XXD Other early complications of trauma, subsequent encounter: Secondary | ICD-10-CM

## 2012-07-08 DIAGNOSIS — Z5189 Encounter for other specified aftercare: Secondary | ICD-10-CM

## 2012-07-08 DIAGNOSIS — I10 Essential (primary) hypertension: Secondary | ICD-10-CM

## 2012-07-08 DIAGNOSIS — N179 Acute kidney failure, unspecified: Secondary | ICD-10-CM

## 2012-07-08 DIAGNOSIS — R609 Edema, unspecified: Secondary | ICD-10-CM

## 2012-07-08 DIAGNOSIS — R600 Localized edema: Secondary | ICD-10-CM

## 2012-07-08 LAB — GLUCOSE, CAPILLARY
Glucose-Capillary: 327 mg/dL — ABNORMAL HIGH (ref 70–99)
Glucose-Capillary: 231 mg/dL — ABNORMAL HIGH (ref 70–99)

## 2012-07-08 MED ORDER — FUROSEMIDE 20 MG PO TABS: 20.0000 mg | ORAL_TABLET | Freq: Every day | ORAL | Status: DC | PRN

## 2012-07-08 NOTE — Progress Notes (Signed)
Subjective:    Patient ID: Melissa [name], female    DOB: 1970-11-09, 42 y.o.   MRN: [id number]  HPI Sees Dr Jimmey Ralph for wound care at the [facility]ic center for wound care for her left leg. Melissa [name] is her nurse.   The are on her toe is improving.  She was put on doxy yesterday.  She is having problems with wound swelling  Say the hctz is working well. Jimmey Ralph originally gave her lasix and say really worked well. She she is requesting a prescription for this today. She also says that Dr. Jimmey Ralph is no longer able to prescribe her hydrocodone to take before her treatments. She would like to have a perception for this as well. She says she still has a few left.  Hypertension-the chest pain or shortness of breath. She says she is interested in a medication that helps improve kidney function and wonders if she would be a candidate for that. Review of Systems     Objective:   Physical Exam  Constitutional: She is oriented to person, place, and time. She appears well-developed and well-nourished.  HENT:  Head: Normocephalic and atraumatic.  Nose: Nose normal.  Eyes: Conjunctivae and EOM are normal. Pupils are equal, round, and reactive to light.  Neck: Neck supple. No thyromegaly present.  Cardiovascular: Normal rate, regular rhythm and normal heart sounds.   Pulmonary/Chest: Effort normal and breath sounds normal. She has no wheezes.  Musculoskeletal: She exhibits edema.  She does have 1+ edema of the left foot.  Lymphadenopathy:    She has no cervical adenopathy.  Neurological: She is alert and oriented to person, place, and time.  Skin: Skin is warm and dry.  Psychiatric: She has a normal mood and affect.          Assessment & Plan:  LE swelling - Discussed t that typically swelling a localized injury is treated with compression. Certainly Lasix could be helpful but with her recent episode of acute renal failure secondary to her vancomycin I'm a little bit hesitant to put her on this on a  regular basis. I did give her 10 tabs to use occasionally. Encouraged her not to use for more than twice a week I would like to see her back in 2 weeks to recheck her kidney and potassium. She will get a baseline kidney and potassium function today. She reports that she had a repeat creatinine done in April which showed it was back down to 1.2. I explained the importance of not causing a second insult to her kidneys especially in such a short period of time. For now she can continue the hydrochlorothiazide. She is not to combine both medications.  Acute renal failure secondary to vancomycin-unfortunately she had gradual increase in her creatinine to 12 weeks that she was treated with vancomycin. It sounds like her creatinine is back down to 1.2 but I would like to recheck her baseline today. I believe the medication that she is asking about is probably an ACE inhibitor. Certainly she could be a candidate for this especially with her diabetes but I need to make sure that her kidney function is back to normal before we start something like this. Next  Diabetes-she is way overdue for diabetic checkup. She has not been very compliant with coming in for appointments for this. She is leaving town tomorrow but I told her that she needs to come back in 2 weeks specifically for diabetic checkup. If her diabetes is not well controlled it will  clearly affect her wound healing. She is still on her insulin.  Wound of the left leg-please see discussion above about using Lasix. I am a little bit hesitant because typically compression is the typical treatment. But I do see in the system her Lasix has been given her before so I gave her a small quantity of 10 tabs to use no more than twice a week at least until I see her back and have a chance to talk with Dr. Jimmey Ralph at the wound care center. As far as taking over her chronic narcotics again, I would like to speak with Dr. Jimmey Ralph that he was previously prescribing this for  her. If I'm going to take over prescribing this then I need to have a good communication with the previous physician about how this is going to be done and she may need to even sign a pain contract.  Time spent 30 minutes, greater than 50% of time counseling about swelling, kidney function, her leg wound and her diabetes.

## 2012-07-12 LAB — GLUCOSE, CAPILLARY: Glucose-Capillary: 224 mg/dL — ABNORMAL HIGH (ref 70–99)

## 2012-07-13 LAB — GLUCOSE, CAPILLARY
Glucose-Capillary: 146 mg/dL — ABNORMAL HIGH (ref 70–99)
Glucose-Capillary: 125 mg/dL — ABNORMAL HIGH (ref 70–99)

## 2012-07-14 ENCOUNTER — Encounter: Payer: Self-pay | Admitting: Internal Medicine

## 2012-07-14 LAB — GLUCOSE, CAPILLARY
Glucose-Capillary: 201 mg/dL — ABNORMAL HIGH (ref 70–99)
Glucose-Capillary: 131 mg/dL — ABNORMAL HIGH (ref 70–99)

## 2012-07-15 LAB — GLUCOSE, CAPILLARY: Glucose-Capillary: 146 mg/dL — ABNORMAL HIGH (ref 70–99)

## 2012-07-20 LAB — GLUCOSE, CAPILLARY: Glucose-Capillary: 64 mg/dL — ABNORMAL LOW (ref 70–99)

## 2012-07-21 LAB — GLUCOSE, CAPILLARY: Glucose-Capillary: 258 mg/dL — ABNORMAL HIGH (ref 70–99)

## 2012-07-22 ENCOUNTER — Ambulatory Visit: Payer: Self-pay | Admitting: Family Medicine

## 2012-07-22 ENCOUNTER — Ambulatory Visit: Payer: Self-pay | Admitting: Sports Medicine

## 2012-07-22 ENCOUNTER — Other Ambulatory Visit: Payer: Self-pay

## 2012-07-22 LAB — GLUCOSE, CAPILLARY
Glucose-Capillary: 68 mg/dL — ABNORMAL LOW (ref 70–99)
Glucose-Capillary: 171 mg/dL — ABNORMAL HIGH (ref 70–99)
Glucose-Capillary: 295 mg/dL — ABNORMAL HIGH (ref 70–99)

## 2012-07-23 ENCOUNTER — Ambulatory Visit: Payer: Self-pay | Admitting: Family Medicine

## 2012-07-26 ENCOUNTER — Ambulatory Visit: Payer: Self-pay | Admitting: Family Medicine

## 2012-07-26 ENCOUNTER — Encounter (HOSPITAL_BASED_OUTPATIENT_CLINIC_OR_DEPARTMENT_OTHER): Payer: BC Managed Care – PPO | Attending: General Surgery

## 2012-07-26 DIAGNOSIS — Z0289 Encounter for other administrative examinations: Secondary | ICD-10-CM

## 2012-07-26 DIAGNOSIS — L97809 Non-pressure chronic ulcer of other part of unspecified lower leg with unspecified severity: Secondary | ICD-10-CM | POA: Insufficient documentation

## 2012-07-26 DIAGNOSIS — L97509 Non-pressure chronic ulcer of other part of unspecified foot with unspecified severity: Secondary | ICD-10-CM | POA: Insufficient documentation

## 2012-07-26 DIAGNOSIS — M869 Osteomyelitis, unspecified: Secondary | ICD-10-CM | POA: Insufficient documentation

## 2012-07-26 DIAGNOSIS — E1169 Type 2 diabetes mellitus with other specified complication: Secondary | ICD-10-CM | POA: Insufficient documentation

## 2012-07-27 LAB — GLUCOSE, CAPILLARY
Glucose-Capillary: 255 mg/dL — ABNORMAL HIGH (ref 70–99)
Glucose-Capillary: 133 mg/dL — ABNORMAL HIGH (ref 70–99)
Glucose-Capillary: 158 mg/dL — ABNORMAL HIGH (ref 70–99)

## 2012-07-28 LAB — GLUCOSE, CAPILLARY
Glucose-Capillary: 367 mg/dL — ABNORMAL HIGH (ref 70–99)
Glucose-Capillary: 279 mg/dL — ABNORMAL HIGH (ref 70–99)

## 2012-08-01 ENCOUNTER — Other Ambulatory Visit: Payer: Self-pay | Admitting: Family Medicine

## 2012-08-02 LAB — GLUCOSE, CAPILLARY
Glucose-Capillary: 128 mg/dL — ABNORMAL HIGH (ref 70–99)
Glucose-Capillary: 80 mg/dL (ref 70–99)

## 2012-08-03 LAB — GLUCOSE, CAPILLARY
Glucose-Capillary: 376 mg/dL — ABNORMAL HIGH (ref 70–99)
Glucose-Capillary: 265 mg/dL — ABNORMAL HIGH (ref 70–99)

## 2012-08-04 ENCOUNTER — Telehealth: Payer: Self-pay | Admitting: Family Medicine

## 2012-08-04 LAB — GLUCOSE, CAPILLARY: Glucose-Capillary: 179 mg/dL — ABNORMAL HIGH (ref 70–99)

## 2012-08-04 NOTE — Telephone Encounter (Signed)
Ok

## 2012-08-04 NOTE — Telephone Encounter (Signed)
Ok with me, if ok with JAde.

## 2012-08-04 NOTE — Telephone Encounter (Signed)
Patient called and scheduled an appointment for this Friday with [facility] and request to transfer Dr. From Melissa [name] to [place] states she can never get in to see Dr .Melissa [name] due to her work schedule. Thanks

## 2012-08-05 ENCOUNTER — Telehealth: Payer: Self-pay | Admitting: *Deleted

## 2012-08-05 LAB — GLUCOSE, CAPILLARY: Glucose-Capillary: 394 mg/dL — ABNORMAL HIGH (ref 70–99)

## 2012-08-06 ENCOUNTER — Ambulatory Visit: Payer: Self-pay | Admitting: Physician Assistant

## 2012-08-06 ENCOUNTER — Ambulatory Visit (INDEPENDENT_AMBULATORY_CARE_PROVIDER_SITE_OTHER): Payer: BC Managed Care – PPO | Admitting: Physician Assistant

## 2012-08-06 ENCOUNTER — Encounter: Payer: Self-pay | Admitting: Physician Assistant

## 2012-08-06 VITALS — BP 150/92 | HR 94 | Wt 159.0 lb

## 2012-08-06 DIAGNOSIS — R030 Elevated blood-pressure reading, without diagnosis of hypertension: Secondary | ICD-10-CM

## 2012-08-06 DIAGNOSIS — F909 Attention-deficit hyperactivity disorder, unspecified type: Secondary | ICD-10-CM

## 2012-08-06 DIAGNOSIS — E119 Type 2 diabetes mellitus without complications: Secondary | ICD-10-CM

## 2012-08-06 LAB — GLUCOSE, CAPILLARY: Glucose-Capillary: 316 mg/dL — ABNORMAL HIGH (ref 70–99)

## 2012-08-06 MED ORDER — FUROSEMIDE 20 MG PO TABS: 20.0000 mg | ORAL_TABLET | Freq: Every day | ORAL | Status: DC | PRN

## 2012-08-06 MED ORDER — HYDROCHLOROTHIAZIDE 12.5 MG PO CAPS: ORAL_CAPSULE | ORAL | Status: DC

## 2012-08-06 MED ORDER — GLUCOSE BLOOD VI STRP: ORAL_STRIP | Status: DC

## 2012-08-06 MED ORDER — LISDEXAMFETAMINE DIMESYLATE 70 MG PO CAPS: 70.0000 mg | ORAL_CAPSULE | ORAL | Status: DC

## 2012-08-06 MED ORDER — INSULIN DETEMIR 100 UNIT/ML FLEXPEN: PEN_INJECTOR | SUBCUTANEOUS | Status: DC

## 2012-08-06 NOTE — Patient Instructions (Addendum)
Recheck BP in one month.

## 2012-08-06 NOTE — Progress Notes (Addendum)
  Subjective:    Patient ID: Melissa [name], female    DOB: 1971-01-19, 42 y.o.   MRN: [id number]  Diabetes She presents for her follow-up diabetic visit. She has type 2 diabetes mellitus. No MedicAlert identification noted. Her disease course has been fluctuating. Hypoglycemia symptoms include dizziness, headaches, hunger, mood changes, pallor and sweats. Associated symptoms include fatigue, foot ulcerations and weakness. There are no hypoglycemic complications. Symptoms are improving. Diabetic complications include nephropathy and peripheral neuropathy. Risk factors for coronary artery disease include diabetes mellitus, stress and sedentary lifestyle. Current diabetic treatment includes insulin injections and diet. She is compliant with treatment all of the time. Insulin dose schedule: Patient gives Levimer 46 units at breakfast and at night. Her weight is stable. She is following a generally healthy diet. Meal planning includes avoidance of concentrated sweets. She has not had a previous visit with a dietician. She never participates in exercise. Home blood sugar record trend: Patient reports sugars are always around 100 when she checks or lower at some points where she starts to feel hypoglycemic symptoms.  An ACE inhibitor/angiotensin II receptor blocker is not being taken. She does not see a podiatrist.Eye exam is current.   Pt would also like refill on vyvanse for ADHD. Denies any worsening insomnia other than what she currently experiences, CP, palpitations, anorexia. Feels like medication helps day to day functionality.         Review of Systems  Constitutional: Positive for fatigue.  Skin: Positive for pallor.  Neurological: Positive for dizziness, weakness and headaches.       Objective:   Physical Exam  Constitutional: She is oriented to person, place, and time. She appears well-developed and well-nourished.  HENT:  Head: Normocephalic and atraumatic.  Cardiovascular: Normal  rate, regular rhythm and normal heart sounds.   Pulmonary/Chest: Effort normal and breath sounds normal.  Neurological: She is alert and oriented to person, place, and time.  Skin:  Left cam boot and covered where ulcers and wound are located.   Psychiatric: She has a normal mood and affect. Her behavior is normal.          Assessment & Plan:  Diabetes Mellitus- A1C was 8.1. Pt insists that her sugars have been too low several  Times during the day when she has checked and results are increased because of episode in hospital with vancomycin when sugar was not controlled. I will not make any changes today but told pt that is not goal so in the next 3 months we need to make changes to get to goal. Encouraged patient to start a sugar log. I need multiple readings from in the morning to before meals to see what time of day sugars are going too high. Refilled levimer 46 units twice a day. Discussion about ACE will be had at next visit due to BP and DM>   Elevated blood pressure reading- Pt does not want to start anymore medication and reports this is due today to stress. I reminded pt that I could not continue to give vyvanse if BP was not controlled. Will recheck in1 month before next vyvanse rx.  ADHD- refilled vyvanse.
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
## 2012-08-09 DIAGNOSIS — F909 Attention-deficit hyperactivity disorder, unspecified type: Secondary | ICD-10-CM | POA: Insufficient documentation

## 2012-08-09 LAB — GLUCOSE, CAPILLARY
Glucose-Capillary: 95 mg/dL (ref 70–99)
Glucose-Capillary: 220 mg/dL — ABNORMAL HIGH (ref 70–99)

## 2012-08-09 NOTE — Addendum Note (Signed)
Addended by: Jomarie Longs on: 08/09/2012 07:49 AM   Modules accepted: Level of Service

## 2012-08-11 LAB — GLUCOSE, CAPILLARY: Glucose-Capillary: 302 mg/dL — ABNORMAL HIGH (ref 70–99)

## 2012-08-12 LAB — GLUCOSE, CAPILLARY: Glucose-Capillary: 168 mg/dL — ABNORMAL HIGH (ref 70–99)

## 2012-08-13 LAB — GLUCOSE, CAPILLARY
Glucose-Capillary: 198 mg/dL — ABNORMAL HIGH (ref 70–99)
Glucose-Capillary: 219 mg/dL — ABNORMAL HIGH (ref 70–99)

## 2012-08-17 LAB — GLUCOSE, CAPILLARY: Glucose-Capillary: 406 mg/dL — ABNORMAL HIGH (ref 70–99)

## 2012-08-18 LAB — GLUCOSE, CAPILLARY
Glucose-Capillary: 140 mg/dL — ABNORMAL HIGH (ref 70–99)
Glucose-Capillary: 173 mg/dL — ABNORMAL HIGH (ref 70–99)

## 2012-08-19 LAB — GLUCOSE, CAPILLARY
Glucose-Capillary: 287 mg/dL — ABNORMAL HIGH (ref 70–99)
Glucose-Capillary: 198 mg/dL — ABNORMAL HIGH (ref 70–99)

## 2012-08-20 LAB — GLUCOSE, CAPILLARY: Glucose-Capillary: 168 mg/dL — ABNORMAL HIGH (ref 70–99)

## 2012-08-24 ENCOUNTER — Encounter (HOSPITAL_BASED_OUTPATIENT_CLINIC_OR_DEPARTMENT_OTHER): Payer: BC Managed Care – PPO | Attending: General Surgery

## 2012-08-24 DIAGNOSIS — M869 Osteomyelitis, unspecified: Secondary | ICD-10-CM | POA: Insufficient documentation

## 2012-08-24 DIAGNOSIS — L97509 Non-pressure chronic ulcer of other part of unspecified foot with unspecified severity: Secondary | ICD-10-CM | POA: Insufficient documentation

## 2012-08-24 DIAGNOSIS — E1169 Type 2 diabetes mellitus with other specified complication: Secondary | ICD-10-CM | POA: Insufficient documentation

## 2012-08-26 LAB — GLUCOSE, CAPILLARY
Glucose-Capillary: 334 mg/dL — ABNORMAL HIGH (ref 70–99)
Glucose-Capillary: 213 mg/dL — ABNORMAL HIGH (ref 70–99)

## 2012-08-30 LAB — GLUCOSE, CAPILLARY
Glucose-Capillary: 189 mg/dL — ABNORMAL HIGH (ref 70–99)
Glucose-Capillary: 95 mg/dL (ref 70–99)
Glucose-Capillary: 200 mg/dL — ABNORMAL HIGH (ref 70–99)

## 2012-08-31 LAB — GLUCOSE, CAPILLARY
Glucose-Capillary: 169 mg/dL — ABNORMAL HIGH (ref 70–99)
Glucose-Capillary: 90 mg/dL (ref 70–99)

## 2012-09-01 LAB — GLUCOSE, CAPILLARY
Glucose-Capillary: 125 mg/dL — ABNORMAL HIGH (ref 70–99)
Glucose-Capillary: 345 mg/dL — ABNORMAL HIGH (ref 70–99)

## 2012-09-02 ENCOUNTER — Other Ambulatory Visit: Payer: Self-pay

## 2012-09-03 ENCOUNTER — Ambulatory Visit: Payer: Self-pay | Admitting: Physician Assistant

## 2012-09-06 LAB — GLUCOSE, CAPILLARY: Glucose-Capillary: 63 mg/dL — ABNORMAL LOW (ref 70–99)

## 2012-09-07 LAB — GLUCOSE, CAPILLARY
Glucose-Capillary: 221 mg/dL — ABNORMAL HIGH (ref 70–99)
Glucose-Capillary: 295 mg/dL — ABNORMAL HIGH (ref 70–99)

## 2012-09-08 ENCOUNTER — Other Ambulatory Visit: Payer: Self-pay | Admitting: *Deleted

## 2012-09-08 LAB — GLUCOSE, CAPILLARY: Glucose-Capillary: 272 mg/dL — ABNORMAL HIGH (ref 70–99)

## 2012-09-08 MED ORDER — LISDEXAMFETAMINE DIMESYLATE 70 MG PO CAPS: 70.0000 mg | ORAL_CAPSULE | ORAL | Status: DC

## 2012-09-09 LAB — GLUCOSE, CAPILLARY: Glucose-Capillary: 167 mg/dL — ABNORMAL HIGH (ref 70–99)

## 2012-09-10 LAB — GLUCOSE, CAPILLARY: Glucose-Capillary: 317 mg/dL — ABNORMAL HIGH (ref 70–99)

## 2012-09-13 LAB — GLUCOSE, CAPILLARY: Glucose-Capillary: 123 mg/dL — ABNORMAL HIGH (ref 70–99)

## 2012-09-15 LAB — GLUCOSE, CAPILLARY
Glucose-Capillary: 111 mg/dL — ABNORMAL HIGH (ref 70–99)
Glucose-Capillary: 221 mg/dL — ABNORMAL HIGH (ref 70–99)

## 2012-09-17 LAB — GLUCOSE, CAPILLARY
Glucose-Capillary: 201 mg/dL — ABNORMAL HIGH (ref 70–99)
Glucose-Capillary: 69 mg/dL — ABNORMAL LOW (ref 70–99)

## 2012-09-19 ENCOUNTER — Other Ambulatory Visit: Payer: Self-pay | Admitting: Family Medicine

## 2012-09-19 ENCOUNTER — Other Ambulatory Visit: Payer: Self-pay | Admitting: Physician Assistant

## 2012-09-20 LAB — GLUCOSE, CAPILLARY
Glucose-Capillary: 146 mg/dL — ABNORMAL HIGH (ref 70–99)
Glucose-Capillary: 102 mg/dL — ABNORMAL HIGH (ref 70–99)

## 2012-09-21 LAB — GLUCOSE, CAPILLARY: Glucose-Capillary: 334 mg/dL — ABNORMAL HIGH (ref 70–99)

## 2012-09-24 ENCOUNTER — Encounter (HOSPITAL_BASED_OUTPATIENT_CLINIC_OR_DEPARTMENT_OTHER): Payer: BC Managed Care – PPO | Attending: General Surgery

## 2012-09-24 ENCOUNTER — Telehealth: Payer: Self-pay | Admitting: *Deleted

## 2012-09-24 DIAGNOSIS — E1169 Type 2 diabetes mellitus with other specified complication: Secondary | ICD-10-CM | POA: Insufficient documentation

## 2012-09-24 DIAGNOSIS — E119 Type 2 diabetes mellitus without complications: Secondary | ICD-10-CM

## 2012-09-24 DIAGNOSIS — M869 Osteomyelitis, unspecified: Secondary | ICD-10-CM | POA: Insufficient documentation

## 2012-09-24 DIAGNOSIS — L97509 Non-pressure chronic ulcer of other part of unspecified foot with unspecified severity: Secondary | ICD-10-CM | POA: Insufficient documentation

## 2012-09-24 LAB — GLUCOSE, CAPILLARY: Glucose-Capillary: 267 mg/dL — ABNORMAL HIGH (ref 70–99)

## 2012-09-24 NOTE — Telephone Encounter (Signed)
Pt's husband called and asked if Dr.Metheney would give a refill for her to have the Glucagon x3. She has not had this since 2013 Forwarded to Dr. Linford Arnold for advice.Loralee Pacas [place]

## 2012-09-27 LAB — GLUCOSE, CAPILLARY
Glucose-Capillary: 481 mg/dL — ABNORMAL HIGH (ref 70–99)
Glucose-Capillary: 324 mg/dL — ABNORMAL HIGH (ref 70–99)

## 2012-09-27 MED ORDER — GLUCAGON (RDNA) 1 MG IJ KIT: PACK | INTRAMUSCULAR | Status: DC

## 2012-09-27 NOTE — Telephone Encounter (Signed)
OK to fill

## 2012-09-27 NOTE — Telephone Encounter (Signed)
Called and informed pt's husband.Loralee Pacas [place]

## 2012-09-28 ENCOUNTER — Encounter: Payer: Self-pay | Admitting: Internal Medicine

## 2012-09-29 ENCOUNTER — Other Ambulatory Visit: Payer: Self-pay | Admitting: Family Medicine

## 2012-10-01 ENCOUNTER — Ambulatory Visit (INDEPENDENT_AMBULATORY_CARE_PROVIDER_SITE_OTHER): Payer: BC Managed Care – PPO | Admitting: Physician Assistant

## 2012-10-01 ENCOUNTER — Encounter: Payer: Self-pay | Admitting: Physician Assistant

## 2012-10-01 VITALS — BP 123/81 | HR 109 | Wt 160.0 lb

## 2012-10-01 DIAGNOSIS — M79672 Pain in left foot: Secondary | ICD-10-CM

## 2012-10-01 DIAGNOSIS — R600 Localized edema: Secondary | ICD-10-CM

## 2012-10-01 DIAGNOSIS — I10 Essential (primary) hypertension: Secondary | ICD-10-CM

## 2012-10-01 DIAGNOSIS — E119 Type 2 diabetes mellitus without complications: Secondary | ICD-10-CM

## 2012-10-01 DIAGNOSIS — F909 Attention-deficit hyperactivity disorder, unspecified type: Secondary | ICD-10-CM

## 2012-10-01 DIAGNOSIS — R609 Edema, unspecified: Secondary | ICD-10-CM

## 2012-10-01 DIAGNOSIS — M79609 Pain in unspecified limb: Secondary | ICD-10-CM

## 2012-10-01 MED ORDER — HYDROCODONE-ACETAMINOPHEN 5-325 MG PO TABS: 1.0000 | ORAL_TABLET | Freq: Four times a day (QID) | ORAL | Status: DC | PRN

## 2012-10-01 MED ORDER — LISDEXAMFETAMINE DIMESYLATE 70 MG PO CAPS: 70.0000 mg | ORAL_CAPSULE | ORAL | Status: DC

## 2012-10-01 MED ORDER — INSULIN PEN NEEDLE 31G X 5 MM MISC: Status: DC

## 2012-10-01 MED ORDER — FUROSEMIDE 20 MG PO TABS: 20.0000 mg | ORAL_TABLET | Freq: Every day | ORAL | Status: DC | PRN

## 2012-10-01 NOTE — Progress Notes (Signed)
  Subjective:    Patient ID: Melissa [name] [place], female    DOB: 06/10/1970, 42 y.o.   MRN: [id number]  HPI Patient presents to the clinic to get med refills.  ADHD-patient is well controlled on Diovan. She denies any palpitations or concerns with medication. She does have chronic insomnia even without events. She reports her quality of life is much better when she's on a stimulant. She's not currently working but has tried to stop stimulants and decreases her quality of life significantly.  Patient was also like a refill on her Lasix for lower leg edema. She only takes as needed. She has occasional swelling along the site where her leg was infected.  Patient would also like refill on Vicodin for left foot pain. She has a surgical consult in one and half weeks for potential surgery. She will see a doctor at [facility]ist Dr. Sid Falcon. Her pain is localized along the 5th metatarsal.   Patient would like to switch to insulin needles to not ultra UltraFine. She reports that needles are flimsy and tend to bend easily. She would like to switch to only ultra fine. Patient reports that her sugars have decreased since going off vancomycin 6 weeks ago. She continues to be on doxycycline daily. Not toxic for her three-month followup at this point.    Review of Systems     Objective:   Physical Exam  Constitutional: She is oriented to person, place, and time. She appears well-developed and well-nourished.  HENT:  Head: Normocephalic and atraumatic.  Cardiovascular: Normal rate, regular rhythm and normal heart sounds.   Pulmonary/Chest: Effort normal and breath sounds normal.  Musculoskeletal:  Tenderness over the left lateral foot along the fifth metatarsal bone. There is a visual long and patient complains of tenderness. Strength is decreased to 3/5.   Neurological: She is alert and oriented to person, place, and time.  Skin: Skin is warm and dry.  Negative for lower leg edema today. Area of  previous open wound is well healed. Scar is very erythematous and widespread.  Psychiatric: She has a normal mood and affect. Her behavior is normal.          Assessment & Plan:  ADHD- refilled last for 3 months.  Lower leg edema-refilled Lasix to use as needed for swelling. Patient is due to have complete lab workup by Dr. Jimmey Ralph next week. Will fax of labs.  Left foot pain-per patient she has a foot consult and we can have. I gave her enough Vicodin to get to that appointment.  Diabetes-Cindy prescription for insulin needles. I'm not exactly sure if there is anything other than UltraFine but sent to pharmacy for clarification. Patient is not due for A1c at this time.

## 2012-10-06 ENCOUNTER — Telehealth: Payer: Self-pay | Admitting: Family Medicine

## 2012-10-06 NOTE — Telephone Encounter (Signed)
Pt's husband called and states that Melissa [name] is having to check her BS more often due to her being on antibiotic and needs Test Strips called in for 2 boxes per script. Thanks

## 2012-10-08 ENCOUNTER — Other Ambulatory Visit: Payer: Self-pay | Admitting: *Deleted

## 2012-10-08 MED ORDER — GLUCOSE BLOOD VI STRP: ORAL_STRIP | Status: DC

## 2012-10-08 NOTE — Telephone Encounter (Signed)
Refill sent.

## 2012-10-08 NOTE — Telephone Encounter (Signed)
Ok for 11 refills on test strips.

## 2012-10-12 ENCOUNTER — Other Ambulatory Visit: Payer: Self-pay | Admitting: Family Medicine

## 2012-10-12 DIAGNOSIS — Z1231 Encounter for screening mammogram for malignant neoplasm of breast: Secondary | ICD-10-CM

## 2012-10-14 ENCOUNTER — Ambulatory Visit: Payer: BC Managed Care – PPO

## 2012-10-14 DIAGNOSIS — Z1231 Encounter for screening mammogram for malignant neoplasm of breast: Secondary | ICD-10-CM

## 2012-10-16 ENCOUNTER — Other Ambulatory Visit: Payer: Self-pay | Admitting: Physician Assistant

## 2012-10-28 ENCOUNTER — Other Ambulatory Visit: Payer: Self-pay | Admitting: Family Medicine

## 2012-10-31 ENCOUNTER — Other Ambulatory Visit: Payer: Self-pay | Admitting: Physician Assistant

## 2012-11-16 NOTE — Telephone Encounter (Signed)
error 

## 2012-11-18 ENCOUNTER — Encounter: Payer: Self-pay | Admitting: Family Medicine

## 2012-11-18 ENCOUNTER — Ambulatory Visit (INDEPENDENT_AMBULATORY_CARE_PROVIDER_SITE_OTHER): Payer: BC Managed Care – PPO | Admitting: Family Medicine

## 2012-11-18 VITALS — BP 132/82 | HR 96 | Wt 163.0 lb

## 2012-11-18 DIAGNOSIS — E119 Type 2 diabetes mellitus without complications: Secondary | ICD-10-CM

## 2012-11-18 DIAGNOSIS — T799XXS Unspecified early complication of trauma, sequela: Secondary | ICD-10-CM

## 2012-11-18 DIAGNOSIS — F909 Attention-deficit hyperactivity disorder, unspecified type: Secondary | ICD-10-CM

## 2012-11-18 DIAGNOSIS — T798XXS Other early complications of trauma, sequela: Secondary | ICD-10-CM

## 2012-11-18 MED ORDER — LISDEXAMFETAMINE DIMESYLATE 70 MG PO CAPS: 70.0000 mg | ORAL_CAPSULE | ORAL | Status: DC
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
## 2012-11-19 ENCOUNTER — Telehealth: Payer: Self-pay | Admitting: Family Medicine

## 2012-11-19 NOTE — Telephone Encounter (Signed)
Called and informed pt to refax her paperwork to Dr. Jim Desanctis office. She voiced understanding and agreed.Melissa [name] [place]

## 2012-11-19 NOTE — Telephone Encounter (Signed)
Please call pt and let her know we called Dr. Albin Fischer office.  Please refax your paperwork to their office. He is in surgery today.

## 2012-12-02 ENCOUNTER — Other Ambulatory Visit: Payer: Self-pay | Admitting: Physician Assistant

## 2012-12-28 ENCOUNTER — Other Ambulatory Visit: Payer: Self-pay | Admitting: Physician Assistant

## 2012-12-28 ENCOUNTER — Other Ambulatory Visit: Payer: Self-pay | Admitting: Family Medicine

## 2012-12-28 ENCOUNTER — Other Ambulatory Visit: Payer: Self-pay | Admitting: *Deleted

## 2012-12-28 MED ORDER — ALPRAZOLAM 1 MG PO TABS: ORAL_TABLET | ORAL | Status: DC

## 2012-12-31 ENCOUNTER — Other Ambulatory Visit: Payer: Self-pay | Admitting: Physician Assistant

## 2013-01-03 ENCOUNTER — Ambulatory Visit: Payer: Self-pay | Admitting: Physician Assistant

## 2013-01-03 DIAGNOSIS — Z0289 Encounter for other administrative examinations: Secondary | ICD-10-CM

## 2013-01-05 ENCOUNTER — Other Ambulatory Visit: Payer: Self-pay | Admitting: Family Medicine

## 2013-01-19 ENCOUNTER — Ambulatory Visit: Payer: Self-pay | Admitting: Physician Assistant

## 2013-01-28 ENCOUNTER — Other Ambulatory Visit: Payer: Self-pay | Admitting: *Deleted

## 2013-01-28 ENCOUNTER — Other Ambulatory Visit: Payer: Self-pay | Admitting: Family Medicine

## 2013-01-28 ENCOUNTER — Ambulatory Visit: Payer: Self-pay | Admitting: Physician Assistant

## 2013-01-31 ENCOUNTER — Ambulatory Visit (INDEPENDENT_AMBULATORY_CARE_PROVIDER_SITE_OTHER): Payer: BC Managed Care – PPO | Admitting: Physician Assistant

## 2013-01-31 DIAGNOSIS — R296 Repeated falls: Secondary | ICD-10-CM

## 2013-01-31 DIAGNOSIS — E1142 Type 2 diabetes mellitus with diabetic polyneuropathy: Secondary | ICD-10-CM

## 2013-01-31 DIAGNOSIS — Z9181 History of falling: Secondary | ICD-10-CM

## 2013-01-31 DIAGNOSIS — E1140 Type 2 diabetes mellitus with diabetic neuropathy, unspecified: Secondary | ICD-10-CM

## 2013-01-31 DIAGNOSIS — E1149 Type 2 diabetes mellitus with other diabetic neurological complication: Secondary | ICD-10-CM

## 2013-01-31 DIAGNOSIS — Z0271 Encounter for disability determination: Secondary | ICD-10-CM

## 2013-01-31 DIAGNOSIS — F909 Attention-deficit hyperactivity disorder, unspecified type: Secondary | ICD-10-CM

## 2013-01-31 MED ORDER — LISDEXAMFETAMINE DIMESYLATE 70 MG PO CAPS: 70.0000 mg | ORAL_CAPSULE | ORAL | Status: DC

## 2013-01-31 NOTE — Progress Notes (Signed)
   Subjective:    Patient ID: Melissa [name] [place], female    DOB: 03-05-1970, 42 y.o.   MRN: [id number]  HPI Patient presents to the clinic wanting to discuss me filling out paperwork for disability. She comes in 2 years because she was denied disability. She reports that whoever file for disability only try to get disability due to her diabetes. She reports that is not the main reason why she cannot work. Patient goes on to explain how she has minimal feeling in her left foot. Patient reports she has on multiple times in the last month. She reports that her orthopedist specialist, Dr. 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 She has ongoing carpal tunnel to which she needs surgery but needs to be stable for surgery. She did recently have a complete physical with her OB/GYN and have fasting labs drawn. She will get those.    Adult ADHD-patient reports to be well-controlled on Vyvanse and would like prescription. Denies any insomnia, palpitations, anorexia. She is currently not working but she reports that this helps her complete her daily tasks at home with all of her doctors visits.   Review of Systems     Objective:   Physical Exam        Assessment & Plan:  Patient declined vitals initially. I did check her blood pressure 2 refilled 5 minutes. Was 136/78.  Vital ADHD-refilled Vyvanse for one month.  Spent 45 minutes with patient greater than 50% of visit spent counseling and discussing filling out paperwork for disability. Patient signed releases for all of her doctors that manage her chronic conditions. I stated that once I got these records would be willing to fill out paperwork and put it information from these doctors. I discussed with her how was not the best candidate for this because I did not know how impaired she was. I will order a functional assessment with PT. I would like for them to see her and what she can do.  Patient  was told she needs to reschedule appointment for her diabetes.

## 2013-01-31 NOTE — Patient Instructions (Addendum)
Send you to PT to have functional Assessment.   Referral psychiatry.

## 2013-02-01 ENCOUNTER — Telehealth: Payer: Self-pay | Admitting: *Deleted

## 2013-02-01 NOTE — Telephone Encounter (Signed)
Pt left message asking if you would send in refills for her regular insulin pen & retin-A cream.  She also asked for miconazole for yeast since she has been started on an abx.

## 2013-02-02 NOTE — Telephone Encounter (Signed)
Who gave abx? And what are you on it for?   Ok for insulin and retin-A rx for 3 months.

## 2013-02-03 ENCOUNTER — Other Ambulatory Visit: Payer: Self-pay | Admitting: Physician Assistant

## 2013-02-03 MED ORDER — MICONAZOLE NITRATE 200 MG VA SUPP: 200.0000 mg | Freq: Every day | VAGINAL | Status: DC

## 2013-02-03 MED ORDER — INSULIN ASPART 100 UNIT/ML FLEXPEN: PEN_INJECTOR | SUBCUTANEOUS | Status: DC

## 2013-02-03 MED ORDER — TRETINOIN MICROSPHERE 0.04 % EX GEL: Freq: Every day | CUTANEOUS | Status: DC

## 2013-02-03 NOTE — Telephone Encounter (Signed)
Pt was put on clindamycin for a root canal that she had the day after Thanksgiving.  She is asking for the novolog pen for her sliding scale.  I don't know what the dispense quantity & sig need to be for that or the retin-A.

## 2013-02-03 NOTE — Telephone Encounter (Signed)
Sent antifungal, novolog pen and retin A to pharmacy.

## 2013-02-05 ENCOUNTER — Other Ambulatory Visit: Payer: Self-pay | Admitting: Physician Assistant

## 2013-02-08 NOTE — Telephone Encounter (Signed)
PA required for the retin-A cream for going over quantity limitations.  Paper placed in your basket.

## 2013-02-09 ENCOUNTER — Other Ambulatory Visit: Payer: Self-pay | Admitting: Physician Assistant

## 2013-02-09 NOTE — Telephone Encounter (Signed)
Spoke with pt & she prefers to pay outright for the retin-A.  Notified pharm.

## 2013-02-09 NOTE — Telephone Encounter (Signed)
Call pt: let her know retin A denied. What else has she tried? We could send something else over.

## 2013-02-18 ENCOUNTER — Other Ambulatory Visit: Payer: Self-pay | Admitting: Physician Assistant

## 2013-03-02 ENCOUNTER — Encounter: Payer: Self-pay | Admitting: Physician Assistant

## 2013-03-02 ENCOUNTER — Other Ambulatory Visit: Payer: Self-pay | Admitting: Physician Assistant

## 2013-03-02 ENCOUNTER — Ambulatory Visit (INDEPENDENT_AMBULATORY_CARE_PROVIDER_SITE_OTHER): Payer: BC Managed Care – PPO | Admitting: Physician Assistant

## 2013-03-02 VITALS — BP 135/85 | HR 96 | Wt 158.0 lb

## 2013-03-02 DIAGNOSIS — F411 Generalized anxiety disorder: Secondary | ICD-10-CM

## 2013-03-02 DIAGNOSIS — F329 Major depressive disorder, single episode, unspecified: Secondary | ICD-10-CM

## 2013-03-02 DIAGNOSIS — F3289 Other specified depressive episodes: Secondary | ICD-10-CM

## 2013-03-02 DIAGNOSIS — G4700 Insomnia, unspecified: Secondary | ICD-10-CM

## 2013-03-02 DIAGNOSIS — E119 Type 2 diabetes mellitus without complications: Secondary | ICD-10-CM

## 2013-03-02 LAB — POCT GLYCOSYLATED HEMOGLOBIN (HGB A1C): Hemoglobin A1C: 7.7

## 2013-03-02 MED ORDER — LISDEXAMFETAMINE DIMESYLATE 70 MG PO CAPS: 70.0000 mg | ORAL_CAPSULE | ORAL | Status: DC

## 2013-03-02 MED ORDER — ZOLPIDEM TARTRATE ER 12.5 MG PO TBCR: 12.5000 mg | EXTENDED_RELEASE_TABLET | Freq: Once | ORAL | Status: DC

## 2013-03-02 MED ORDER — AMBULATORY NON FORMULARY MEDICATION: Status: DC

## 2013-03-02 MED ORDER — ALPRAZOLAM 1 MG PO TABS: ORAL_TABLET | ORAL | Status: DC

## 2013-03-02 NOTE — Patient Instructions (Signed)
Invokana, faraxgia consider if GFR above 45.

## 2013-03-03 ENCOUNTER — Other Ambulatory Visit: Payer: Self-pay | Admitting: Physician Assistant

## 2013-03-03 ENCOUNTER — Ambulatory Visit: Payer: BC Managed Care – PPO | Attending: Physician Assistant | Admitting: Physical Therapy

## 2013-03-04 NOTE — Progress Notes (Signed)
   Subjective:    Patient ID: Melissa [name], female    DOB: Jul 21, 1970, 43 y.o.   MRN: [id number]  HPI Pt presents to the clinic for medication refills.   DM- per pt sugars vary from wide range. Most fastings are 120-150. During the day at meal time insulin varies to 180-220. Not eating a great diet. Very depressed and stressed. No hypoglyemia. Ongoing diabetic neuropathy. On daily insulin therapy.   Anxiety/Depression- on xanax as needed, lamictal and wellbutrin. Need evaluation for disability.   Insomnia- taking ambien at night with xanax and still not able to sleep.   ADHD- controlled on vyvanse.    Review of Systems     Objective:   Physical Exam  Constitutional: She is oriented to person, place, and time. She appears well-developed and well-nourished.  HENT:  Head: Normocephalic and atraumatic.  Cardiovascular: Normal rate, regular rhythm and normal heart sounds.   Pulmonary/Chest: Effort normal and breath sounds normal.  Neurological: She is alert and oriented to person, place, and time.  Psychiatric: She has a normal mood and affect. Her behavior is normal.          Assessment & Plan:  DM- . Lab Results  Component Value Date   HGBA1C 7.7 03/02/2013  Pt's a1c is still not controlled. Lantus 45 units BiD. Sliding scale insulin at meals. Metformin.  Pt has been on prednisone a lot of 3 months. She admits to being stressed and eating more. Per pt cmp done at [facility](ob/gyn) will get copies so not drawn again. Follow up in 3 months. Continue checking glucose fasting. What ever meal check glucose and above 180 to increase units by 1 at previous meal until all pre-meal BS are below 180.   ADHD- refilled vyvanse.  Anxiety/depression/insomnia- refilled xanax and ambien. Will refer to pysch for evaluation and management since current therapy is not working.

## 2013-03-07 ENCOUNTER — Telehealth: Payer: Self-pay | Admitting: *Deleted

## 2013-03-07 NOTE — Telephone Encounter (Signed)
Pt left message stating that her ins has changed & her vyvanse has tripled to almost $90. She states that adderall is on her formulary & wants to know if you'd be comfortable switching her.  She also wanted you to know that they have changed her disability assessment until 1/26.

## 2013-03-08 ENCOUNTER — Other Ambulatory Visit: Payer: Self-pay | Admitting: Physician Assistant

## 2013-03-08 ENCOUNTER — Telehealth: Payer: Self-pay | Admitting: *Deleted

## 2013-03-08 DIAGNOSIS — F411 Generalized anxiety disorder: Secondary | ICD-10-CM

## 2013-03-08 DIAGNOSIS — F909 Attention-deficit hyperactivity disorder, unspecified type: Secondary | ICD-10-CM

## 2013-03-08 DIAGNOSIS — F3289 Other specified depressive episodes: Secondary | ICD-10-CM

## 2013-03-08 DIAGNOSIS — F329 Major depressive disorder, single episode, unspecified: Secondary | ICD-10-CM

## 2013-03-08 NOTE — Telephone Encounter (Signed)
Pretty sure that is what I did but sent a new referral.

## 2013-03-08 NOTE — Telephone Encounter (Signed)
Ok for one month of adderall XL 30mg  no refills.

## 2013-03-08 NOTE — Telephone Encounter (Signed)
Pt is asking for psychiatry referral not psychology.  She needs one that can rx meds.

## 2013-03-09 MED ORDER — AMPHETAMINE-DEXTROAMPHET ER 30 MG PO CP24: 30.0000 mg | ORAL_CAPSULE | Freq: Every day | ORAL | Status: DC

## 2013-03-09 NOTE — Telephone Encounter (Signed)
rx printed

## 2013-03-20 ENCOUNTER — Telehealth: Payer: Self-pay | Admitting: *Deleted

## 2013-03-20 ENCOUNTER — Emergency Department
Admission: EM | Admit: 2013-03-20 | Discharge: 2013-03-20 | Disposition: A | Discharge status: Home / Self Care | Payer: BC Managed Care – PPO | Source: Home / Self Care

## 2013-03-20 ENCOUNTER — Encounter: Payer: Self-pay | Admitting: Emergency Medicine

## 2013-03-20 DIAGNOSIS — J069 Acute upper respiratory infection, unspecified: Secondary | ICD-10-CM

## 2013-03-20 LAB — POCT INFLUENZA A/B
Influenza A, POC: NEGATIVE
Influenza B, POC: NEGATIVE

## 2013-03-20 MED ORDER — OSELTAMIVIR PHOSPHATE 75 MG PO CAPS: 75.0000 mg | ORAL_CAPSULE | Freq: Two times a day (BID) | ORAL | Status: DC

## 2013-03-20 MED ORDER — AZITHROMYCIN 250 MG PO TABS: ORAL_TABLET | ORAL | Status: DC

## 2013-03-20 MED ORDER — ALBUTEROL SULFATE HFA 108 (90 BASE) MCG/ACT IN AERS: 2.0000 | INHALATION_SPRAY | RESPIRATORY_TRACT | Status: DC | PRN

## 2013-03-20 MED ORDER — ONDANSETRON 4 MG PO TBDP: 4.0000 mg | ORAL_TABLET | Freq: Three times a day (TID) | ORAL | Status: DC | PRN
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
## 2013-03-20 NOTE — ED Notes (Signed)
Patient c/o fever, chills, otalgia, rhinitis and cough since yesterday.

## 2013-03-21 ENCOUNTER — Ambulatory Visit: Payer: BC Managed Care – PPO

## 2013-03-25 ENCOUNTER — Other Ambulatory Visit: Payer: Self-pay | Admitting: Physician Assistant

## 2013-03-25 ENCOUNTER — Encounter: Payer: Self-pay | Admitting: Physician Assistant

## 2013-03-25 DIAGNOSIS — F329 Major depressive disorder, single episode, unspecified: Secondary | ICD-10-CM

## 2013-03-25 DIAGNOSIS — F3289 Other specified depressive episodes: Secondary | ICD-10-CM

## 2013-03-25 DIAGNOSIS — F411 Generalized anxiety disorder: Secondary | ICD-10-CM

## 2013-03-25 DIAGNOSIS — G4700 Insomnia, unspecified: Secondary | ICD-10-CM

## 2013-03-25 DIAGNOSIS — G629 Polyneuropathy, unspecified: Secondary | ICD-10-CM | POA: Insufficient documentation

## 2013-03-25 DIAGNOSIS — F909 Attention-deficit hyperactivity disorder, unspecified type: Secondary | ICD-10-CM

## 2013-03-31 ENCOUNTER — Other Ambulatory Visit: Payer: Self-pay | Admitting: Family Medicine

## 2013-04-01 ENCOUNTER — Other Ambulatory Visit: Payer: Self-pay | Admitting: Physician Assistant

## 2013-04-06 ENCOUNTER — Other Ambulatory Visit: Payer: Self-pay | Admitting: Physician Assistant

## 2013-04-06 MED ORDER — AMPHETAMINE-DEXTROAMPHET ER 30 MG PO CP24: 30.0000 mg | ORAL_CAPSULE | Freq: Every day | ORAL | Status: DC

## 2013-04-06 MED ORDER — INSULIN LISPRO 100 UNIT/ML (KWIKPEN): PEN_INJECTOR | SUBCUTANEOUS | Status: DC

## 2013-04-06 NOTE — Progress Notes (Signed)
Med sent to pharm 

## 2013-04-06 NOTE — Progress Notes (Signed)
Ok for diflucan 150mg  once. No refills.

## 2013-04-06 NOTE — Progress Notes (Signed)
Call pt: insurance sent letter need humalog instead of novolog.

## 2013-04-07 MED ORDER — FLUCONAZOLE 150 MG PO TABS: 150.0000 mg | ORAL_TABLET | Freq: Once | ORAL | Status: DC

## 2013-04-19 ENCOUNTER — Ambulatory Visit: Payer: BC Managed Care – PPO

## 2013-04-28 ENCOUNTER — Other Ambulatory Visit: Payer: Self-pay | Admitting: *Deleted

## 2013-04-28 DIAGNOSIS — E119 Type 2 diabetes mellitus without complications: Secondary | ICD-10-CM

## 2013-04-28 MED ORDER — INSULIN DETEMIR 100 UNIT/ML FLEXPEN: PEN_INJECTOR | SUBCUTANEOUS | Status: DC

## 2013-04-28 MED ORDER — GLUCAGON (RDNA) 1 MG IJ KIT: PACK | INTRAMUSCULAR | Status: DC

## 2013-04-28 MED ORDER — AMPHETAMINE-DEXTROAMPHET ER 30 MG PO CP24: 30.0000 mg | ORAL_CAPSULE | Freq: Every day | ORAL | Status: DC

## 2013-04-28 MED ORDER — HYDROCHLOROTHIAZIDE 12.5 MG PO CAPS: 12.5000 mg | ORAL_CAPSULE | Freq: Every day | ORAL | Status: DC

## 2013-04-28 MED ORDER — FUROSEMIDE 20 MG PO TABS: ORAL_TABLET | ORAL | Status: DC

## 2013-04-28 MED ORDER — BUPROPION HCL ER (XL) 150 MG PO TB24: ORAL_TABLET | ORAL | Status: DC

## 2013-04-28 MED ORDER — AMBULATORY NON FORMULARY MEDICATION: Status: DC

## 2013-04-28 MED ORDER — INSULIN PEN NEEDLE 31G X 5 MM MISC: Status: DC

## 2013-04-28 MED ORDER — ALPRAZOLAM 1 MG PO TABS: ORAL_TABLET | ORAL | Status: DC

## 2013-04-28 MED ORDER — LAMOTRIGINE 150 MG PO TABS: 150.0000 mg | ORAL_TABLET | Freq: Every day | ORAL | Status: DC

## 2013-04-28 MED ORDER — ZOLPIDEM TARTRATE ER 12.5 MG PO TBCR: EXTENDED_RELEASE_TABLET | ORAL | Status: DC

## 2013-04-29 ENCOUNTER — Other Ambulatory Visit: Payer: Self-pay | Admitting: Physician Assistant

## 2013-04-29 MED ORDER — TRETINOIN 0.1 % EX CREA: TOPICAL_CREAM | Freq: Every day | CUTANEOUS | Status: DC

## 2013-04-29 MED ORDER — AZELASTINE HCL 0.1 % NA SOLN: 2.0000 | Freq: Two times a day (BID) | NASAL | Status: DC

## 2013-05-04 ENCOUNTER — Ambulatory Visit: Payer: BC Managed Care – PPO | Attending: Physician Assistant

## 2013-05-04 DIAGNOSIS — E1142 Type 2 diabetes mellitus with diabetic polyneuropathy: Secondary | ICD-10-CM | POA: Insufficient documentation

## 2013-05-04 DIAGNOSIS — Z5189 Encounter for other specified aftercare: Secondary | ICD-10-CM | POA: Insufficient documentation

## 2013-05-04 DIAGNOSIS — E1149 Type 2 diabetes mellitus with other diabetic neurological complication: Secondary | ICD-10-CM | POA: Insufficient documentation

## 2013-05-07 ENCOUNTER — Emergency Department
Admission: EM | Admit: 2013-05-07 | Discharge: 2013-05-07 | Disposition: A | Discharge status: Home / Self Care | Payer: BC Managed Care – PPO | Source: Home / Self Care | Attending: Family Medicine | Admitting: Family Medicine

## 2013-05-07 ENCOUNTER — Encounter: Payer: Self-pay | Admitting: Emergency Medicine

## 2013-05-07 DIAGNOSIS — S39012A Strain of muscle, fascia and tendon of lower back, initial encounter: Secondary | ICD-10-CM

## 2013-05-07 DIAGNOSIS — S339XXA Sprain of unspecified parts of lumbar spine and pelvis, initial encounter: Secondary | ICD-10-CM

## 2013-05-07 DIAGNOSIS — S335XXA Sprain of ligaments of lumbar spine, initial encounter: Secondary | ICD-10-CM

## 2013-05-07 MED ORDER — CYCLOBENZAPRINE HCL 10 MG PO TABS: 10.0000 mg | ORAL_TABLET | Freq: Three times a day (TID) | ORAL | Status: DC | PRN

## 2013-05-07 MED ORDER — KETOROLAC TROMETHAMINE 30 MG/ML IJ SOLN
30.0000 mg | Freq: Once | INTRAMUSCULAR | Status: AC
  Administered 2013-05-07: 30 mg via INTRAMUSCULAR

## 2013-05-07 MED ORDER — MELOXICAM 15 MG PO TABS
15.0000 mg | ORAL_TABLET | Freq: Every day | ORAL | Status: DC
Start: 2013-05-07 — End: 2013-09-13
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
## 2013-05-07 NOTE — ED Notes (Signed)
Melissa [name] reports tripping in the [employer] parking lot and pulled and muscle in lower back/left hip. She states the pain is a 7/10 sharp pain worse when standing up. She has taken Motrin and Tylenol with little relief.

## 2013-05-13 ENCOUNTER — Telehealth: Payer: Self-pay | Admitting: *Deleted

## 2013-05-13 NOTE — Telephone Encounter (Signed)
Ok to switch 

## 2013-05-13 NOTE — Telephone Encounter (Signed)
Pt left message yesterday afternoon stating that the adderall is wearing off early in the day & would like to go back on Vyvanse.

## 2013-05-16 NOTE — Telephone Encounter (Signed)
LMOM for pt to call back & verify if she wants to pick it up or if she wants 90 day supply sent to mail order.

## 2013-05-18 ENCOUNTER — Other Ambulatory Visit: Payer: Self-pay | Admitting: Physician Assistant

## 2013-05-18 NOTE — Telephone Encounter (Signed)
Pt wants a 90 days supply of the vyvanse.  Do I need to get another provider to sign? Also, she wants to know if she needs to have another assessment because her appt was at 7am & she had started amitiptyline a few days before & had just went to sleep around 2am that morning of. If she does need another assessment, she would like an afternoon appt.

## 2013-05-20 MED ORDER — LISDEXAMFETAMINE DIMESYLATE 70 MG PO CAPS: 70.0000 mg | ORAL_CAPSULE | ORAL | Status: DC

## 2013-05-20 NOTE — Telephone Encounter (Signed)
Pt is referring to her FCE. vyvanse printed.

## 2013-05-20 NOTE — Telephone Encounter (Signed)
Can we please look and see what "assement she is talking about" I think with Dr. Dellia CloudGutterman for ADD clarification. It has also been 3 months for follow up. Need to document BP and HR before vyvanse. Can give one month to give time to come in.

## 2013-05-25 ENCOUNTER — Other Ambulatory Visit: Payer: Self-pay | Admitting: Physician Assistant

## 2013-06-01 ENCOUNTER — Ambulatory Visit: Payer: Self-pay | Admitting: Physician Assistant

## 2013-06-06 ENCOUNTER — Ambulatory Visit: Payer: Self-pay | Admitting: Physician Assistant

## 2013-06-13 ENCOUNTER — Ambulatory Visit (INDEPENDENT_AMBULATORY_CARE_PROVIDER_SITE_OTHER): Payer: BC Managed Care – PPO | Admitting: Physician Assistant

## 2013-06-13 ENCOUNTER — Encounter: Payer: Self-pay | Admitting: Physician Assistant

## 2013-06-13 VITALS — BP 121/77 | HR 104 | Ht 64.0 in | Wt 158.0 lb

## 2013-06-13 DIAGNOSIS — L309 Dermatitis, unspecified: Secondary | ICD-10-CM

## 2013-06-13 DIAGNOSIS — E119 Type 2 diabetes mellitus without complications: Secondary | ICD-10-CM

## 2013-06-13 DIAGNOSIS — F909 Attention-deficit hyperactivity disorder, unspecified type: Secondary | ICD-10-CM

## 2013-06-13 DIAGNOSIS — L259 Unspecified contact dermatitis, unspecified cause: Secondary | ICD-10-CM

## 2013-06-13 LAB — POCT GLYCOSYLATED HEMOGLOBIN (HGB A1C): HEMOGLOBIN A1C: 7.3

## 2013-06-13 MED ORDER — INSULIN DETEMIR 100 UNIT/ML FLEXPEN: PEN_INJECTOR | SUBCUTANEOUS | Status: DC

## 2013-06-13 MED ORDER — TRAMADOL HCL 50 MG PO TABS: ORAL_TABLET | ORAL | Status: DC

## 2013-06-13 MED ORDER — HYDROCHLOROTHIAZIDE 12.5 MG PO CAPS: 25.0000 mg | ORAL_CAPSULE | Freq: Every day | ORAL | Status: DC

## 2013-06-13 MED ORDER — LAMOTRIGINE 150 MG PO TABS: 150.0000 mg | ORAL_TABLET | Freq: Every day | ORAL | Status: DC

## 2013-06-13 MED ORDER — INSULIN PEN NEEDLE 31G X 5 MM MISC: Status: DC

## 2013-06-13 MED ORDER — TRIAMCINOLONE ACETONIDE 0.1 % EX CREA: 1.0000 "application " | TOPICAL_CREAM | Freq: Two times a day (BID) | CUTANEOUS | Status: DC

## 2013-06-13 MED ORDER — BUPROPION HCL ER (XL) 150 MG PO TB24: ORAL_TABLET | ORAL | Status: DC

## 2013-06-13 MED ORDER — FUROSEMIDE 20 MG PO TABS: ORAL_TABLET | ORAL | Status: DC

## 2013-06-13 MED ORDER — LISDEXAMFETAMINE DIMESYLATE 70 MG PO CAPS: 70.0000 mg | ORAL_CAPSULE | ORAL | Status: DC

## 2013-06-13 MED ORDER — INSULIN LISPRO 100 UNIT/ML (KWIKPEN): PEN_INJECTOR | SUBCUTANEOUS | Status: DC

## 2013-06-13 MED ORDER — AMITRIPTYLINE HCL 10 MG PO TABS: ORAL_TABLET | ORAL | Status: DC

## 2013-06-13 MED ORDER — GLUCAGON (RDNA) 1 MG IJ KIT: PACK | INTRAMUSCULAR | Status: DC

## 2013-06-13 NOTE — Progress Notes (Signed)
   Subjective:    Patient ID: Lis [place], female    DOB: [birth year], 43 y.o.   MRN: [id number]  HPI DM-doing better with meal time sugars. Fasting sugars also doing well in the 100's.  Meal time sugars range from 130 to 200 sometime with dinner being the most elevated. Continues to use levimer twice a day and humalog at meal time. She had one hypoglyemic event when she slept until almost noon and didn't check her sugars. Her husband found her and gave her glucagon. She needs refill since she had to have. No open wounds or unhealing sores.   ADHD- doing well on vyanse. Needs rx sent to mail order for 90 days. Negative for any palpitations, insomnia, or anorexia.   Pt has some itchy places on the backs of her calves. They feel very dry. Worse when she gets out of shower. Not tried anything to make better.    Review of Systems     Objective:   Physical Exam  Constitutional: She is oriented to person, place, and time. She appears well-developed and well-nourished.  HENT:  Head: Normocephalic and atraumatic.  Cardiovascular: Regular rhythm and normal heart sounds.   Rechecked HR at 99  Pulmonary/Chest: Effort normal and breath sounds normal. She has no wheezes.  Neurological: She is alert and oriented to person, place, and time.  Skin:  Patches of erythema with some excoriations present on right calf.   Psychiatric: She has a normal mood and affect. Her behavior is normal.          Assessment & Plan:   Diabetes Mellitus- .[name]. Lab Results  Component Value Date   HGBA1C 7.3 06/13/2013  discussed with pt need for pneumonia vaccines. Pt would like to wait. Almost to goal with blood sugars. Reminded that diet changes can make the biggest improvement. Continue lantus and sliding scale. Mentioned VGO to pt. She is not wanting to change at this time. Glucagon refilled. Follow up in 3 months.   ADHD- printed out one month in office today. Will send others to mail order pharmacy.    Eczema- sent triamcinolone cream to pharmacy to use bid as needed. Follow up if not improving.

## 2013-06-15 ENCOUNTER — Other Ambulatory Visit: Payer: Self-pay | Admitting: *Deleted

## 2013-06-15 MED ORDER — LISDEXAMFETAMINE DIMESYLATE 70 MG PO CAPS: 70.0000 mg | ORAL_CAPSULE | ORAL | Status: DC

## 2013-06-17 ENCOUNTER — Telehealth: Payer: Self-pay | Admitting: *Deleted

## 2013-06-17 NOTE — Telephone Encounter (Signed)
Pt wanted to know if she could get a copy of her FCE along with a letter from you stating that you agree that she is unable to work along with a diagnosis to take to her vocational therapist.

## 2013-06-21 NOTE — Telephone Encounter (Signed)
Can you print off functional capacity assessment and type of a note that states. Currently due to results of functional capacity pt is not able to work.

## 2013-06-23 NOTE — Telephone Encounter (Signed)
Letter printed.

## 2013-06-28 ENCOUNTER — Other Ambulatory Visit: Payer: Self-pay | Admitting: *Deleted

## 2013-07-01 ENCOUNTER — Other Ambulatory Visit: Payer: Self-pay

## 2013-07-01 DIAGNOSIS — F419 Anxiety disorder, unspecified: Secondary | ICD-10-CM

## 2013-07-01 MED ORDER — ALPRAZOLAM 1 MG PO TABS: ORAL_TABLET | ORAL | Status: DC

## 2013-07-01 NOTE — Telephone Encounter (Signed)
Faxed Alprazolam to [facility].

## 2013-09-02 ENCOUNTER — Other Ambulatory Visit: Payer: Self-pay

## 2013-09-02 DIAGNOSIS — F419 Anxiety disorder, unspecified: Secondary | ICD-10-CM

## 2013-09-02 MED ORDER — ZOLPIDEM TARTRATE ER 12.5 MG PO TBCR: EXTENDED_RELEASE_TABLET | ORAL | Status: DC

## 2013-09-02 MED ORDER — ALPRAZOLAM 1 MG PO TABS: ORAL_TABLET | ORAL | Status: DC

## 2013-09-07 ENCOUNTER — Other Ambulatory Visit: Payer: Self-pay | Admitting: Physician Assistant

## 2013-09-07 DIAGNOSIS — Z1231 Encounter for screening mammogram for malignant neoplasm of breast: Secondary | ICD-10-CM

## 2013-09-10 ENCOUNTER — Other Ambulatory Visit: Payer: Self-pay | Admitting: Physician Assistant

## 2013-09-12 ENCOUNTER — Ambulatory Visit: Payer: Self-pay | Admitting: Physician Assistant

## 2013-09-13 ENCOUNTER — Encounter: Payer: Self-pay | Admitting: Physician Assistant

## 2013-09-13 ENCOUNTER — Ambulatory Visit (INDEPENDENT_AMBULATORY_CARE_PROVIDER_SITE_OTHER): Payer: BC Managed Care – PPO | Admitting: Physician Assistant

## 2013-09-13 ENCOUNTER — Telehealth: Payer: Self-pay | Admitting: *Deleted

## 2013-09-13 VITALS — BP 117/73 | HR 101 | Ht 64.0 in | Wt 167.0 lb

## 2013-09-13 DIAGNOSIS — F411 Generalized anxiety disorder: Secondary | ICD-10-CM

## 2013-09-13 DIAGNOSIS — E10610 Type 1 diabetes mellitus with diabetic neuropathic arthropathy: Secondary | ICD-10-CM

## 2013-09-13 DIAGNOSIS — E1043 Type 1 diabetes mellitus with diabetic autonomic (poly)neuropathy: Secondary | ICD-10-CM | POA: Insufficient documentation

## 2013-09-13 DIAGNOSIS — F3289 Other specified depressive episodes: Secondary | ICD-10-CM

## 2013-09-13 DIAGNOSIS — F329 Major depressive disorder, single episode, unspecified: Secondary | ICD-10-CM

## 2013-09-13 DIAGNOSIS — E1049 Type 1 diabetes mellitus with other diabetic neurological complication: Secondary | ICD-10-CM

## 2013-09-13 DIAGNOSIS — F39 Unspecified mood [affective] disorder: Secondary | ICD-10-CM

## 2013-09-13 DIAGNOSIS — Z23 Encounter for immunization: Secondary | ICD-10-CM

## 2013-09-13 DIAGNOSIS — H269 Unspecified cataract: Secondary | ICD-10-CM

## 2013-09-13 LAB — POCT GLYCOSYLATED HEMOGLOBIN (HGB A1C): Hemoglobin A1C: 7

## 2013-09-13 MED ORDER — BUPROPION HCL ER (XL) 150 MG PO TB24: ORAL_TABLET | ORAL | Status: DC

## 2013-09-13 MED ORDER — DAPSONE 5 % EX GEL: 1.0000 "application " | Freq: Two times a day (BID) | CUTANEOUS | Status: DC

## 2013-09-13 MED ORDER — FLUCONAZOLE 150 MG PO TABS: 150.0000 mg | ORAL_TABLET | Freq: Once | ORAL | Status: DC

## 2013-09-13 MED ORDER — INSULIN DETEMIR 100 UNIT/ML FLEXPEN: PEN_INJECTOR | SUBCUTANEOUS | Status: DC

## 2013-09-13 MED ORDER — MELOXICAM 15 MG PO TABS: 15.0000 mg | ORAL_TABLET | Freq: Every day | ORAL | Status: DC

## 2013-09-13 MED ORDER — LISDEXAMFETAMINE DIMESYLATE 70 MG PO CAPS: 70.0000 mg | ORAL_CAPSULE | ORAL | Status: DC

## 2013-09-13 NOTE — Telephone Encounter (Signed)
Pt left vm stating that her dx needs to be type I diabetes and not type II adult onset.  She is thinking that this may be hindering her disability.  She says that she has the original papers from the 7370s with the original dx on it.

## 2013-09-13 NOTE — Progress Notes (Signed)
   Subjective:    Patient ID: Madyson [place], female    DOB: 1970-08-02, 43 y.o.   MRN: [id number]  HPI Pt presents to her 3 month follow up for diabetes. Fasting running 70-110.  Before meals running 110. 2-3 hours after meal running 120. Continues to use levimer 55 units BID and novolog sliding scale at meal time. Currently still trying to get diabetes. Confirms that her diabetes is type I at office visit today. Continues to have significant neuropathy. occansionaly will have hypoglyemic event and uses glucagon. She does have to use her friends walker occasionally on bad days at home to get around.   She would like referral to behavorial health to have there opinion for disability. She has mood disorder and anxiety. Currently feels controlled on medication.      Review of Systems  All other systems reviewed and are negative.      Objective:   Physical Exam  Constitutional: She is oriented to person, place, and time. She appears well-developed and well-nourished.  HENT:  Head: Normocephalic and atraumatic.  Cardiovascular: Normal rate, regular rhythm and normal heart sounds.   Pulmonary/Chest: Effort normal and breath sounds normal.  Neurological: She is alert and oriented to person, place, and time.  Skin: Skin is dry.  Psychiatric: She has a normal mood and affect. Her behavior is normal.          Assessment & Plan:  Diabetes type I with complications- .[name]. Lab Results  Component Value Date   HGBA1C 7.0 09/13/2013  prevnar given today.  mircoalbumin due pt could not give sample.  Foot exam with no feeling in bilateral feet. Sees neurologist/podiatrist for neuropathy. Currently seeking disability for neurpathy.  Refilled diabetic medications.  rx given for walker to help with ambulation due to severity of neur0pathy.     Mood disorder/GAD/depression- will send to psychiatrist for evaluation. Controlled with medication currently needs documentation for disability.

## 2013-09-19 NOTE — Telephone Encounter (Signed)
If she could bring in original copy to make a copy and scan into system. I did change dx.

## 2013-09-22 NOTE — Telephone Encounter (Signed)
Left detailed message notifying pt and requested she bring us a copy of the original document.

## 2013-10-10 ENCOUNTER — Other Ambulatory Visit: Payer: Self-pay | Admitting: Physician Assistant

## 2013-10-18 ENCOUNTER — Ambulatory Visit: Payer: Self-pay

## 2013-10-20 ENCOUNTER — Other Ambulatory Visit: Payer: Self-pay | Admitting: *Deleted

## 2013-10-20 ENCOUNTER — Telehealth: Payer: Self-pay | Admitting: *Deleted

## 2013-10-20 MED ORDER — DAPSONE 5 % EX GEL: 1.0000 "application " | Freq: Two times a day (BID) | CUTANEOUS | Status: DC

## 2013-10-20 NOTE — Telephone Encounter (Signed)
Should not need another referral just need to make jennifer or patricia aware that needs to be somewhere else who has psychiatry.

## 2013-10-20 NOTE — Telephone Encounter (Signed)
Pt states she never heard anything re the behavioral health referral.  I looked back and saw that they noted she would actually need to see a psychiatrist and they didn't have one in the office her referral was sent to.  Do we need another referral placed or can we just send that one to another office?

## 2013-10-24 ENCOUNTER — Other Ambulatory Visit: Payer: Self-pay | Admitting: *Deleted

## 2013-10-24 MED ORDER — AMITRIPTYLINE HCL 10 MG PO TABS: ORAL_TABLET | ORAL | Status: DC

## 2013-10-24 MED ORDER — DAPSONE 5 % EX GEL: 1.0000 "application " | Freq: Two times a day (BID) | CUTANEOUS | Status: DC

## 2013-10-25 ENCOUNTER — Other Ambulatory Visit: Payer: Self-pay | Admitting: Physician Assistant

## 2013-10-25 ENCOUNTER — Ambulatory Visit: Payer: BC Managed Care – PPO

## 2013-10-27 ENCOUNTER — Ambulatory Visit: Payer: BC Managed Care – PPO

## 2013-11-18 ENCOUNTER — Other Ambulatory Visit: Payer: Self-pay | Admitting: Physician Assistant

## 2013-12-15 ENCOUNTER — Ambulatory Visit: Payer: Self-pay | Admitting: Family Medicine

## 2013-12-22 ENCOUNTER — Other Ambulatory Visit: Payer: Self-pay | Admitting: *Deleted

## 2013-12-22 DIAGNOSIS — E1110 Type 2 diabetes mellitus with ketoacidosis without coma: Secondary | ICD-10-CM

## 2013-12-22 MED ORDER — BUPROPION HCL ER (XL) 150 MG PO TB24: ORAL_TABLET | ORAL | Status: DC

## 2013-12-22 MED ORDER — INSULIN LISPRO 100 UNIT/ML (KWIKPEN): PEN_INJECTOR | SUBCUTANEOUS | Status: DC

## 2013-12-22 MED ORDER — GLUCAGON (RDNA) 1 MG IJ KIT: PACK | INTRAMUSCULAR | Status: DC

## 2013-12-22 NOTE — Progress Notes (Signed)
Melissa [name] left a voicemail that she needed refills of meds and all I was able to catch was that she needed a kwik pen sent to pharmacy. I tried calling her but I am unsure what meds she needs refilled and she needs to schedule a follow up to receive any long term refills. Corliss SkainsJamie Jonah Nestle, CMA

## 2014-01-02 ENCOUNTER — Ambulatory Visit (INDEPENDENT_AMBULATORY_CARE_PROVIDER_SITE_OTHER): Payer: BC Managed Care – PPO

## 2014-01-02 ENCOUNTER — Encounter: Payer: Self-pay | Admitting: Physician Assistant

## 2014-01-02 ENCOUNTER — Ambulatory Visit (INDEPENDENT_AMBULATORY_CARE_PROVIDER_SITE_OTHER): Payer: BC Managed Care – PPO | Admitting: Physician Assistant

## 2014-01-02 VITALS — BP 156/93 | HR 108 | Ht 64.0 in | Wt 174.0 lb

## 2014-01-02 DIAGNOSIS — G4700 Insomnia, unspecified: Secondary | ICD-10-CM

## 2014-01-02 DIAGNOSIS — E1042 Type 1 diabetes mellitus with diabetic polyneuropathy: Secondary | ICD-10-CM

## 2014-01-02 DIAGNOSIS — G990 Autonomic neuropathy in diseases classified elsewhere: Secondary | ICD-10-CM | POA: Diagnosis not present

## 2014-01-02 DIAGNOSIS — H269 Unspecified cataract: Secondary | ICD-10-CM

## 2014-01-02 DIAGNOSIS — F411 Generalized anxiety disorder: Secondary | ICD-10-CM

## 2014-01-02 DIAGNOSIS — Z23 Encounter for immunization: Secondary | ICD-10-CM

## 2014-01-02 DIAGNOSIS — F909 Attention-deficit hyperactivity disorder, unspecified type: Secondary | ICD-10-CM

## 2014-01-02 DIAGNOSIS — E1043 Type 1 diabetes mellitus with diabetic autonomic (poly)neuropathy: Secondary | ICD-10-CM

## 2014-01-02 DIAGNOSIS — F900 Attention-deficit hyperactivity disorder, predominantly inattentive type: Secondary | ICD-10-CM

## 2014-01-02 DIAGNOSIS — F419 Anxiety disorder, unspecified: Secondary | ICD-10-CM | POA: Diagnosis not present

## 2014-01-02 LAB — POCT UA - MICROALBUMIN
Albumin/Creatinine Ratio, Urine, POC: <30
Creatinine, POC: 200 mg/dL
Microalbumin Ur, POC: 10 mg/L

## 2014-01-02 LAB — POCT GLYCOSYLATED HEMOGLOBIN (HGB A1C): HEMOGLOBIN A1C: 7.8

## 2014-01-02 MED ORDER — ZOLPIDEM TARTRATE ER 12.5 MG PO TBCR: EXTENDED_RELEASE_TABLET | ORAL | Status: DC

## 2014-01-02 MED ORDER — INSULIN PEN NEEDLE 31G X 5 MM MISC: Status: DC

## 2014-01-02 MED ORDER — GLUCOSE BLOOD VI STRP: ORAL_STRIP | Status: DC

## 2014-01-02 MED ORDER — ALPRAZOLAM 1 MG PO TABS: ORAL_TABLET | ORAL | Status: DC

## 2014-01-02 MED ORDER — GLUCAGON (RDNA) 1 MG IJ KIT: PACK | INTRAMUSCULAR | Status: DC

## 2014-01-02 MED ORDER — HYDROCHLOROTHIAZIDE 12.5 MG PO CAPS: ORAL_CAPSULE | ORAL | Status: DC

## 2014-01-02 MED ORDER — BUPROPION HCL ER (XL) 150 MG PO TB24: ORAL_TABLET | ORAL | Status: DC

## 2014-01-02 MED ORDER — LISDEXAMFETAMINE DIMESYLATE 70 MG PO CAPS: 70.0000 mg | ORAL_CAPSULE | ORAL | Status: DC

## 2014-01-02 MED ORDER — AMITRIPTYLINE HCL 10 MG PO TABS: ORAL_TABLET | ORAL | Status: DC

## 2014-01-02 MED ORDER — AMBULATORY NON FORMULARY MEDICATION: Status: DC

## 2014-01-02 MED ORDER — INSULIN DETEMIR 100 UNIT/ML FLEXPEN: PEN_INJECTOR | SUBCUTANEOUS | Status: DC

## 2014-01-02 MED ORDER — INSULIN LISPRO 100 UNIT/ML (KWIKPEN)
PEN_INJECTOR | SUBCUTANEOUS | Status: DC
Start: 2014-01-02 — End: 2014-07-13

## 2014-01-04 NOTE — Progress Notes (Signed)
   Subjective:    Patient ID: Karis [place], female    DOB: [birth year], 43 y.o.   MRN: [id number]  HPI Patient presents to the clinic for medication refills and 3 month follow-up.  DM-patient is currently taking  Her insulin as directed long-acting and sliding scale during the day. She is previously been controlling well. She has had about 3 months. She has had bilateral cataracts removed in the last 3 months with some complications with retinal bleed. She has also had a couple round of antibiotics with a leg cellulitis. She notes to be on prednisone for 6 weeks total. Her sugars have been more out of control. She reports to have also had a few hypoglyemic events controlled with glucagon. Continues to struggle with neuropathy, peripheral.   ADHD-well controlled on current dose of Vyvanse.   Anxiety- well controlled on xanax, wellbutrin, lamictal.   inomnia- well controlled on ambien.    Review of Systems  All other systems reviewed and are negative.      Objective:   Physical Exam  Constitutional: She is oriented to person, place, and time. She appears well-developed and well-nourished.  HENT:  Head: Normocephalic and atraumatic.  Cardiovascular: Normal rate, regular rhythm and normal heart sounds.   Pulmonary/Chest: Effort normal and breath sounds normal.  Neurological: She is alert and oriented to person, place, and time.  Skin: Skin is dry.  Psychiatric: She has a normal mood and affect. Her behavior is normal.          Assessment & Plan:  Diabetes type I with peripheral neuropathy- .. Lab Results  Component Value Date   HGBA1C 7.8 01/02/2014   .[name]. Results for orders placed or performed in visit on 01/02/14  POCT HgB A1C  Result Value Ref Range   Hemoglobin A1C 7.8   POCT UA - Microalbumin  Result Value Ref Range   Microalbumin Ur, POC 10 mg/L   Creatinine, POC 200 mg/dL   Albumin/Creatinine Ratio, Urine, POC <30     Up from 3 months ago. She has had 2  surgeries and been on prednisone for about 6 weeks. She has also had at least 2 rounds of abx. Discussed tighter control. Pt on long acting and sliding scale insulin. Glucagon filled for any lows.  Insulin refilled today.    ADHD- refilled for 3 months.   GAD- xanax and wellbutrin and lamictal refilled.   Insomnia- ambien refilled.

## 2014-01-10 ENCOUNTER — Other Ambulatory Visit: Payer: Self-pay | Admitting: Physician Assistant

## 2014-01-17 ENCOUNTER — Other Ambulatory Visit: Payer: Self-pay | Admitting: Physician Assistant

## 2014-02-28 ENCOUNTER — Telehealth: Payer: Self-pay | Admitting: *Deleted

## 2014-02-28 NOTE — Telephone Encounter (Signed)
[facility] day [facility] [place] apparently [place] moved to [place]. I did leave a voice mail to return call regarding medication. Please advise.

## 2014-02-28 NOTE — Telephone Encounter (Signed)
Ok wait until we speak with pt for clarification before approving early refill.

## 2014-03-17 ENCOUNTER — Other Ambulatory Visit: Payer: Self-pay | Admitting: Physician Assistant

## 2014-03-17 DIAGNOSIS — E1043 Type 1 diabetes mellitus with diabetic autonomic (poly)neuropathy: Secondary | ICD-10-CM

## 2014-03-21 MED ORDER — GLUCAGON (RDNA) 1 MG IJ KIT: PACK | INTRAMUSCULAR | Status: DC

## 2014-03-21 MED ORDER — GLUCOSE BLOOD VI STRP: ORAL_STRIP | Status: DC

## 2014-04-04 ENCOUNTER — Ambulatory Visit: Payer: Self-pay | Admitting: Physician Assistant

## 2014-04-14 ENCOUNTER — Encounter: Payer: Self-pay | Admitting: Physician Assistant

## 2014-04-14 ENCOUNTER — Ambulatory Visit (INDEPENDENT_AMBULATORY_CARE_PROVIDER_SITE_OTHER): Payer: Self-pay | Admitting: Physician Assistant

## 2014-04-14 VITALS — BP 135/79 | HR 95 | Ht 64.0 in | Wt 172.0 lb

## 2014-04-14 DIAGNOSIS — E1069 Type 1 diabetes mellitus with other specified complication: Secondary | ICD-10-CM

## 2014-04-14 DIAGNOSIS — E1043 Type 1 diabetes mellitus with diabetic autonomic (poly)neuropathy: Secondary | ICD-10-CM

## 2014-04-14 DIAGNOSIS — G990 Autonomic neuropathy in diseases classified elsewhere: Secondary | ICD-10-CM

## 2014-04-14 DIAGNOSIS — F909 Attention-deficit hyperactivity disorder, unspecified type: Secondary | ICD-10-CM

## 2014-04-14 DIAGNOSIS — F900 Attention-deficit hyperactivity disorder, predominantly inattentive type: Secondary | ICD-10-CM

## 2014-04-14 DIAGNOSIS — E1042 Type 1 diabetes mellitus with diabetic polyneuropathy: Secondary | ICD-10-CM

## 2014-04-14 LAB — POCT GLYCOSYLATED HEMOGLOBIN (HGB A1C): Hemoglobin A1C: 7.5

## 2014-04-14 MED ORDER — AMBULATORY NON FORMULARY MEDICATION: Status: DC

## 2014-04-14 MED ORDER — TRIAMCINOLONE ACETONIDE 0.1 % EX CREA: 1.0000 "application " | TOPICAL_CREAM | Freq: Two times a day (BID) | CUTANEOUS | Status: DC

## 2014-04-14 MED ORDER — LISDEXAMFETAMINE DIMESYLATE 70 MG PO CAPS: 70.0000 mg | ORAL_CAPSULE | ORAL | Status: DC

## 2014-04-14 MED ORDER — TRAMADOL HCL 50 MG PO TABS: ORAL_TABLET | ORAL | Status: DC

## 2014-04-14 NOTE — Patient Instructions (Signed)
[facility] to switch for levimer.

## 2014-04-18 ENCOUNTER — Telehealth: Payer: Self-pay | Admitting: Physician Assistant

## 2014-04-18 NOTE — Progress Notes (Signed)
   Subjective:    Patient ID: Melissa [name], female    DOB: 09-02-1970, 44 y.o.   MRN: [id number]  HPI Patient is a 44 year old female who presents to the clinic for 3 month follow-up on diabetes.  Patient takes Levemir twice daily along with sliding scale Humalog. She checks her fasting sugars and run 90-150. Meal time vary between 130-200. She admits her diet is not the best. She also admits that she has had multiple antibiotics and steroids over the past 3 months. She's had a few lows where she has had to use her glucagon. She certainly has multiple complications from diabetes were she is seeing specialists for wound care and neuropathy.   ADHD-doing well controlled on vyvanse. No problems or concerns.      Review of Systems  All other systems reviewed and are negative.      Objective:   Physical Exam  Constitutional: She is oriented to person, place, and time. She appears well-developed and well-nourished.  HENT:  Head: Normocephalic and atraumatic.  Cardiovascular: Normal rate, regular rhythm and normal heart sounds.   Pulmonary/Chest: Effort normal and breath sounds normal.  Neurological: She is alert and oriented to person, place, and time.  Skin: Skin is dry.  Psychiatric: She has a normal mood and affect. Her behavior is normal.          Assessment & Plan:   1 diabetes with complications- .[name]. Lab Results  Component Value Date   HGBA1C 7.5 04/14/2014   Patient is doing fairly well however our goal is still under 7. She is still trying to get disability based on complications from diabetes.  Continue on Levemir and Humalog at this time. Continue to work on diabetic diet. We have discussed trying [place] a new long acting insulin to replace levemir. i will look into cost and conversion from levemir.   Per patient last eye exam was 08/2013.  Microalbumin up to date. Foot exam up to date.     Adult ADHD-blood pressure and pulse did come down by the end of visit.  Refilled Vyvanse for 3 months. Follow-up in 3 months.

## 2014-04-18 NOTE — Telephone Encounter (Signed)
Need copy of last diabetic eye exam for records.

## 2014-04-26 ENCOUNTER — Encounter: Payer: Self-pay | Admitting: Physician Assistant

## 2014-04-26 DIAGNOSIS — H349 Unspecified retinal vascular occlusion: Secondary | ICD-10-CM

## 2014-04-26 DIAGNOSIS — H4312 Vitreous hemorrhage, left eye: Secondary | ICD-10-CM | POA: Insufficient documentation

## 2014-04-26 DIAGNOSIS — E1039 Type 1 diabetes mellitus with other diabetic ophthalmic complication: Secondary | ICD-10-CM | POA: Insufficient documentation

## 2014-04-26 DIAGNOSIS — E103599 Type 1 diabetes mellitus with proliferative diabetic retinopathy without macular edema, unspecified eye: Secondary | ICD-10-CM | POA: Insufficient documentation

## 2014-04-26 NOTE — Telephone Encounter (Signed)
LMOM for pt to return my call for the name of her eye dr.

## 2014-04-26 NOTE — Telephone Encounter (Signed)
Notes are being faxed from [facility].

## 2014-04-27 ENCOUNTER — Other Ambulatory Visit: Payer: Self-pay

## 2014-04-27 DIAGNOSIS — F419 Anxiety disorder, unspecified: Secondary | ICD-10-CM

## 2014-04-27 MED ORDER — ALPRAZOLAM 1 MG PO TABS: ORAL_TABLET | ORAL | Status: DC

## 2014-06-15 IMAGING — CR DG CHEST 2V
2 series · 2 of 2 positions shown · non-contrast
Comparison: 10/06/2010

CLINICAL DATA: Open wound, pre hyperbaric oxygen therapy evaluation

CHEST - 2 VIEW

[w chest pa *]
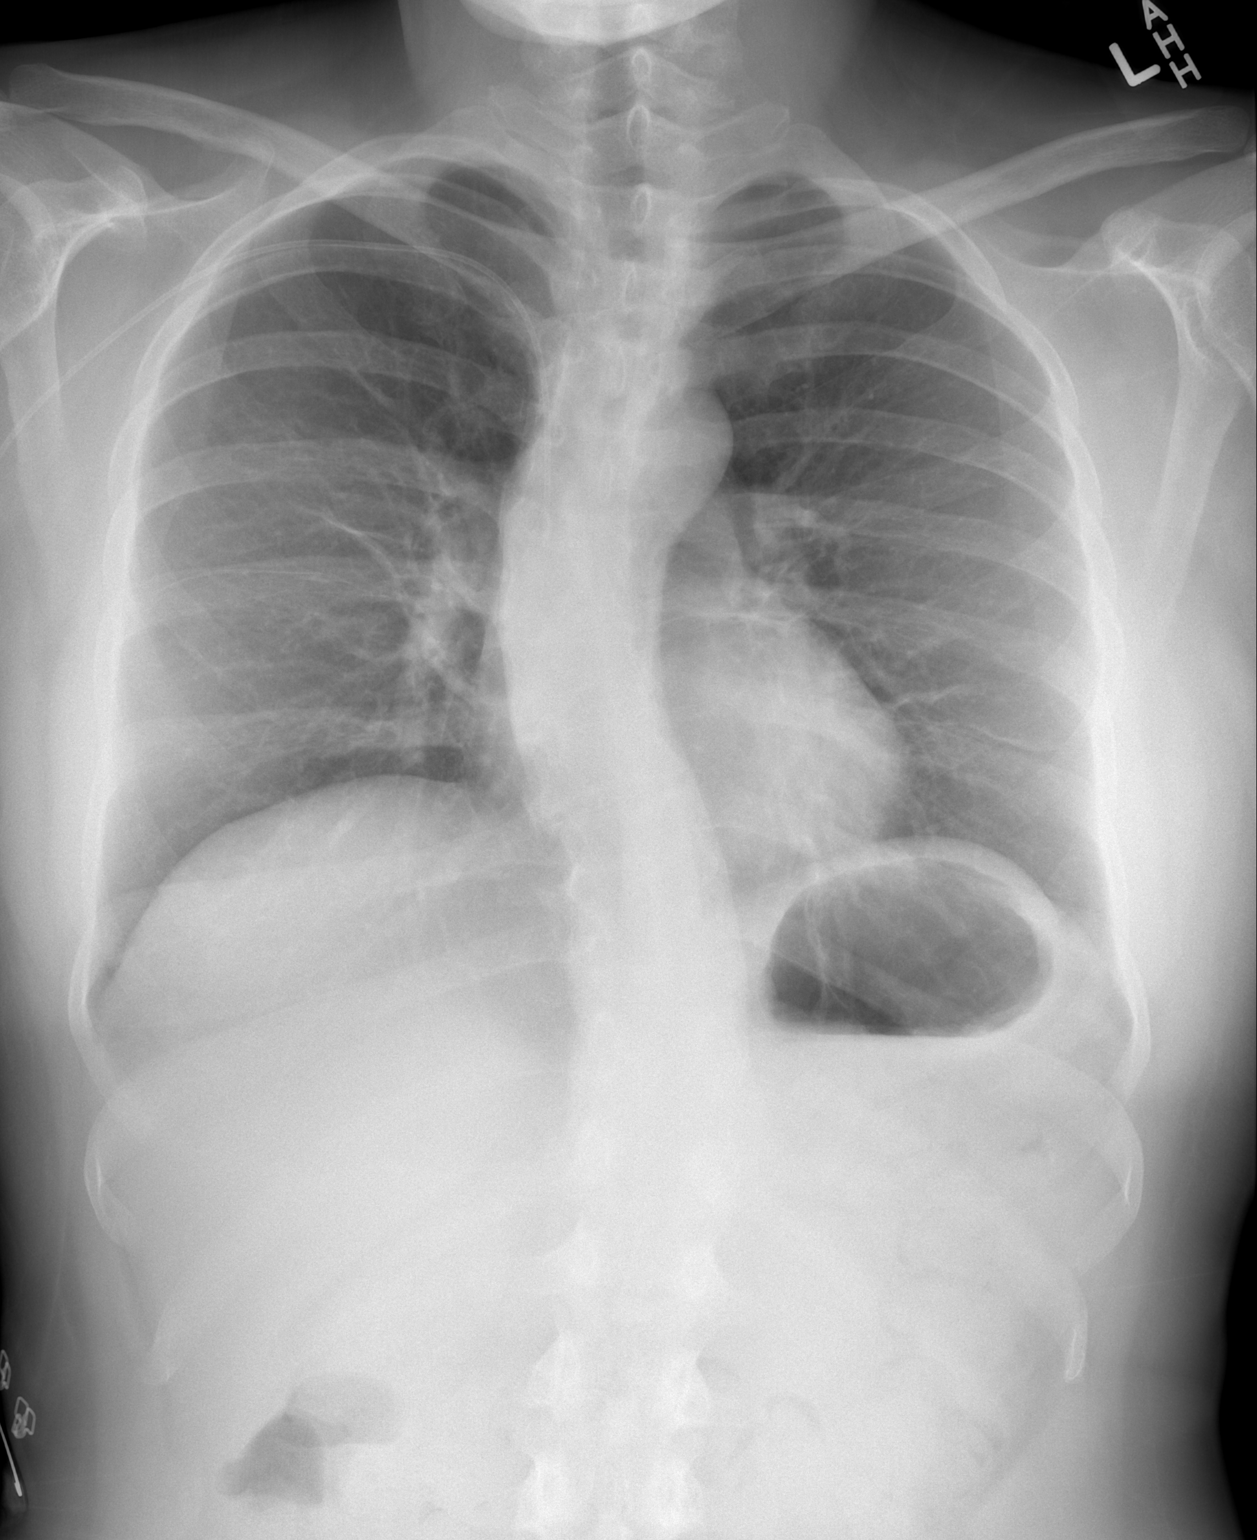

[w chest lat]
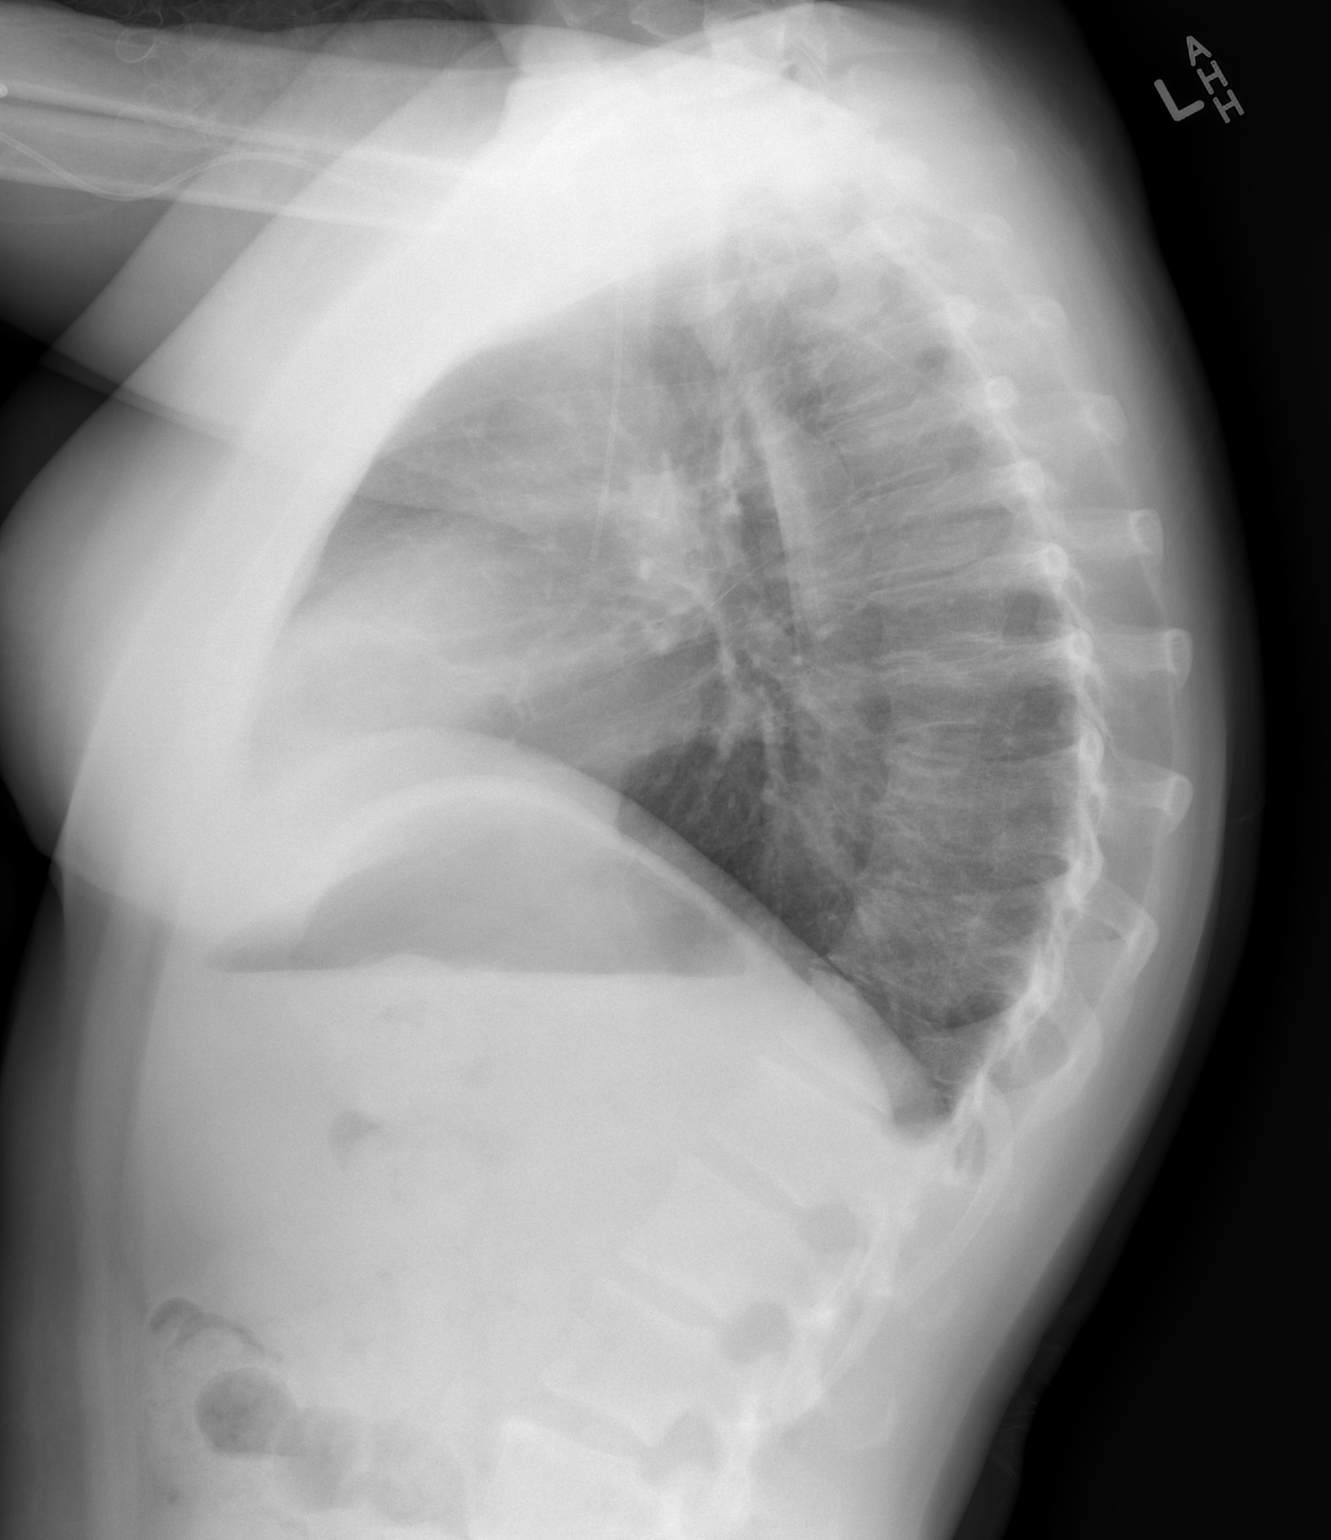

[2 of 2 positions shown; findings below may reference images not displayed]

FINDINGS: Right arm PICC line tip projects over SVC.
Normal heart size, mediastinal contours, and pulmonary vascularity.
Minimal scarring bilaterally.
Lungs otherwise clear.
Minimal chronic peribronchial thickening.
No pleural effusion or pneumothorax.
Biconvex thoracolumbar scoliosis.
IMPRESSION: Minimal bronchitic changes and bilateral scarring.
No acute abnormalities.

## 2014-06-15 IMAGING — CR DG TOE 2ND 2+V*L*
4 series · 4 of 4 positions shown · non-contrast
Comparison: None.

CLINICAL DATA: History of open wound.  Prehyperbaric oxygenation
therapy.  History of osteomyelitis.  No pain in left second toe.

LEFT SECOND TOE

[t toes ap left]
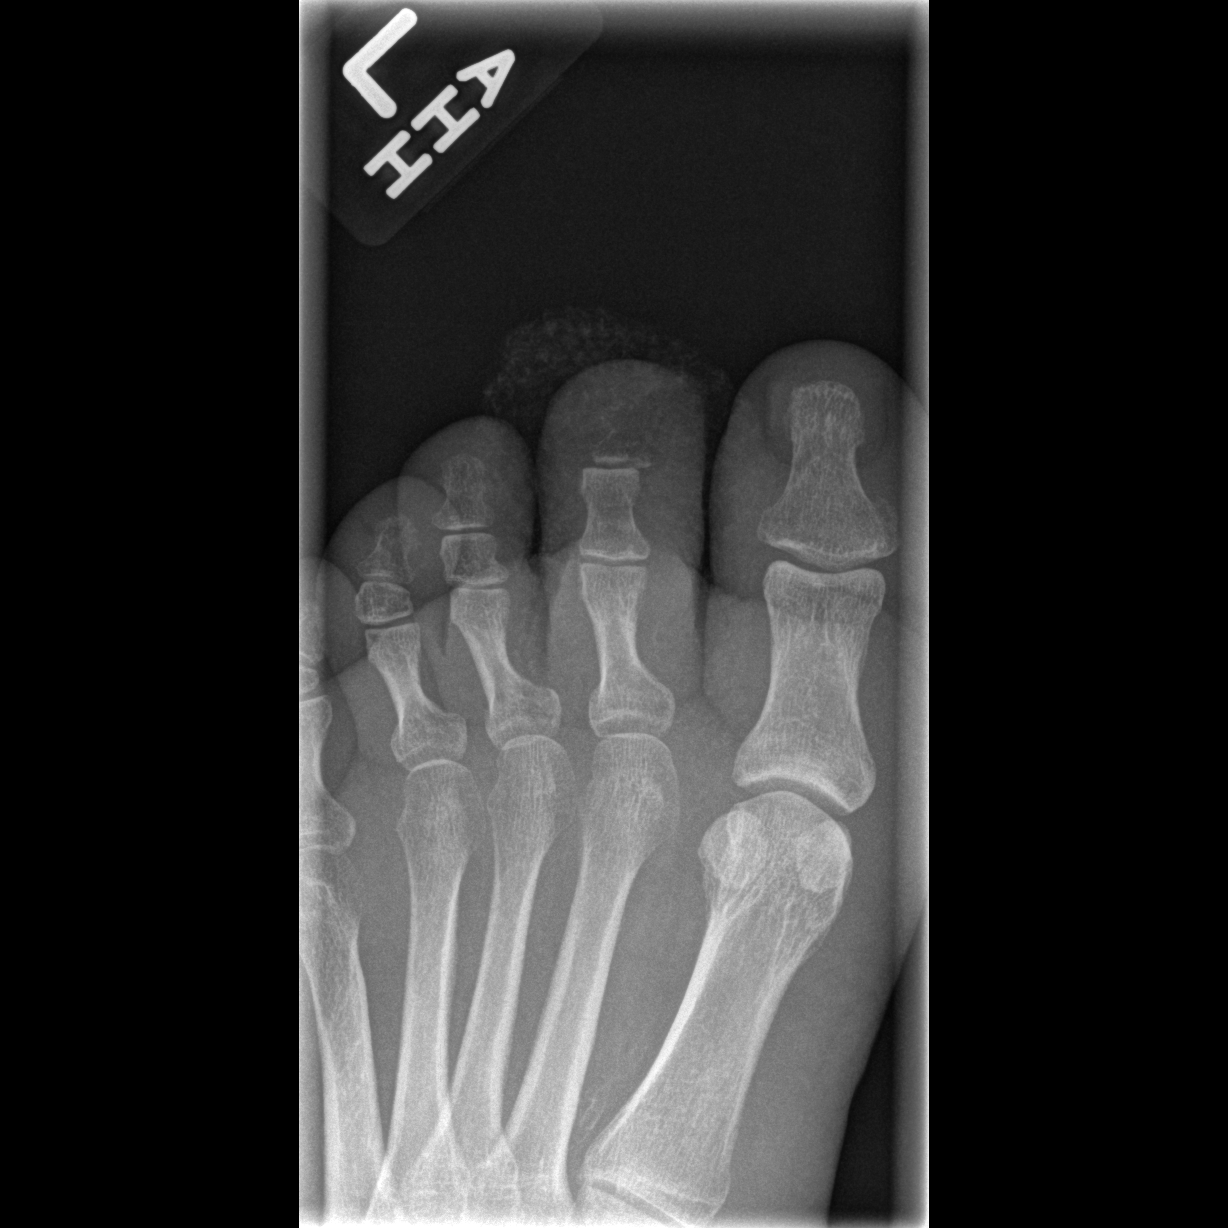

[t toes oblique left]
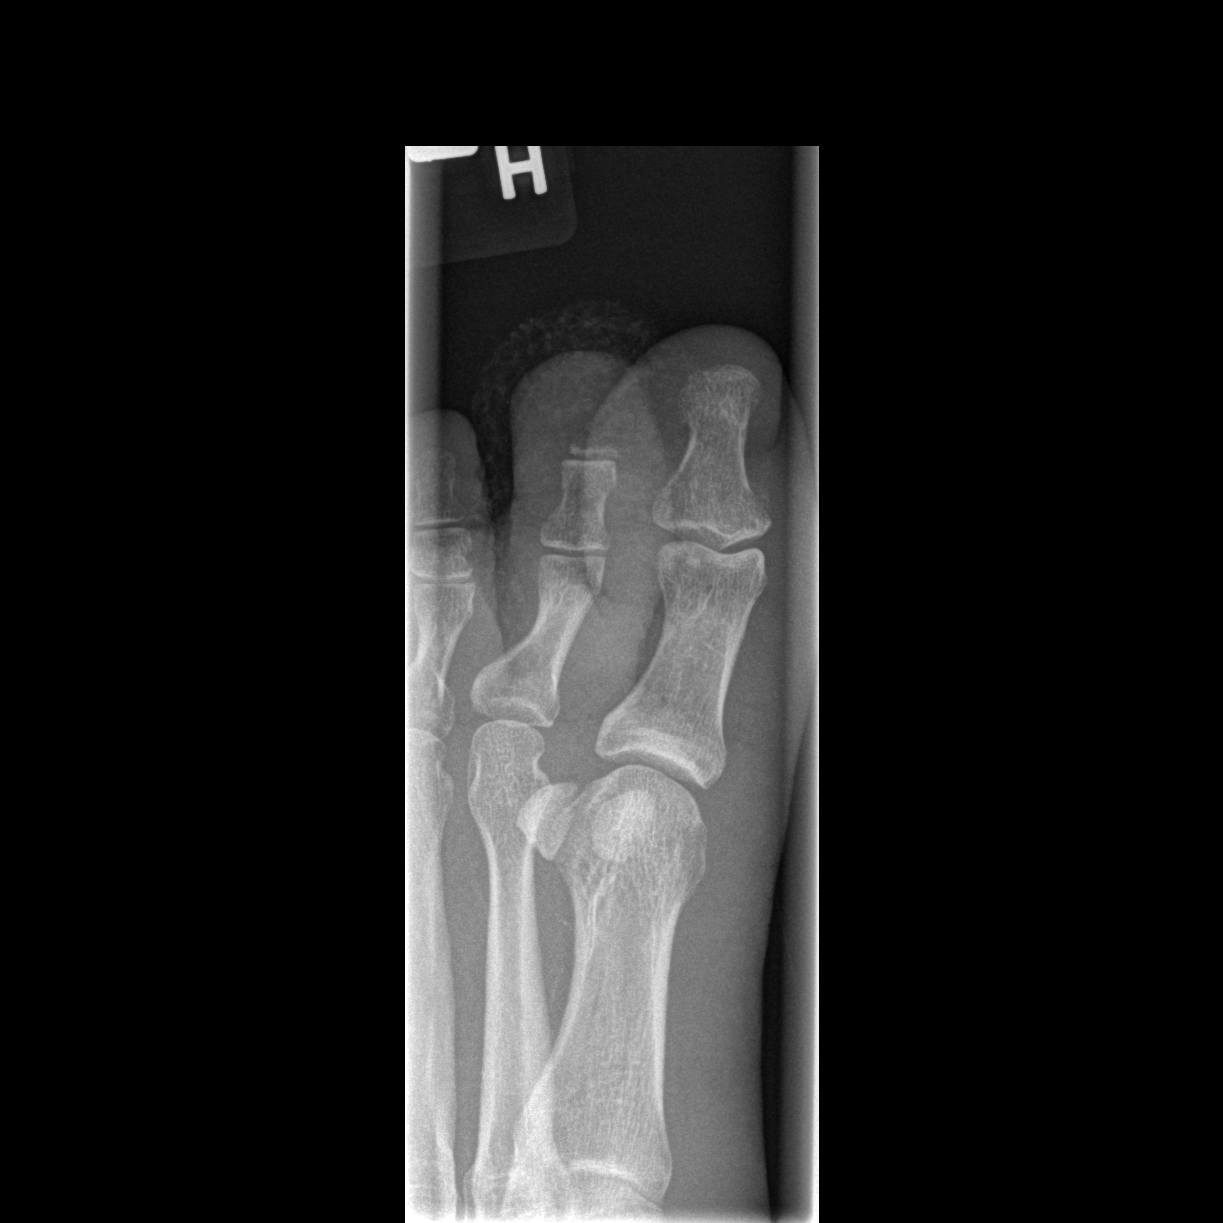

[t toes lateral left (1 of 2)]
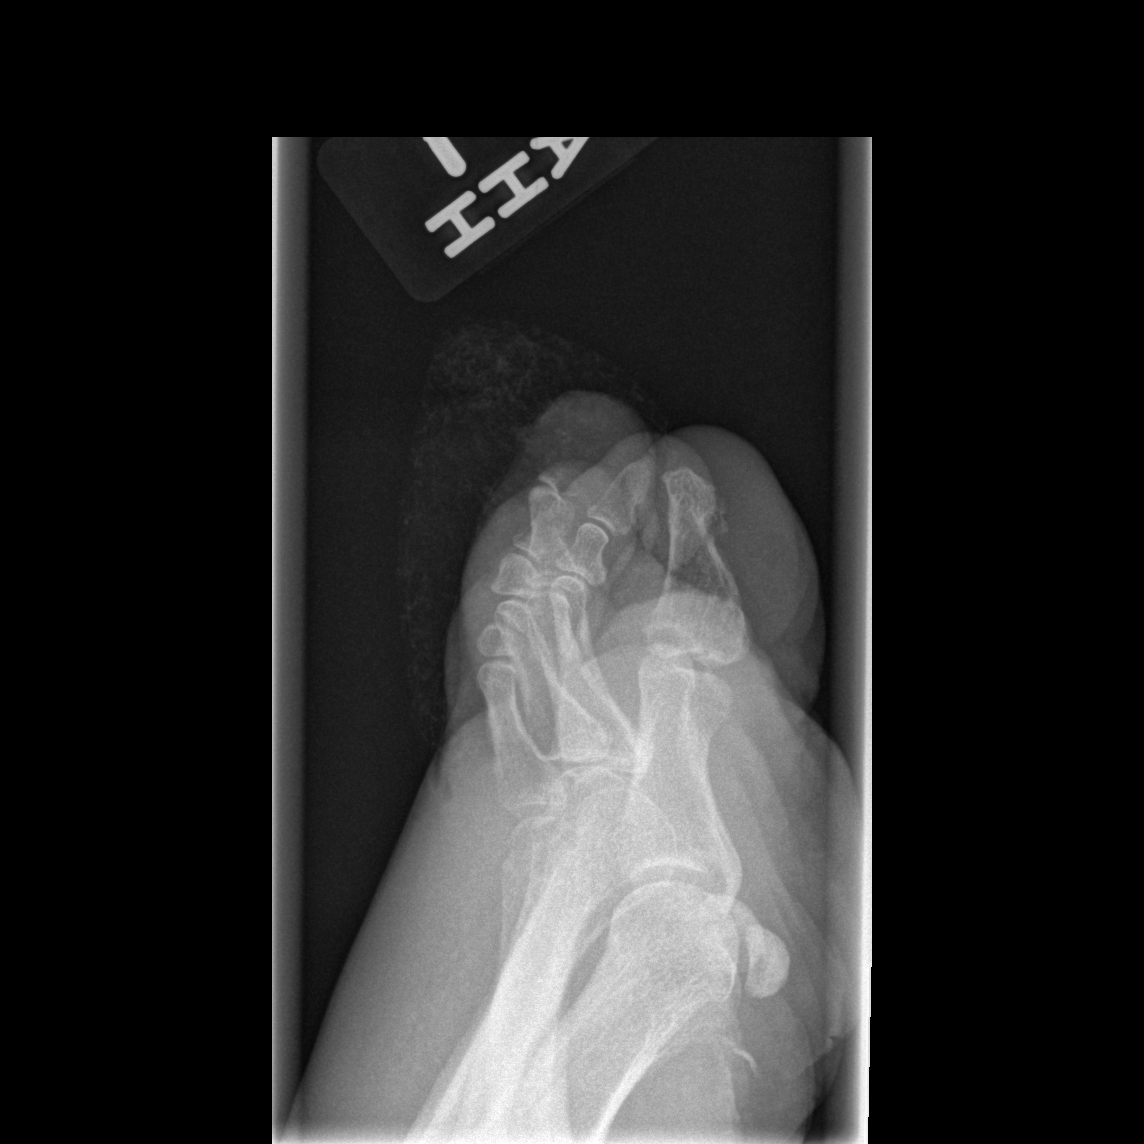

[t toes lateral left (2 of 2)]
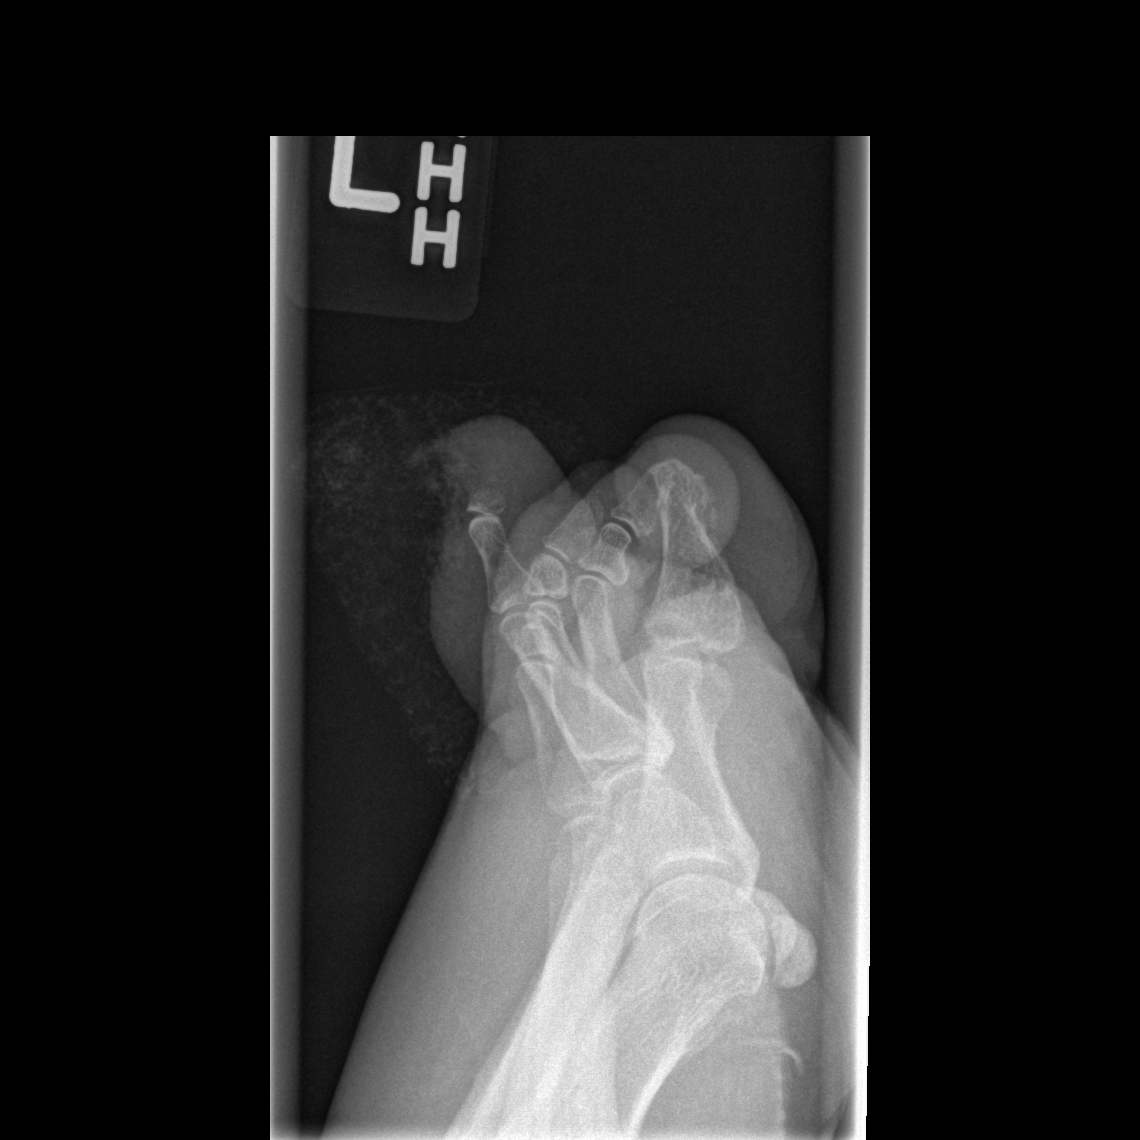

[4 of 4 positions shown; findings below may reference images not displayed]

FINDINGS: There is destruction of the majority of the bone of the
distal phalanx of the second toe.  There appears to be
fragmentation of the residual proximal bone of the distal phalanx.
The DIP joint space is preserved.  No destruction of the middle
phalanx is evident.  There is soft tissue swelling.
IMPRESSION: There is history of osteomyelitis and open wound.  There is
destruction of the majority of the bone of the distal phalanx of
the second toe.  There appears to be fragmentation of the residual
proximal bone of the distal phalanx.  No loss of DIP joint space is
seen.  No destruction of middle phalanx of second toe is evident.

## 2014-07-13 ENCOUNTER — Encounter: Payer: Self-pay | Admitting: Family Medicine

## 2014-07-13 ENCOUNTER — Ambulatory Visit (INDEPENDENT_AMBULATORY_CARE_PROVIDER_SITE_OTHER): Payer: BLUE CROSS/BLUE SHIELD | Admitting: Family Medicine

## 2014-07-13 VITALS — BP 144/87 | HR 99 | Wt 175.0 lb

## 2014-07-13 DIAGNOSIS — G990 Autonomic neuropathy in diseases classified elsewhere: Secondary | ICD-10-CM | POA: Diagnosis not present

## 2014-07-13 DIAGNOSIS — F419 Anxiety disorder, unspecified: Secondary | ICD-10-CM

## 2014-07-13 DIAGNOSIS — F900 Attention-deficit hyperactivity disorder, predominantly inattentive type: Secondary | ICD-10-CM

## 2014-07-13 DIAGNOSIS — E1042 Type 1 diabetes mellitus with diabetic polyneuropathy: Secondary | ICD-10-CM | POA: Diagnosis not present

## 2014-07-13 DIAGNOSIS — M79605 Pain in left leg: Secondary | ICD-10-CM

## 2014-07-13 DIAGNOSIS — I10 Essential (primary) hypertension: Secondary | ICD-10-CM

## 2014-07-13 DIAGNOSIS — E1043 Type 1 diabetes mellitus with diabetic autonomic (poly)neuropathy: Secondary | ICD-10-CM

## 2014-07-13 DIAGNOSIS — F909 Attention-deficit hyperactivity disorder, unspecified type: Secondary | ICD-10-CM

## 2014-07-13 LAB — POCT GLYCOSYLATED HEMOGLOBIN (HGB A1C): Hemoglobin A1C: 7.1

## 2014-07-13 MED ORDER — HYDROCHLOROTHIAZIDE 12.5 MG PO CAPS: ORAL_CAPSULE | ORAL | Status: DC

## 2014-07-13 MED ORDER — ALPRAZOLAM 1 MG PO TABS: ORAL_TABLET | ORAL | Status: DC

## 2014-07-13 MED ORDER — LISDEXAMFETAMINE DIMESYLATE 70 MG PO CAPS: 70.0000 mg | ORAL_CAPSULE | ORAL | Status: DC

## 2014-07-13 MED ORDER — LAMOTRIGINE 150 MG PO TABS: 150.0000 mg | ORAL_TABLET | Freq: Every day | ORAL | Status: DC

## 2014-07-13 MED ORDER — AMITRIPTYLINE HCL 10 MG PO TABS: ORAL_TABLET | ORAL | Status: DC

## 2014-07-13 MED ORDER — BUPROPION HCL ER (XL) 150 MG PO TB24: ORAL_TABLET | ORAL | Status: DC

## 2014-07-13 MED ORDER — TRAMADOL HCL 50 MG PO TABS: ORAL_TABLET | ORAL | Status: DC

## 2014-07-13 MED ORDER — ZOLPIDEM TARTRATE ER 12.5 MG PO TBCR: EXTENDED_RELEASE_TABLET | ORAL | Status: DC

## 2014-07-13 MED ORDER — AMBULATORY NON FORMULARY MEDICATION: Status: DC

## 2014-07-13 MED ORDER — DAPSONE 5 % EX GEL: 1.0000 "application " | Freq: Two times a day (BID) | CUTANEOUS | Status: DC

## 2014-07-13 MED ORDER — INSULIN LISPRO 100 UNIT/ML (KWIKPEN): PEN_INJECTOR | SUBCUTANEOUS | Status: DC

## 2014-07-13 MED ORDER — INSULIN DETEMIR 100 UNIT/ML FLEXPEN: PEN_INJECTOR | SUBCUTANEOUS | Status: DC

## 2014-07-13 MED ORDER — FLUCONAZOLE 150 MG PO TABS: ORAL_TABLET | ORAL | Status: AC
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
## 2014-10-12 ENCOUNTER — Other Ambulatory Visit: Payer: Self-pay | Admitting: Family Medicine

## 2014-10-16 ENCOUNTER — Ambulatory Visit: Payer: Self-pay | Admitting: Physician Assistant

## 2014-10-16 ENCOUNTER — Other Ambulatory Visit: Payer: Self-pay

## 2014-10-16 MED ORDER — MELOXICAM 15 MG PO TABS: 15.0000 mg | ORAL_TABLET | Freq: Every day | ORAL | Status: DC

## 2014-10-16 MED ORDER — INSULIN LISPRO 100 UNIT/ML (KWIKPEN): PEN_INJECTOR | SUBCUTANEOUS | Status: DC

## 2014-10-16 NOTE — Telephone Encounter (Signed)
Called and left a message advising patient to call and schedule a follow up appointment.

## 2014-11-08 ENCOUNTER — Encounter: Payer: Self-pay | Admitting: Physician Assistant

## 2014-11-08 ENCOUNTER — Ambulatory Visit (INDEPENDENT_AMBULATORY_CARE_PROVIDER_SITE_OTHER): Payer: BLUE CROSS/BLUE SHIELD | Admitting: Physician Assistant

## 2014-11-08 VITALS — BP 144/82 | HR 103 | Ht 64.0 in

## 2014-11-08 DIAGNOSIS — Z23 Encounter for immunization: Secondary | ICD-10-CM

## 2014-11-08 DIAGNOSIS — E1042 Type 1 diabetes mellitus with diabetic polyneuropathy: Secondary | ICD-10-CM

## 2014-11-08 DIAGNOSIS — G990 Autonomic neuropathy in diseases classified elsewhere: Secondary | ICD-10-CM

## 2014-11-08 DIAGNOSIS — F902 Attention-deficit hyperactivity disorder, combined type: Secondary | ICD-10-CM

## 2014-11-08 DIAGNOSIS — E1043 Type 1 diabetes mellitus with diabetic autonomic (poly)neuropathy: Secondary | ICD-10-CM

## 2014-11-08 LAB — POCT GLYCOSYLATED HEMOGLOBIN (HGB A1C): Hemoglobin A1C: 7.1

## 2014-11-08 MED ORDER — LISDEXAMFETAMINE DIMESYLATE 70 MG PO CAPS: 70.0000 mg | ORAL_CAPSULE | ORAL | Status: DC

## 2014-11-08 MED ORDER — FLUCONAZOLE 150 MG PO TABS: 150.0000 mg | ORAL_TABLET | Freq: Once | ORAL | Status: DC

## 2014-11-08 MED ORDER — TRAMADOL HCL 50 MG PO TABS: ORAL_TABLET | ORAL | Status: DC
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
## 2014-12-03 ENCOUNTER — Other Ambulatory Visit: Payer: Self-pay | Admitting: Family Medicine

## 2015-01-21 ENCOUNTER — Other Ambulatory Visit: Payer: Self-pay | Admitting: Physician Assistant

## 2015-02-01 LAB — HM DIABETES EYE EXAM

## 2015-02-07 ENCOUNTER — Encounter: Payer: Self-pay | Admitting: Physician Assistant

## 2015-02-07 ENCOUNTER — Ambulatory Visit (INDEPENDENT_AMBULATORY_CARE_PROVIDER_SITE_OTHER): Payer: BLUE CROSS/BLUE SHIELD | Admitting: Physician Assistant

## 2015-02-07 VITALS — BP 140/88 | HR 108 | Ht 64.0 in

## 2015-02-07 DIAGNOSIS — F419 Anxiety disorder, unspecified: Secondary | ICD-10-CM

## 2015-02-07 DIAGNOSIS — G5603 Carpal tunnel syndrome, bilateral upper limbs: Secondary | ICD-10-CM

## 2015-02-07 DIAGNOSIS — K089 Disorder of teeth and supporting structures, unspecified: Secondary | ICD-10-CM

## 2015-02-07 DIAGNOSIS — E103593 Type 1 diabetes mellitus with proliferative diabetic retinopathy without macular edema, bilateral: Secondary | ICD-10-CM

## 2015-02-07 DIAGNOSIS — F900 Attention-deficit hyperactivity disorder, predominantly inattentive type: Secondary | ICD-10-CM | POA: Diagnosis not present

## 2015-02-07 DIAGNOSIS — E876 Hypokalemia: Secondary | ICD-10-CM

## 2015-02-07 DIAGNOSIS — I10 Essential (primary) hypertension: Secondary | ICD-10-CM

## 2015-02-07 DIAGNOSIS — M5136 Other intervertebral disc degeneration, lumbar region: Secondary | ICD-10-CM

## 2015-02-07 DIAGNOSIS — F411 Generalized anxiety disorder: Secondary | ICD-10-CM

## 2015-02-07 DIAGNOSIS — F909 Attention-deficit hyperactivity disorder, unspecified type: Secondary | ICD-10-CM

## 2015-02-07 DIAGNOSIS — E1043 Type 1 diabetes mellitus with diabetic autonomic (poly)neuropathy: Secondary | ICD-10-CM

## 2015-02-07 DIAGNOSIS — M79671 Pain in right foot: Secondary | ICD-10-CM

## 2015-02-07 DIAGNOSIS — M79672 Pain in left foot: Secondary | ICD-10-CM

## 2015-02-07 DIAGNOSIS — Z23 Encounter for immunization: Secondary | ICD-10-CM

## 2015-02-07 DIAGNOSIS — G629 Polyneuropathy, unspecified: Secondary | ICD-10-CM

## 2015-02-07 DIAGNOSIS — L03119 Cellulitis of unspecified part of limb: Secondary | ICD-10-CM

## 2015-02-07 DIAGNOSIS — H269 Unspecified cataract: Secondary | ICD-10-CM

## 2015-02-07 LAB — POCT GLYCOSYLATED HEMOGLOBIN (HGB A1C): Hemoglobin A1C: 7.5

## 2015-02-07 MED ORDER — HYDROCHLOROTHIAZIDE 12.5 MG PO CAPS: ORAL_CAPSULE | ORAL | Status: DC

## 2015-02-07 MED ORDER — FUROSEMIDE 20 MG PO TABS: ORAL_TABLET | ORAL | Status: DC

## 2015-02-07 MED ORDER — INSULIN DETEMIR 100 UNIT/ML FLEXPEN: 65.0000 [IU] | PEN_INJECTOR | Freq: Two times a day (BID) | SUBCUTANEOUS | Status: DC

## 2015-02-07 MED ORDER — POTASSIUM CHLORIDE CRYS ER 20 MEQ PO TBCR: EXTENDED_RELEASE_TABLET | ORAL | Status: DC

## 2015-02-07 MED ORDER — AZELASTINE HCL 0.1 % NA SOLN: 1.0000 | Freq: Two times a day (BID) | NASAL | Status: DC | PRN

## 2015-02-07 MED ORDER — BUPROPION HCL ER (XL) 150 MG PO TB24: ORAL_TABLET | ORAL | Status: DC

## 2015-02-07 MED ORDER — LISDEXAMFETAMINE DIMESYLATE 70 MG PO CAPS: 70.0000 mg | ORAL_CAPSULE | ORAL | Status: DC

## 2015-02-07 MED ORDER — MELOXICAM 15 MG PO TABS: 15.0000 mg | ORAL_TABLET | Freq: Every day | ORAL | Status: DC

## 2015-02-07 MED ORDER — DAPSONE 5 % EX GEL: 1.0000 "application " | Freq: Two times a day (BID) | CUTANEOUS | Status: DC

## 2015-02-07 MED ORDER — INSULIN PEN NEEDLE 31G X 5 MM MISC: Status: DC

## 2015-02-07 MED ORDER — AMBULATORY NON FORMULARY MEDICATION: Status: DC

## 2015-02-07 MED ORDER — TRAMADOL HCL 50 MG PO TABS: ORAL_TABLET | ORAL | Status: DC

## 2015-02-07 MED ORDER — TRIAMCINOLONE ACETONIDE 0.1 % EX CREA: 1.0000 "application " | TOPICAL_CREAM | Freq: Two times a day (BID) | CUTANEOUS | Status: DC

## 2015-02-07 MED ORDER — AMITRIPTYLINE HCL 10 MG PO TABS: ORAL_TABLET | ORAL | Status: DC

## 2015-02-07 MED ORDER — ZOLPIDEM TARTRATE ER 12.5 MG PO TBCR: EXTENDED_RELEASE_TABLET | ORAL | Status: DC

## 2015-02-07 MED ORDER — INSULIN LISPRO 100 UNIT/ML (KWIKPEN): PEN_INJECTOR | SUBCUTANEOUS | Status: DC

## 2015-02-07 MED ORDER — GLUCAGON (RDNA) 1 MG IJ KIT: PACK | INTRAMUSCULAR | Status: DC

## 2015-02-07 MED ORDER — FLUCONAZOLE 150 MG PO TABS: 150.0000 mg | ORAL_TABLET | Freq: Once | ORAL | Status: DC

## 2015-02-07 MED ORDER — ALPRAZOLAM 1 MG PO TABS: ORAL_TABLET | ORAL | Status: DC

## 2015-02-07 NOTE — Progress Notes (Signed)
   Subjective:    Patient ID: Melissa [name], female    DOB: 01-08-1971, 44 y.o.   MRN: [id number]  HPI Patient is a 44 year old female who presents to the clinic for 3 month follow-up. She needs refills on all of her medications. She is doing well and controlled on medications.   Diabetes type 1, controlled- checking sugars and ranging in the am 90-120. After meals will peak around 150-180. Pt has significant neuropathy of both feet. Just finished abx for left leg cellulitis. Took 2 rounds of abx. She has some hypoglycemic events and has glucagon for those. signifcant vision changes. Had episode in [place] and went to eye doctor with acute eye bleed. She is going back to [facility] for treatment she liked his treatment plan.      Review of Systems  All other systems reviewed and are negative.      Objective:   Physical Exam  Constitutional: She is oriented to person, place, and time. She appears well-developed and well-nourished.  HENT:  Head: Normocephalic and atraumatic.  Cardiovascular: Normal rate, regular rhythm and normal heart sounds.   Pulmonary/Chest: Effort normal and breath sounds normal. She has no wheezes.  Musculoskeletal:  In a cam boot of left leg.   Neurological: She is alert and oriented to person, place, and time.  Psychiatric: She has a normal mood and affect. Her behavior is normal.          Assessment & Plan:  DM type I, controlled- .[name]. Lab Results  Component Value Date   HGBA1C 7.5 02/07/2015   Doing fair up a bit from last check but has been on a lot of abx for cellulitis.  Continue sliding scale and basal.  significant neuropathy, bilateral.  BMP ordered.  Diflucan given for hx of yeast infections.   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    ADHD- refilled vyvanse for 3 months.   HTN- need to be on ace noticed after pt left. Sent nurse msg to call pt. Continue on HCTZ only for now.   Mood disorder-  lamictal and wellbutrin refilled.   Chronic insomnia- Ambien refilled.   Adult acne- dapsone refilled.   Medication management- will recheck BMP after starting potassium with lasix.

## 2015-02-12 DIAGNOSIS — L03119 Cellulitis of unspecified part of limb: Secondary | ICD-10-CM | POA: Insufficient documentation

## 2015-02-12 DIAGNOSIS — K089 Disorder of teeth and supporting structures, unspecified: Secondary | ICD-10-CM | POA: Insufficient documentation

## 2015-02-12 DIAGNOSIS — M79671 Pain in right foot: Secondary | ICD-10-CM | POA: Insufficient documentation

## 2015-02-13 ENCOUNTER — Telehealth: Payer: Self-pay | Admitting: Physician Assistant

## 2015-02-13 ENCOUNTER — Encounter: Payer: Self-pay | Admitting: Physician Assistant

## 2015-02-13 DIAGNOSIS — M79672 Pain in left foot: Secondary | ICD-10-CM | POA: Insufficient documentation

## 2015-02-13 DIAGNOSIS — M5136 Other intervertebral disc degeneration, lumbar region: Secondary | ICD-10-CM | POA: Insufficient documentation

## 2015-02-13 NOTE — Telephone Encounter (Signed)
Looking through chart noticed not on a ACE inhibitor need to be with diabetes. Can we send in?

## 2015-02-17 ENCOUNTER — Other Ambulatory Visit: Payer: Self-pay | Admitting: Family Medicine

## 2015-04-03 ENCOUNTER — Ambulatory Visit: Payer: Self-pay | Admitting: Physician Assistant

## 2015-06-01 ENCOUNTER — Other Ambulatory Visit: Payer: Self-pay | Admitting: Family Medicine

## 2015-06-07 ENCOUNTER — Other Ambulatory Visit: Payer: Self-pay | Admitting: *Deleted

## 2015-06-07 DIAGNOSIS — F419 Anxiety disorder, unspecified: Secondary | ICD-10-CM

## 2015-06-07 MED ORDER — ALPRAZOLAM 1 MG PO TABS: ORAL_TABLET | ORAL | Status: DC

## 2015-07-11 ENCOUNTER — Ambulatory Visit (INDEPENDENT_AMBULATORY_CARE_PROVIDER_SITE_OTHER): Payer: BLUE CROSS/BLUE SHIELD | Admitting: Physician Assistant

## 2015-07-11 ENCOUNTER — Encounter: Payer: Self-pay | Admitting: Physician Assistant

## 2015-07-11 VITALS — BP 140/75 | HR 90 | Wt 158.0 lb

## 2015-07-11 DIAGNOSIS — M7989 Other specified soft tissue disorders: Secondary | ICD-10-CM

## 2015-07-11 DIAGNOSIS — E1039 Type 1 diabetes mellitus with other diabetic ophthalmic complication: Secondary | ICD-10-CM

## 2015-07-11 DIAGNOSIS — H269 Unspecified cataract: Secondary | ICD-10-CM

## 2015-07-11 DIAGNOSIS — F900 Attention-deficit hyperactivity disorder, predominantly inattentive type: Secondary | ICD-10-CM | POA: Diagnosis not present

## 2015-07-11 DIAGNOSIS — E1043 Type 1 diabetes mellitus with diabetic autonomic (poly)neuropathy: Secondary | ICD-10-CM

## 2015-07-11 DIAGNOSIS — G4700 Insomnia, unspecified: Secondary | ICD-10-CM

## 2015-07-11 DIAGNOSIS — F909 Attention-deficit hyperactivity disorder, unspecified type: Secondary | ICD-10-CM

## 2015-07-11 DIAGNOSIS — E103593 Type 1 diabetes mellitus with proliferative diabetic retinopathy without macular edema, bilateral: Secondary | ICD-10-CM | POA: Diagnosis not present

## 2015-07-11 DIAGNOSIS — H349 Unspecified retinal vascular occlusion: Secondary | ICD-10-CM

## 2015-07-11 LAB — POCT GLYCOSYLATED HEMOGLOBIN (HGB A1C): Hemoglobin A1C: 7.7

## 2015-07-11 MED ORDER — AMITRIPTYLINE HCL 10 MG PO TABS: ORAL_TABLET | ORAL | Status: DC

## 2015-07-11 MED ORDER — INSULIN LISPRO 100 UNIT/ML (KWIKPEN): PEN_INJECTOR | SUBCUTANEOUS | Status: DC

## 2015-07-11 MED ORDER — LISDEXAMFETAMINE DIMESYLATE 70 MG PO CAPS: 70.0000 mg | ORAL_CAPSULE | ORAL | Status: DC

## 2015-07-11 MED ORDER — ZOLPIDEM TARTRATE ER 12.5 MG PO TBCR: EXTENDED_RELEASE_TABLET | ORAL | Status: DC

## 2015-07-11 MED ORDER — INSULIN DETEMIR 100 UNIT/ML FLEXPEN: 65.0000 [IU] | PEN_INJECTOR | Freq: Two times a day (BID) | SUBCUTANEOUS | Status: DC

## 2015-07-11 MED ORDER — BUPROPION HCL ER (XL) 150 MG PO TB24: ORAL_TABLET | ORAL | Status: DC

## 2015-07-11 MED ORDER — LAMOTRIGINE 150 MG PO TABS: 150.0000 mg | ORAL_TABLET | Freq: Every day | ORAL | Status: DC

## 2015-07-11 MED ORDER — HYDROCHLOROTHIAZIDE 12.5 MG PO CAPS: 25.0000 mg | ORAL_CAPSULE | Freq: Every day | ORAL | Status: DC

## 2015-07-11 NOTE — Progress Notes (Signed)
   Subjective:    Patient ID: Alaiyah [place], female    DOB: January 19, 1971, 45 y.o.   MRN: [id number]  HPI Pt presents to the clinic for DM follow up.   She has type one. On levemir and humalog. Checking glucose multiple times a day ranging 120-180's fasting and 180's before meals. She has multiple complications for diabetes. She is seeing a retinal specialist in [place] for her retinopathy/cataracts. She recently had a YAG procedure done. Her vision is improving.   She needs refills on ambiein, elavil, wellbutin and HCTZ. She is sleeping ok. Her mood is stable. Her lower leg edema controlled with HCTZ.   She also needs ADHD refilled. Doing well on vyvanse.   Review of Systems  All other systems reviewed and are negative.      Objective:   Physical Exam  Constitutional: She is oriented to person, place, and time. She appears well-developed and well-nourished.  HENT:  Head: Normocephalic and atraumatic.  Cardiovascular: Normal rate, regular rhythm and normal heart sounds.   Pulmonary/Chest: Effort normal and breath sounds normal. She has no wheezes.  Neurological: She is alert and oriented to person, place, and time.  Psychiatric: She has a normal mood and affect. Her behavior is normal.          Assessment & Plan:  DM type I- .[name]. Results for orders placed or performed in visit on 07/11/15  POCT HgB A1C  Result Value Ref Range   Hemoglobin A1C 7.7    Under 8 but goal under 7. Has had a course or 2 of prednisone which causes elevation. She is using sliding scale and long acting.  Continue to follow retinal specialist.    Insomnia- refilled ambien and elavil.   Anxiety/depression- refilled wellbutrin.   Lower leg edema- refilled HCTZ.   ADHD- refilled vyvanse.

## 2015-07-11 NOTE — Patient Instructions (Signed)
[phone] ATTENTION AMBER BENDER

## 2015-07-13 DIAGNOSIS — M7989 Other specified soft tissue disorders: Secondary | ICD-10-CM | POA: Insufficient documentation

## 2015-07-25 ENCOUNTER — Telehealth: Payer: Self-pay | Admitting: Physician Assistant

## 2015-07-25 NOTE — Telephone Encounter (Signed)
LMOM requesting call back regarding labs & a statin.

## 2015-07-25 NOTE — Telephone Encounter (Signed)
Call pt: need a recent lipid panel. With DM increases risk of Cardiovascular risk. Needs to be on statin. Can we go ahead and put on low dose?

## 2015-08-07 ENCOUNTER — Telehealth: Payer: Self-pay | Admitting: *Deleted

## 2015-08-07 NOTE — Telephone Encounter (Signed)
PA form was faxed on 07/27/2015. Checked on the status and medication approved per automated message( [id number]( [id number]) Case ID [id number]. Per automated service they will call or mail patient a letter

## 2015-08-31 ENCOUNTER — Other Ambulatory Visit: Payer: Self-pay | Admitting: Physician Assistant

## 2015-10-02 LAB — HM DIABETES EYE EXAM

## 2015-10-09 ENCOUNTER — Ambulatory Visit: Payer: Self-pay | Admitting: Physician Assistant

## 2015-11-06 ENCOUNTER — Ambulatory Visit: Payer: Self-pay | Admitting: Physician Assistant

## 2015-11-13 ENCOUNTER — Encounter: Payer: Self-pay | Admitting: Physician Assistant

## 2015-11-13 ENCOUNTER — Ambulatory Visit (INDEPENDENT_AMBULATORY_CARE_PROVIDER_SITE_OTHER): Payer: BLUE CROSS/BLUE SHIELD | Admitting: Physician Assistant

## 2015-11-13 VITALS — BP 165/92 | HR 108 | Ht 64.0 in | Wt 187.0 lb

## 2015-11-13 DIAGNOSIS — R635 Abnormal weight gain: Secondary | ICD-10-CM

## 2015-11-13 DIAGNOSIS — Z23 Encounter for immunization: Secondary | ICD-10-CM

## 2015-11-13 DIAGNOSIS — E669 Obesity, unspecified: Secondary | ICD-10-CM

## 2015-11-13 DIAGNOSIS — Z760 Encounter for issue of repeat prescription: Secondary | ICD-10-CM

## 2015-11-13 DIAGNOSIS — E103593 Type 1 diabetes mellitus with proliferative diabetic retinopathy without macular edema, bilateral: Secondary | ICD-10-CM

## 2015-11-13 DIAGNOSIS — E1043 Type 1 diabetes mellitus with diabetic autonomic (poly)neuropathy: Secondary | ICD-10-CM

## 2015-11-13 LAB — POCT GLYCOSYLATED HEMOGLOBIN (HGB A1C): HEMOGLOBIN A1C: 7.8

## 2015-11-13 MED ORDER — TRAMADOL HCL 50 MG PO TABS: ORAL_TABLET | ORAL | 1 refills | Status: DC

## 2015-11-13 MED ORDER — HYDROCHLOROTHIAZIDE 12.5 MG PO CAPS: 25.0000 mg | ORAL_CAPSULE | Freq: Every day | ORAL | 1 refills | Status: DC

## 2015-11-13 MED ORDER — LISDEXAMFETAMINE DIMESYLATE 70 MG PO CAPS: 70.0000 mg | ORAL_CAPSULE | ORAL | 0 refills | Status: DC

## 2015-11-13 MED ORDER — INSULIN LISPRO 100 UNIT/ML (KWIKPEN): PEN_INJECTOR | SUBCUTANEOUS | 3 refills | Status: DC

## 2015-11-13 MED ORDER — TRIAMCINOLONE ACETONIDE 0.1 % EX CREA: 1.0000 "application " | TOPICAL_CREAM | Freq: Two times a day (BID) | CUTANEOUS | 1 refills | Status: DC

## 2015-11-13 MED ORDER — ZOLPIDEM TARTRATE ER 12.5 MG PO TBCR: EXTENDED_RELEASE_TABLET | ORAL | 1 refills | Status: DC

## 2015-11-13 MED ORDER — LORCASERIN HCL 10 MG PO TABS: 1.0000 | ORAL_TABLET | Freq: Two times a day (BID) | ORAL | 0 refills | Status: DC

## 2015-11-13 MED ORDER — GLUCAGON (RDNA) 1 MG IJ KIT: PACK | INTRAMUSCULAR | 1 refills | Status: DC

## 2015-11-13 MED ORDER — FLUCONAZOLE 150 MG PO TABS: 150.0000 mg | ORAL_TABLET | Freq: Once | ORAL | 1 refills | Status: AC

## 2015-11-13 MED ORDER — LAMOTRIGINE 150 MG PO TABS: 150.0000 mg | ORAL_TABLET | Freq: Every day | ORAL | 2 refills | Status: DC

## 2015-11-13 MED ORDER — MELOXICAM 15 MG PO TABS: 15.0000 mg | ORAL_TABLET | Freq: Every day | ORAL | 2 refills | Status: DC

## 2015-11-13 MED ORDER — AMITRIPTYLINE HCL 10 MG PO TABS: ORAL_TABLET | ORAL | 2 refills | Status: DC

## 2015-11-13 MED ORDER — INSULIN DETEMIR 100 UNIT/ML FLEXPEN: 65.0000 [IU] | PEN_INJECTOR | Freq: Two times a day (BID) | SUBCUTANEOUS | 3 refills | Status: DC

## 2015-11-13 MED ORDER — DAPSONE 5 % EX GEL
1.0000 | Freq: Two times a day (BID) | CUTANEOUS | 2 refills | Status: DC
Start: 2015-11-13 — End: 2016-01-22

## 2015-11-13 NOTE — Progress Notes (Addendum)
Subjective:     Patient ID: Melissa [name], female   DOB: 04/26/1970, 45 y.o.   MRN: [id number]  HPI Patient is a 45 y.o. Caucasian female presenting today for routine management of her diabetes. Patient states that she currently feels well controlled on her diabetic medications and requests refills. She has not had any hypoglycemic events in 3 months but has glucagon. denies any open sores or wounds.   Patient reports that she recently had a dental procedure in July 2017 which she acquired aspiration pneumonia from. Patient states that she was treated with prednisone, IM antibiotics, inhaler, and tesalon perles. Patient notes that her symptoms have almost resolved but she does have a residual cough.  Patient is requesting a refill on all of her medication including the tesalon perles. Patient notes that she would also like to try something for weight loss. Patient denies chest pain, shortness of breath, fever, or palpitations.  Review of Systems  Constitutional: Negative for activity change, appetite change, chills, diaphoresis, fatigue, fever and unexpected weight change.  HENT: Positive for voice change. Negative for congestion, ear discharge, ear pain, postnasal drip, rhinorrhea, sinus pressure, sneezing and sore throat.   Eyes: Negative.   Respiratory: Negative for cough, chest tightness, shortness of breath and wheezing.   Cardiovascular: Negative for chest pain, palpitations and leg swelling.  Gastrointestinal: Negative.   Genitourinary: Negative.   Neurological: Positive for numbness. Negative for dizziness, weakness, light-headedness and headaches.  Psychiatric/Behavioral: Negative.       Objective:   Physical Exam  Constitutional: She is oriented to person, place, and time. She appears well-developed and well-nourished. No distress.  HENT:  Head: Normocephalic and atraumatic.  Right Ear: External ear normal.  Left Ear: External ear normal.  Nose: Nose normal.  Mouth/Throat:  Oropharynx is clear and moist. No oropharyngeal exudate.  Eyes: Conjunctivae and EOM are normal. Pupils are equal, round, and reactive to light. Right eye exhibits no discharge. Left eye exhibits no discharge. No scleral icterus.  Neck: Normal range of motion. Neck supple. No JVD present. No tracheal deviation present. No thyromegaly present.  Cardiovascular: Normal rate, regular rhythm, normal heart sounds and intact distal pulses.  Exam reveals no gallop and no friction rub.   No murmur heard. Pulmonary/Chest: Breath sounds normal. No stridor. No respiratory distress. She has no wheezes. She has no rales. She exhibits no tenderness.  Abdominal: Soft. Bowel sounds are normal. She exhibits no distension and no mass. There is no tenderness. There is no rebound and no guarding.  Musculoskeletal:  Patient with left calf scarring from previous surgery.  Lymphadenopathy:    She has no cervical adenopathy.  Neurological: She is alert and oriented to person, place, and time. No cranial nerve deficit. Coordination normal.  Skin: Skin is warm and dry. No rash noted. She is not diaphoretic. No erythema. No pallor.  Psychiatric: She has a normal mood and affect. Her behavior is normal. Judgment and thought content normal.      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 -     fluconazole (DIFLUCAN) 150 MG tablet; Take 1 tablet (150 mg total) by  mouth once. -     glucagon 1 MG injection; Follow package directions for low blood sugar. -     hydrochlorothiazide (MICROZIDE) 12.5 MG capsule; Take 2 capsules (25 mg total) by mouth daily. -     Insulin Detemir (LEVEMIR FLEXTOUCH) 100 UNIT/ML Pen; Inject 65-70 Units into the skin 2 (two) times daily. -     insulin lispro (HUMALOG) 100 UNIT/ML KiwkPen; Use sliding scale at meal time. -     lamoTRIgine (LAMICTAL) 150 MG tablet; Take 1 tablet (150 mg total) by mouth daily. -     meloxicam (MOBIC) 15 MG tablet; Take 1 tablet (15 mg total) by mouth daily. -     traMADol (ULTRAM) 50 MG tablet; 1-2 tabs by mouth Q8 hours, maximum 6 tabs per day. -     triamcinolone cream (KENALOG) 0.1 %; Apply 1 application topically 2 (two) times daily. -     zolpidem (AMBIEN CR) 12.5 MG CR tablet; TAKE 1 TABLET BY MOUTH AT BEDTIME -     lisdexamfetamine (VYVANSE) 70 MG capsule; Take 1 capsule (70 mg total) by mouth every morning.  Abnormal weight gain -     Lorcaserin HCl 10 MG TABS; Take 1 tablet by mouth 2 (two) times daily. -     Lorcaserin HCl 10 MG TABS; Take 1 tablet by mouth 2 (two) times daily.  Obesity -     Lorcaserin HCl 10 MG TABS; Take 1 tablet by mouth 2 (two) times daily. -     Lorcaserin HCl 10 MG TABS; Take 1 tablet by mouth 2 (two) times daily.  Other orders -     Cancel: lisdexamfetamine (VYVANSE) 70 MG capsule; Take 1 capsule (70 mg total) by mouth every morning.   Plan:     1. 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 HgbA1C was done in clinic today and was 7.8%. This is a slight increase from the patients previous readings. However, considering the patients recent use of steroids secondary to pneumonia this is not concerning. Will recheck HbA1C in 3 months.     2. Abnormal weight gain/obesity - Patient to continue on Vyvanse 70mg  tablets. Patient to start on Lorcaserin 10mg  tablets daily.  Patient to follow-up in 2 months for routine monitoring and weight check.   3. Medication refills - Patient was given mediation refills. Patient to continue on current medication regimen at this time. Will continue to monitor.   Summary - Patient was given flu-shot today in clinic. Patient to follow-up in 2 months for weight check.

## 2015-11-20 ENCOUNTER — Telehealth: Payer: Self-pay

## 2015-11-20 NOTE — Telephone Encounter (Signed)
Pt insurance will not cover dapsone 5% if pt has not tried erythromycin 2% gel, clindamycin 1% gel, and clindamycin/ benz peroxide 1% / 5% gel. Please advise.

## 2015-11-20 NOTE — Telephone Encounter (Signed)
Please call the patient and confirm she has or has not tried some of the other agents. I think she has tried a lot of things.

## 2015-11-20 NOTE — Telephone Encounter (Signed)
Left vm for pt to return call to clinic in regards to medication

## 2015-11-22 ENCOUNTER — Other Ambulatory Visit: Payer: Self-pay | Admitting: *Deleted

## 2015-11-22 DIAGNOSIS — F419 Anxiety disorder, unspecified: Secondary | ICD-10-CM

## 2015-11-22 MED ORDER — ALPRAZOLAM 1 MG PO TABS: ORAL_TABLET | ORAL | 0 refills | Status: DC

## 2016-01-09 ENCOUNTER — Other Ambulatory Visit: Payer: Self-pay

## 2016-01-09 MED ORDER — BUPROPION HCL ER (XL) 150 MG PO TB24: 450.0000 mg | ORAL_TABLET | Freq: Every day | ORAL | 0 refills | Status: DC

## 2016-01-22 ENCOUNTER — Encounter: Payer: Self-pay | Admitting: Physician Assistant

## 2016-01-22 ENCOUNTER — Ambulatory Visit (INDEPENDENT_AMBULATORY_CARE_PROVIDER_SITE_OTHER): Payer: BLUE CROSS/BLUE SHIELD | Admitting: Physician Assistant

## 2016-01-22 VITALS — BP 133/75 | HR 93 | Ht 64.0 in | Wt 173.0 lb

## 2016-01-22 DIAGNOSIS — F411 Generalized anxiety disorder: Secondary | ICD-10-CM

## 2016-01-22 DIAGNOSIS — E1043 Type 1 diabetes mellitus with diabetic autonomic (poly)neuropathy: Secondary | ICD-10-CM | POA: Diagnosis not present

## 2016-01-22 DIAGNOSIS — H349 Unspecified retinal vascular occlusion: Secondary | ICD-10-CM

## 2016-01-22 DIAGNOSIS — E103593 Type 1 diabetes mellitus with proliferative diabetic retinopathy without macular edema, bilateral: Secondary | ICD-10-CM

## 2016-01-22 DIAGNOSIS — R635 Abnormal weight gain: Secondary | ICD-10-CM

## 2016-01-22 DIAGNOSIS — F331 Major depressive disorder, recurrent, moderate: Secondary | ICD-10-CM

## 2016-01-22 DIAGNOSIS — E1039 Type 1 diabetes mellitus with other diabetic ophthalmic complication: Secondary | ICD-10-CM | POA: Diagnosis not present

## 2016-01-22 DIAGNOSIS — L700 Acne vulgaris: Secondary | ICD-10-CM

## 2016-01-22 DIAGNOSIS — F39 Unspecified mood [affective] disorder: Secondary | ICD-10-CM

## 2016-01-22 MED ORDER — BUPROPION HCL ER (XL) 300 MG PO TB24: 300.0000 mg | ORAL_TABLET | Freq: Every day | ORAL | 1 refills | Status: DC

## 2016-01-22 MED ORDER — CLINDAMYCIN PHOSPHATE 1 % EX GEL
Freq: Two times a day (BID) | CUTANEOUS | 1 refills | Status: DC
Start: 2016-01-22 — End: 2017-02-10

## 2016-01-22 MED ORDER — LORCASERIN HCL 10 MG PO TABS: 1.0000 | ORAL_TABLET | Freq: Two times a day (BID) | ORAL | 0 refills | Status: DC

## 2016-01-22 MED ORDER — BUPROPION HCL ER (XL) 150 MG PO TB24: 150.0000 mg | ORAL_TABLET | Freq: Every day | ORAL | 1 refills | Status: DC

## 2016-01-22 NOTE — Progress Notes (Signed)
   Subjective:    Patient ID: Melissa [name], female    DOB: August 26, 1970, 45 y.o.   MRN: [id number]  HPI  Pt is a 45 yo female who presents to the clinic for follow up.   DM- she is checking her sugars and rarely has sugars over 110. Continues to have balance issues from neuropathy. She will see Dr. Romualdo Bolkial today. She is actively losing weight on belviq. Down from 187-173. She has had one or two hypoglycemic events resolved with eating. Sugars have been better since weight loss.   Mood is well controlled and request refill.   Insurance will not pay for dapsone. She would like to try something else.    Review of Systems  All other systems reviewed and are negative.      Objective:   Physical Exam  Constitutional: She is oriented to person, place, and time. She appears well-developed and well-nourished.  HENT:  Head: Normocephalic and atraumatic.  Cardiovascular: Normal rate, regular rhythm and normal heart sounds.   Pulmonary/Chest: Effort normal and breath sounds normal.  Neurological: She is alert and oriented to person, place, and time.  Psychiatric: She has a normal mood and affect. Her behavior is normal.          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 -     buPROPion (WELLBUTRIN XL) 150 MG 24 hr tablet; Take 1 tablet (150 mg total) by mouth daily.  MDD (major depressive disorder), recurrent episode, moderate (HCC) -     buPROPion (WELLBUTRIN XL) 300 MG 24 hr tablet; Take 1 tablet (300 mg total)  by mouth daily. -     buPROPion (WELLBUTRIN XL) 150 MG 24 hr tablet; Take 1 tablet (150 mg total) by mouth daily.  Mood disorder (HCC) -     buPROPion (WELLBUTRIN XL) 300 MG 24 hr tablet; Take 1 tablet (300 mg total) by mouth daily. -     buPROPion (WELLBUTRIN XL) 150 MG 24 hr tablet; Take 1 tablet (150 mg total) by mouth daily.  Acne vulgaris -     clindamycin (CLINDAGEL) 1 % gel; Apply topically 2 (two) times daily.   Too soon for A!C will get with upcoming blood work with Dr. Romualdo Bolkial.  Pt has opthalmology who follows her closely.

## 2016-01-30 ENCOUNTER — Telehealth: Payer: Self-pay | Admitting: Physician Assistant

## 2016-01-30 ENCOUNTER — Other Ambulatory Visit: Payer: Self-pay | Admitting: *Deleted

## 2016-01-30 DIAGNOSIS — Z760 Encounter for issue of repeat prescription: Secondary | ICD-10-CM

## 2016-01-30 MED ORDER — LISDEXAMFETAMINE DIMESYLATE 70 MG PO CAPS: 70.0000 mg | ORAL_CAPSULE | ORAL | 0 refills | Status: DC

## 2016-01-30 NOTE — Telephone Encounter (Signed)
Rx printed

## 2016-01-30 NOTE — Telephone Encounter (Signed)
Pt requesting Rx a little early. States [facility] and then [facility] fill it. Will route to approval.

## 2016-01-30 NOTE — Telephone Encounter (Signed)
Ok for refill. Please print 90 day for metheney to sign.

## 2016-02-05 ENCOUNTER — Ambulatory Visit: Payer: Self-pay | Admitting: Physician Assistant

## 2016-02-12 ENCOUNTER — Other Ambulatory Visit: Payer: Self-pay | Admitting: *Deleted

## 2016-02-12 ENCOUNTER — Other Ambulatory Visit: Payer: Self-pay

## 2016-02-12 DIAGNOSIS — R635 Abnormal weight gain: Secondary | ICD-10-CM

## 2016-02-12 DIAGNOSIS — F419 Anxiety disorder, unspecified: Secondary | ICD-10-CM

## 2016-02-12 MED ORDER — ALPRAZOLAM 1 MG PO TABS
ORAL_TABLET | ORAL | 0 refills | Status: DC
Start: 2016-02-12 — End: 2016-04-29

## 2016-02-12 MED ORDER — LORCASERIN HCL 10 MG PO TABS: 1.0000 | ORAL_TABLET | Freq: Two times a day (BID) | ORAL | 0 refills | Status: DC

## 2016-03-15 ENCOUNTER — Other Ambulatory Visit: Payer: Self-pay | Admitting: Physician Assistant

## 2016-04-08 ENCOUNTER — Encounter: Payer: Self-pay | Admitting: Physician Assistant

## 2016-04-09 ENCOUNTER — Other Ambulatory Visit: Payer: Self-pay

## 2016-04-09 DIAGNOSIS — R635 Abnormal weight gain: Secondary | ICD-10-CM

## 2016-04-09 MED ORDER — LORCASERIN HCL 10 MG PO TABS: 1.0000 | ORAL_TABLET | Freq: Two times a day (BID) | ORAL | 0 refills | Status: DC

## 2016-04-10 ENCOUNTER — Telehealth: Payer: Self-pay | Admitting: *Deleted

## 2016-04-10 NOTE — Telephone Encounter (Signed)
Pre authorization sent to cover my meds. Key: [id number].

## 2016-04-14 ENCOUNTER — Telehealth: Payer: Self-pay

## 2016-04-14 ENCOUNTER — Other Ambulatory Visit: Payer: Self-pay | Admitting: Physician Assistant

## 2016-04-14 DIAGNOSIS — Z760 Encounter for issue of repeat prescription: Secondary | ICD-10-CM

## 2016-04-14 DIAGNOSIS — E1043 Type 1 diabetes mellitus with diabetic autonomic (poly)neuropathy: Secondary | ICD-10-CM

## 2016-04-14 NOTE — Telephone Encounter (Signed)
Called [facility] pharmacy and they stated that they don't have it in 0.4 mg. LVM requesting pt to call the office.

## 2016-04-14 NOTE — Telephone Encounter (Signed)
I do not see that glucagon comes that way. If it does then ok with to be filled. Would you call pharmacy and see if comes that way.

## 2016-04-14 NOTE — Telephone Encounter (Signed)
Pt wants to know if you can change her Glucagon 1 mg to glucagon 0.4 mg x 2 per month for insurance purposes. Pt states every time she takes antibiotics it causes her blood sugar to drop really low and she is having to call the EMS for medication.

## 2016-04-15 ENCOUNTER — Other Ambulatory Visit: Payer: Self-pay | Admitting: Physician Assistant

## 2016-04-15 NOTE — Telephone Encounter (Signed)
Pt called and wants to know if she can get glucagon 1 mg x 10 per month?

## 2016-04-15 NOTE — Telephone Encounter (Signed)
How many hypoglycemic episodes is patient having? If she is having 10 a month then some decreasing of insulin really needs to happen. Is 10 a 90 day supply?

## 2016-04-15 NOTE — Telephone Encounter (Signed)
Attempted to contact Pt 3 times, went straight to VM. Left message to return clinic call regarding hypoglycemic events and how much insulin she is using. Callback information provided.

## 2016-04-16 MED ORDER — GLUCAGON (RDNA) 1 MG IJ KIT: PACK | INTRAMUSCULAR | 1 refills | Status: DC

## 2016-04-16 NOTE — Telephone Encounter (Signed)
[facility]

## 2016-04-16 NOTE — Telephone Encounter (Signed)
Pt contacted clinic again to inform PCP that she took a home A1c test in January of 2018, the result was 6.8. Unsure of the reliability of a home kit. Will route.

## 2016-04-16 NOTE — Telephone Encounter (Signed)
Rx sent Pt advised 

## 2016-04-16 NOTE — Telephone Encounter (Signed)
Spoke with Pt, she states the current antibiotics are causing her blood sugar to drop mostly at night. Pt reports she is currently taking 40 units of Levemir BID, and rarely using her Humalog. When she does use her Humalog she is injecting anywhere from 1-4 units. Pt is having her oral surgery for tooth abscess on March 1, so she should be off antibiotics after that is cleared up.   Pt is requesting an Rx [facility] the [facility] Pt request Rx for quantity 10 pens, which will be a 90 day supply.  Pt's best contact phone number while she is in [place] is: [phone].

## 2016-04-21 NOTE — Telephone Encounter (Signed)
Patient has called the clinic multiple times in regard to the status of Belviq. Her husband today has left two messages on my voicemail inquiring about the status. Checked Key # in covermymeds and it looks as if the PA is still pending. I have received no other forms to fill our or no other communication from [employer] insurance company. Called and spoke with patient's spouse and let him know that at this point the PA is pending. I advised that it can take up to two weeks for insurance to process. As of now it has been 7 business days. Husband voiced understanding.

## 2016-04-23 ENCOUNTER — Encounter: Payer: Self-pay | Admitting: *Deleted

## 2016-04-23 NOTE — Telephone Encounter (Signed)
belviq was denied because insurance states patient didn't lose 5 % of her body weight. Actually from September to November the patient did lose more than 5% of body weight. Submitted appeal

## 2016-04-28 ENCOUNTER — Other Ambulatory Visit: Payer: Self-pay | Admitting: Physician Assistant

## 2016-04-28 DIAGNOSIS — Z760 Encounter for issue of repeat prescription: Secondary | ICD-10-CM

## 2016-04-29 ENCOUNTER — Other Ambulatory Visit: Payer: Self-pay | Admitting: *Deleted

## 2016-04-29 DIAGNOSIS — Z760 Encounter for issue of repeat prescription: Secondary | ICD-10-CM

## 2016-04-29 DIAGNOSIS — F419 Anxiety disorder, unspecified: Secondary | ICD-10-CM

## 2016-04-29 MED ORDER — ZOLPIDEM TARTRATE ER 12.5 MG PO TBCR: EXTENDED_RELEASE_TABLET | ORAL | 1 refills | Status: DC

## 2016-04-29 MED ORDER — ALPRAZOLAM 1 MG PO TABS: ORAL_TABLET | ORAL | 0 refills | Status: DC

## 2016-05-20 ENCOUNTER — Ambulatory Visit (INDEPENDENT_AMBULATORY_CARE_PROVIDER_SITE_OTHER): Payer: BLUE CROSS/BLUE SHIELD | Admitting: Physician Assistant

## 2016-05-20 ENCOUNTER — Encounter: Payer: Self-pay | Admitting: Physician Assistant

## 2016-05-20 VITALS — BP 135/78 | HR 101 | Ht 64.0 in | Wt 173.0 lb

## 2016-05-20 DIAGNOSIS — E1043 Type 1 diabetes mellitus with diabetic autonomic (poly)neuropathy: Secondary | ICD-10-CM | POA: Diagnosis not present

## 2016-05-20 DIAGNOSIS — E6609 Other obesity due to excess calories: Secondary | ICD-10-CM | POA: Diagnosis not present

## 2016-05-20 DIAGNOSIS — F909 Attention-deficit hyperactivity disorder, unspecified type: Secondary | ICD-10-CM

## 2016-05-20 DIAGNOSIS — Z760 Encounter for issue of repeat prescription: Secondary | ICD-10-CM | POA: Diagnosis not present

## 2016-05-20 DIAGNOSIS — J301 Allergic rhinitis due to pollen: Secondary | ICD-10-CM

## 2016-05-20 DIAGNOSIS — E7800 Pure hypercholesterolemia, unspecified: Secondary | ICD-10-CM | POA: Diagnosis not present

## 2016-05-20 DIAGNOSIS — I10 Essential (primary) hypertension: Secondary | ICD-10-CM | POA: Diagnosis not present

## 2016-05-20 LAB — POCT GLYCOSYLATED HEMOGLOBIN (HGB A1C): Hemoglobin A1C: 7.4

## 2016-05-20 MED ORDER — LISDEXAMFETAMINE DIMESYLATE 70 MG PO CAPS: 70.0000 mg | ORAL_CAPSULE | ORAL | 0 refills | Status: DC

## 2016-05-20 MED ORDER — LORCASERIN HCL ER 20 MG PO TB24: 1.0000 | ORAL_TABLET | Freq: Every day | ORAL | 0 refills | Status: DC

## 2016-05-20 MED ORDER — ATORVASTATIN CALCIUM 40 MG PO TABS
40.0000 mg | ORAL_TABLET | Freq: Every day | ORAL | 3 refills | Status: DC
Start: 2016-05-20 — End: 2016-09-11

## 2016-05-20 MED ORDER — LORATADINE-PSEUDOEPHEDRINE ER 10-240 MG PO TB24: 1.0000 | ORAL_TABLET | Freq: Every day | ORAL | 1 refills | Status: DC | PRN

## 2016-05-20 MED ORDER — LORCASERIN HCL ER 20 MG PO TB24: 1.0000 | ORAL_TABLET | Freq: Every day | ORAL | 5 refills | Status: DC

## 2016-05-20 NOTE — Progress Notes (Signed)
   Subjective:    Patient ID: Melissa [name], female    DOB: [birth year], 46 y.o.   MRN: [id number]  HPI  Pt is a 46 yo female who presents to the clinic for 3 month follow up.   ADHD- doing great. No problems or concerns.   DM- checking sugars and using long acting and short acting insulin and metformin. She often has hypoglycemia and has glucagon for this. No open sores or wounds. She is being managed for vision changes.   Labs: reported by mouth CR 1.7 TG 280 totalcholesterol 240.    Review of Systems  All other systems reviewed and are negative.      Objective:   Physical Exam  Constitutional: She is oriented to person, place, and time. She appears well-developed and well-nourished.  HENT:  Head: Normocephalic and atraumatic.  Cardiovascular: Normal rate, regular rhythm and normal heart sounds.   Pulmonary/Chest: Effort normal and breath sounds normal.  Neurological: She is alert and oriented to person, place, and time.  Psychiatric: She has a normal mood and affect. Her behavior is normal.          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  Acute seasonal allergic rhinitis due to pollen -     loratadine-pseudoephedrine (CLARITIN-D 24-HOUR) 10-240 MG 24 hr tablet; Take 1 tablet by mouth daily as needed for allergies.  Essential hypertension, benign  Class 1 obesity due to excess calories without serious comorbidity in adult, unspecified BMI -     Lorcaserin HCl ER (BELVIQ XR) 20 MG TB24; Take 1 tablet by mouth daily.  Adult ADHD -     lisdexamfetamine (VYVANSE) 70 MG capsule; Take 1 capsule (70 mg total) by mouth every morning.  Pure hypercholesterolemia -     atorvastatin (LIPITOR) 40 MG tablet; Take 1 tablet (40 mg total) by mouth  daily.  Other orders -     Discontinue: Lorcaserin HCl ER (BELVIQ XR) 20 MG TB24; Take 1 tablet by mouth daily. -     Discontinue: Lorcaserin HCl ER (BELVIQ XR) 20 MG TB24; Take 1 tablet by mouth daily.  .. Lab Results  Component Value Date   HGBA1C 7.4 05/20/2016   A!C stable.  Refilled medications.  Discuss labs from phone.  Dr. Romualdo Bolkial manages neuropathy.  Encouraged mammogram. Per [place] done in [place].  vyvanse refilled for 3 months.   Continue working on weight loss with belviq. No weight loss. Discussed cutting calories. Exercise is made difficult due to neurpathy.

## 2016-05-22 DIAGNOSIS — E7800 Pure hypercholesterolemia, unspecified: Secondary | ICD-10-CM | POA: Insufficient documentation

## 2016-05-27 ENCOUNTER — Telehealth: Payer: Self-pay | Admitting: *Deleted

## 2016-05-27 NOTE — Telephone Encounter (Signed)
Submitted  PA through covermymeds and response was: [place] Key: [email or web address] - Rx #: [id number] Need help? Call us at [phone]  Outcome    PA was already submitted for this patient and drug which was approved.;CaseId:[id number];Status:Approved;Coverage Start [id number];YQIHKVQQ End [id number];  [facility] and confirmed that this med had been approved and they mailed a letter to the patient

## 2016-07-12 ENCOUNTER — Other Ambulatory Visit: Payer: Self-pay | Admitting: Physician Assistant

## 2016-07-12 DIAGNOSIS — Z760 Encounter for issue of repeat prescription: Secondary | ICD-10-CM

## 2016-07-15 ENCOUNTER — Telehealth: Payer: Self-pay | Admitting: Physician Assistant

## 2016-07-15 NOTE — Telephone Encounter (Signed)
Received PA for Vyvanse sent through cover my meds and received authorization good from 06/15/16 - 07/15/17. I will notify the pharmacy - CF  Case ID [id number]

## 2016-08-21 ENCOUNTER — Ambulatory Visit (INDEPENDENT_AMBULATORY_CARE_PROVIDER_SITE_OTHER): Payer: BLUE CROSS/BLUE SHIELD | Admitting: Family Medicine

## 2016-08-21 ENCOUNTER — Encounter: Payer: Self-pay | Admitting: Family Medicine

## 2016-08-21 VITALS — BP 152/76 | HR 101 | Ht 64.0 in | Wt 182.0 lb

## 2016-08-21 DIAGNOSIS — E6609 Other obesity due to excess calories: Secondary | ICD-10-CM

## 2016-08-21 DIAGNOSIS — E1043 Type 1 diabetes mellitus with diabetic autonomic (poly)neuropathy: Secondary | ICD-10-CM | POA: Diagnosis not present

## 2016-08-21 DIAGNOSIS — F909 Attention-deficit hyperactivity disorder, unspecified type: Secondary | ICD-10-CM | POA: Diagnosis not present

## 2016-08-21 DIAGNOSIS — Z6831 Body mass index (BMI) 31.0-31.9, adult: Secondary | ICD-10-CM | POA: Diagnosis not present

## 2016-08-21 DIAGNOSIS — Z760 Encounter for issue of repeat prescription: Secondary | ICD-10-CM

## 2016-08-21 LAB — POCT GLYCOSYLATED HEMOGLOBIN (HGB A1C): HEMOGLOBIN A1C: 7.5

## 2016-08-21 MED ORDER — LORCASERIN HCL ER 20 MG PO TB24: 1.0000 | ORAL_TABLET | Freq: Every day | ORAL | 2 refills | Status: DC

## 2016-08-21 MED ORDER — LISDEXAMFETAMINE DIMESYLATE 70 MG PO CAPS: 70.0000 mg | ORAL_CAPSULE | ORAL | 0 refills | Status: DC

## 2016-08-21 NOTE — Progress Notes (Signed)
Subjective:    CC: DM, type 1, Reports recently had lab work Pap smear and mammogram all done in [place] in the last year.  HPI:  Diabetes - 2 mo f/u.  no hypoglycemic events. No wounds or sores that are not healing well. No increased thirst or urination. Checking glucose at home. Taking medications as prescribed without any side effects.Last A1c was 7.4. He reports her last eye exam was done in May at [facility], in [place]  F/U ADHD - doing well on her Vyvanse without any side effects or problems. No abnormal weight loss. No chest pain.  BMI 31 - She is currently on Belviq. She is up 8 pounds from when she was here 3 months ago. She is wanting to switch to Adipex-P. He feels like the medication hasn't been working for about the last 6 weeks but she's also been on continuous antibiotics during this time.[name]. 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 She has an appointment next week to start surgical procedure but they want her to be on the antibiotics to get the inflammation down. She is actually getting this done in [place]. She says she is really working hard on her diet and does a phone interview with the nutritionist regularly through her insurance company. She's been swimming for exercise. She is really cut out all sugars.   Past medical history, Surgical history, Family history not pertinant except as noted below, Social history, Allergies, and medications have been entered into the medical record, reviewed, and corrections made.   Review of Systems: No fevers, chills, night sweats, weight loss, chest pain, or shortness of breath.   Objective:    General: Well Developed, well nourished, and in no acute distress.  Neuro: Alert and oriented x3, extra-ocular muscles intact, sensation grossly intact.  HEENT: Normocephalic, atraumatic  Skin: Warm and dry, no rashes. Cardiac: Regular rate  and rhythm, no murmurs rubs or gallops, no lower extremity edema.  Respiratory: Clear to auscultation bilaterally. Not using accessory muscles, speaking in full sentences.   Impression and Recommendations:    Type 1 DM - She reports her sugars have been running a little higher while on antibiotics recently. Hemoglobin A1c of 7.5 today. Does increasing Humalog by couple of units especially on days where her blood sugars running a little higher especially while she will likely continue to be on antibiotic for at least another 1-2 weeks.  ADHD - 90 day refill completed today.  BMI 31 - For now we'll continue with Belviq. Her that she is not a candidate for apex/phentermine she is Artie on a stimulant. It could be that the antibiotics are interfering with the processing of the medication. She'll touch base with her nutritionist to see if maybe her calorie count needs to be revisited. In addition it could be that she is actually under eating.

## 2016-09-10 ENCOUNTER — Other Ambulatory Visit: Payer: Self-pay

## 2016-09-10 DIAGNOSIS — F419 Anxiety disorder, unspecified: Secondary | ICD-10-CM

## 2016-09-10 MED ORDER — ALPRAZOLAM 1 MG PO TABS: ORAL_TABLET | ORAL | 0 refills | Status: DC

## 2016-09-11 ENCOUNTER — Telehealth: Payer: Self-pay

## 2016-09-11 DIAGNOSIS — F331 Major depressive disorder, recurrent, moderate: Secondary | ICD-10-CM

## 2016-09-11 DIAGNOSIS — E7800 Pure hypercholesterolemia, unspecified: Secondary | ICD-10-CM

## 2016-09-11 DIAGNOSIS — Z760 Encounter for issue of repeat prescription: Secondary | ICD-10-CM

## 2016-09-11 DIAGNOSIS — F411 Generalized anxiety disorder: Secondary | ICD-10-CM

## 2016-09-11 DIAGNOSIS — F419 Anxiety disorder, unspecified: Secondary | ICD-10-CM

## 2016-09-11 DIAGNOSIS — F39 Unspecified mood [affective] disorder: Secondary | ICD-10-CM

## 2016-09-11 DIAGNOSIS — E1043 Type 1 diabetes mellitus with diabetic autonomic (poly)neuropathy: Secondary | ICD-10-CM

## 2016-09-11 MED ORDER — BUPROPION HCL ER (XL) 150 MG PO TB24: 150.0000 mg | ORAL_TABLET | Freq: Every day | ORAL | 0 refills | Status: DC

## 2016-09-11 MED ORDER — ALPRAZOLAM 1 MG PO TABS: ORAL_TABLET | ORAL | 0 refills | Status: DC

## 2016-09-11 MED ORDER — AMITRIPTYLINE HCL 10 MG PO TABS: ORAL_TABLET | ORAL | 0 refills | Status: DC

## 2016-09-11 MED ORDER — ZOLPIDEM TARTRATE ER 12.5 MG PO TBCR: EXTENDED_RELEASE_TABLET | ORAL | 0 refills | Status: DC

## 2016-09-11 MED ORDER — BUPROPION HCL ER (XL) 300 MG PO TB24: 300.0000 mg | ORAL_TABLET | Freq: Every day | ORAL | 0 refills | Status: DC

## 2016-09-11 MED ORDER — GLUCAGON (RDNA) 1 MG IJ KIT: PACK | INTRAMUSCULAR | 0 refills | Status: DC

## 2016-09-11 MED ORDER — ATORVASTATIN CALCIUM 40 MG PO TABS: 40.0000 mg | ORAL_TABLET | Freq: Every day | ORAL | 0 refills | Status: DC

## 2016-09-11 MED ORDER — INSULIN LISPRO 100 UNIT/ML (KWIKPEN): PEN_INJECTOR | SUBCUTANEOUS | 0 refills | Status: DC

## 2016-09-11 NOTE — Telephone Encounter (Signed)
No problem. All done

## 2016-09-11 NOTE — Telephone Encounter (Signed)
[name] left VM stating she usually gets her medications through mail order but due to delays she will not be able to get medications on time. Would like a short fill for approx 10-14 days because she's about to go out of town. Please advise. [name] does have elavil and zolpidem for sleep. Melissa [name].

## 2016-10-06 ENCOUNTER — Telehealth: Payer: Self-pay

## 2016-10-06 NOTE — Telephone Encounter (Signed)
We need labs for lasix. If having acute swelling can give 10 tablets for as needed. Go ahead and refill HCTZ for a month. Needs appt.

## 2016-10-06 NOTE — Telephone Encounter (Signed)
Pt called, is out of town and would like a refill on HCTZ and lasix.  Lasix has not been refilled since 2016, so I wanted to clarify if it is ok to send a 30 day supply.  Please advise.

## 2016-10-08 NOTE — Telephone Encounter (Signed)
Left message for a return call

## 2016-10-10 ENCOUNTER — Other Ambulatory Visit: Payer: Self-pay | Admitting: Physician Assistant

## 2016-10-10 DIAGNOSIS — Z760 Encounter for issue of repeat prescription: Secondary | ICD-10-CM

## 2016-10-14 ENCOUNTER — Other Ambulatory Visit: Payer: Self-pay

## 2016-10-14 DIAGNOSIS — Z760 Encounter for issue of repeat prescription: Secondary | ICD-10-CM

## 2016-10-14 MED ORDER — FUROSEMIDE 20 MG PO TABS: ORAL_TABLET | ORAL | 0 refills | Status: DC

## 2016-10-14 MED ORDER — HYDROCHLOROTHIAZIDE 12.5 MG PO CAPS: 25.0000 mg | ORAL_CAPSULE | Freq: Every day | ORAL | 0 refills | Status: DC

## 2016-10-30 ENCOUNTER — Telehealth: Payer: Self-pay

## 2016-10-30 NOTE — Telephone Encounter (Signed)
Pt called and stated that she has an appt on 11/18/16 to see Jade. Pt wants a refill on Wellbutrin xl 300mg  and hydrochlorothiazide 12.5 mg. Pt stated that she is going to run out before her appt. A different provider prescribed them last time please advise?

## 2016-10-31 NOTE — Telephone Encounter (Signed)
Ok to give one month 

## 2016-11-01 ENCOUNTER — Other Ambulatory Visit: Payer: Self-pay | Admitting: Physician Assistant

## 2016-11-01 DIAGNOSIS — Z760 Encounter for issue of repeat prescription: Secondary | ICD-10-CM

## 2016-11-03 ENCOUNTER — Other Ambulatory Visit: Payer: Self-pay | Admitting: *Deleted

## 2016-11-03 DIAGNOSIS — Z760 Encounter for issue of repeat prescription: Secondary | ICD-10-CM

## 2016-11-03 DIAGNOSIS — F39 Unspecified mood [affective] disorder: Secondary | ICD-10-CM

## 2016-11-03 DIAGNOSIS — F331 Major depressive disorder, recurrent, moderate: Secondary | ICD-10-CM

## 2016-11-03 DIAGNOSIS — F411 Generalized anxiety disorder: Secondary | ICD-10-CM

## 2016-11-03 MED ORDER — BUPROPION HCL ER (XL) 300 MG PO TB24: 300.0000 mg | ORAL_TABLET | Freq: Every day | ORAL | 0 refills | Status: DC

## 2016-11-03 MED ORDER — HYDROCHLOROTHIAZIDE 12.5 MG PO CAPS: 25.0000 mg | ORAL_CAPSULE | Freq: Every day | ORAL | 0 refills | Status: DC

## 2016-11-03 NOTE — Telephone Encounter (Signed)
sent 

## 2016-11-18 ENCOUNTER — Ambulatory Visit (INDEPENDENT_AMBULATORY_CARE_PROVIDER_SITE_OTHER): Payer: BLUE CROSS/BLUE SHIELD | Admitting: Physician Assistant

## 2016-11-18 ENCOUNTER — Encounter: Payer: Self-pay | Admitting: Physician Assistant

## 2016-11-18 VITALS — BP 145/89 | HR 97 | Ht 64.0 in | Wt 183.0 lb

## 2016-11-18 DIAGNOSIS — F909 Attention-deficit hyperactivity disorder, unspecified type: Secondary | ICD-10-CM | POA: Diagnosis not present

## 2016-11-18 DIAGNOSIS — F39 Unspecified mood [affective] disorder: Secondary | ICD-10-CM

## 2016-11-18 DIAGNOSIS — F419 Anxiety disorder, unspecified: Secondary | ICD-10-CM | POA: Diagnosis not present

## 2016-11-18 DIAGNOSIS — E1043 Type 1 diabetes mellitus with diabetic autonomic (poly)neuropathy: Secondary | ICD-10-CM

## 2016-11-18 DIAGNOSIS — Z6831 Body mass index (BMI) 31.0-31.9, adult: Secondary | ICD-10-CM | POA: Diagnosis not present

## 2016-11-18 DIAGNOSIS — F411 Generalized anxiety disorder: Secondary | ICD-10-CM | POA: Diagnosis not present

## 2016-11-18 DIAGNOSIS — Z760 Encounter for issue of repeat prescription: Secondary | ICD-10-CM | POA: Diagnosis not present

## 2016-11-18 DIAGNOSIS — Z23 Encounter for immunization: Secondary | ICD-10-CM

## 2016-11-18 DIAGNOSIS — R600 Localized edema: Secondary | ICD-10-CM | POA: Diagnosis not present

## 2016-11-18 DIAGNOSIS — F331 Major depressive disorder, recurrent, moderate: Secondary | ICD-10-CM | POA: Diagnosis not present

## 2016-11-18 DIAGNOSIS — E7800 Pure hypercholesterolemia, unspecified: Secondary | ICD-10-CM | POA: Diagnosis not present

## 2016-11-18 LAB — POCT GLYCOSYLATED HEMOGLOBIN (HGB A1C): HEMOGLOBIN A1C: 7.6

## 2016-11-18 MED ORDER — PHENTERMINE-TOPIRAMATE ER 3.75-23 MG PO CP24: 1.0000 | ORAL_CAPSULE | Freq: Every morning | ORAL | 0 refills | Status: DC

## 2016-11-18 MED ORDER — AMITRIPTYLINE HCL 10 MG PO TABS: ORAL_TABLET | ORAL | 1 refills | Status: DC

## 2016-11-18 MED ORDER — TRAMADOL HCL 50 MG PO TABS: ORAL_TABLET | ORAL | 1 refills | Status: DC

## 2016-11-18 MED ORDER — BUPROPION HCL ER (XL) 300 MG PO TB24: 300.0000 mg | ORAL_TABLET | Freq: Every day | ORAL | 0 refills | Status: DC

## 2016-11-18 MED ORDER — MELOXICAM 15 MG PO TABS: 15.0000 mg | ORAL_TABLET | Freq: Every day | ORAL | 2 refills | Status: DC

## 2016-11-18 MED ORDER — INSULIN LISPRO 100 UNIT/ML (KWIKPEN): PEN_INJECTOR | SUBCUTANEOUS | 0 refills | Status: DC

## 2016-11-18 MED ORDER — LISDEXAMFETAMINE DIMESYLATE 70 MG PO CAPS: 70.0000 mg | ORAL_CAPSULE | ORAL | 0 refills | Status: DC

## 2016-11-18 MED ORDER — ALPRAZOLAM 1 MG PO TABS: ORAL_TABLET | ORAL | 1 refills | Status: DC

## 2016-11-18 MED ORDER — FUROSEMIDE 20 MG PO TABS: ORAL_TABLET | ORAL | 1 refills | Status: DC

## 2016-11-18 MED ORDER — ATORVASTATIN CALCIUM 40 MG PO TABS: 40.0000 mg | ORAL_TABLET | Freq: Every day | ORAL | 0 refills | Status: DC

## 2016-11-18 MED ORDER — PHENTERMINE-TOPIRAMATE ER 7.5-46 MG PO CP24: 1.0000 | ORAL_CAPSULE | Freq: Every morning | ORAL | 0 refills | Status: DC

## 2016-11-18 MED ORDER — ZOLPIDEM TARTRATE ER 12.5 MG PO TBCR: EXTENDED_RELEASE_TABLET | ORAL | 1 refills | Status: DC

## 2016-11-18 MED ORDER — INSULIN PEN NEEDLE 31G X 5 MM MISC: 3 refills | Status: DC

## 2016-11-18 MED ORDER — BUPROPION HCL ER (XL) 300 MG PO TB24: 300.0000 mg | ORAL_TABLET | Freq: Every day | ORAL | 1 refills | Status: DC

## 2016-11-18 MED ORDER — HYDROCHLOROTHIAZIDE 12.5 MG PO CAPS: 25.0000 mg | ORAL_CAPSULE | Freq: Every day | ORAL | 0 refills | Status: DC

## 2016-11-18 MED ORDER — BUPROPION HCL ER (XL) 150 MG PO TB24: 150.0000 mg | ORAL_TABLET | Freq: Every day | ORAL | 1 refills | Status: DC

## 2016-11-18 MED ORDER — AMBULATORY NON FORMULARY MEDICATION: 3 refills | Status: DC

## 2016-11-18 MED ORDER — BUPROPION HCL ER (XL) 150 MG PO TB24: 150.0000 mg | ORAL_TABLET | Freq: Every day | ORAL | 0 refills | Status: DC

## 2016-11-18 MED ORDER — LAMOTRIGINE 150 MG PO TABS: 150.0000 mg | ORAL_TABLET | Freq: Every day | ORAL | 4 refills | Status: DC

## 2016-11-18 MED ORDER — GLUCAGON (RDNA) 1 MG IJ KIT: PACK | INTRAMUSCULAR | 1 refills | Status: DC

## 2016-11-18 MED ORDER — INSULIN DETEMIR 100 UNIT/ML FLEXPEN: 65.0000 [IU] | PEN_INJECTOR | Freq: Two times a day (BID) | SUBCUTANEOUS | 3 refills | Status: DC

## 2016-11-18 MED ORDER — FLUCONAZOLE 150 MG PO TABS: 150.0000 mg | ORAL_TABLET | Freq: Every day | ORAL | 1 refills | Status: DC

## 2016-11-18 MED ORDER — HYDROCHLOROTHIAZIDE 12.5 MG PO CAPS: 25.0000 mg | ORAL_CAPSULE | Freq: Every day | ORAL | 1 refills | Status: DC

## 2016-11-18 NOTE — Progress Notes (Signed)
Subjective:    Patient ID: Danya [place], female    DOB: 1970/12/03, 46 y.o.   MRN: [id number]  HPI  Pt is a 46 yo type 1 DM who has multiple comorbidies and disabled who presents to the clinic for 3 month follow up.   ... Active Ambulatory Problems    Diagnosis Date Noted  . Obesity 11/23/2009  . ANXIETY STATE NOS 11/12/2006  . INSOMNIA, CHRONIC 12/16/2006  . MDD (major depressive disorder), recurrent episode, moderate (HCC) 11/12/2006  . Essential hypertension, benign 11/23/2009  . ALLERGIC RHINITIS, SEASONAL 07/03/2009  . WEIGHT GAIN 11/12/2006  . LUMBAGO 03/19/2010  . Eczema 03/26/2011  . Carpal tunnel syndrome 03/26/2011  . Anemia 02/05/2012  . Post-traumatic wound infection 05/13/2012  . Toe osteomyelitis, left (HCC) 05/31/2012  . Adult ADHD 08/09/2012  . Neuropathy 03/25/2013  . Cataract of both eyes 09/13/2013  . Mood disorder (HCC) 09/13/2013  . GAD (generalized anxiety disorder) 09/13/2013  . 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 Vitreous hemorrhage of left eye (HCC) 04/26/2014  . 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 Right foot pain 02/12/2015  . Poor dentition 02/12/2015  . 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  . Left foot pain 02/13/2015  . DDD (degenerative disc disease), lumbar 02/13/2015  . Swelling of lower extremity 07/13/2015  . Pure hypercholesterolemia 05/22/2016  . BMI 31.0-31.9,adult 11/20/2016   Resolved Ambulatory Problems    Diagnosis Date Noted  . URI 07/03/2009  . TRIGGER FINGER, LEFT THUMB 05/14/2009  . FOOT SPRAIN, RIGHT 02/24/2010  . FOOT INJURY, RIGHT 02/24/2010  . ADHD 03/19/2010  . Acute bronchitis 10/06/2010  . Cough 10/06/2010  . Wound, open, leg 12/25/2011  . Gas gangrene of extremity (HCC) 02/05/2012  . Cellulitis of leg, left 05/13/2012   Past  Medical History:  Diagnosis Date  . Anxiety   . Depression   . Diabetes mellitus 3-10  . DM type 2 causing CKD stage 3 (HCC)   . PTSD (post-traumatic stress disorder)    She feels like her mood is ok. Denies any SI/HC thoughts. She feels like belviq has stopped working and would like to try something else.   She has no problem with attention and able to stay focused.   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 She checks her sugars frequently. Fasting seems to be running 150's and post prandial 180's. She is using long acting and sliding scale at meal time. She has been on abx in last 3 months. No open sores or wounds.   Review of Systems    see HPI.  Objective:   Physical Exam  Constitutional: She is oriented to person, place, and time. She appears well-developed and well-nourished.  HENT:  Head: Normocephalic and atraumatic.  Cardiovascular: Normal rate, regular rhythm and normal heart sounds.   Pulmonary/Chest: Effort normal and breath sounds normal.  Neurological: She is alert and oriented to person, place, and time.  Skin: No rash noted.  Psychiatric: She has a normal mood and affect. Her behavior is normal.          Assessment & Plan:  [name] KitchenMarland KitchenDeanna was seen today for diabetes.  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 -     insulin lispro (HUMALOG) 100 UNIT/ML KiwkPen; Use sliding scale at meal time. -     glucagon 1 MG injection; Follow package directions for low blood sugar. -     Insulin Pen Needle (B-D UF III MINI PEN NEEDLES) 31G X 5 MM MISC; USE EVERY NIGHT AS DIRECTED WITH INSULIN PENS  Influenza vaccine needed -     Flu Vaccine QUAD 6+ mos PF IM (Fluarix Quad PF)  GAD (generalized anxiety disorder) -     Discontinue: buPROPion (WELLBUTRIN XL) 150 MG 24 hr tablet; Take 1 tablet (150  mg total) by mouth daily. -     Discontinue: buPROPion (WELLBUTRIN XL) 300 MG 24 hr tablet; Take 1 tablet (300 mg total) by mouth daily. -     buPROPion (WELLBUTRIN XL) 150 MG 24 hr tablet; Take 1 tablet (150 mg total) by mouth daily. -     buPROPion (WELLBUTRIN XL) 300 MG 24 hr tablet; Take 1 tablet (300 mg total) by mouth daily.  MDD (major depressive disorder), recurrent episode, moderate (HCC) -     Discontinue: buPROPion (WELLBUTRIN XL) 150 MG 24 hr tablet; Take 1 tablet (150 mg total) by mouth daily. -     Discontinue: buPROPion (WELLBUTRIN XL) 300 MG 24 hr tablet; Take 1 tablet (300 mg total) by mouth daily. -     buPROPion (WELLBUTRIN XL) 150 MG 24 hr tablet; Take 1 tablet (150 mg total) by mouth daily. -     buPROPion (WELLBUTRIN XL) 300 MG 24 hr tablet; Take 1 tablet (300 mg total) by mouth daily.  Mood disorder (HCC) -     Discontinue: buPROPion (WELLBUTRIN XL) 150 MG 24 hr tablet; Take 1 tablet (150 mg total) by mouth daily. -     Discontinue: buPROPion (WELLBUTRIN XL) 300 MG 24 hr tablet; Take 1 tablet (300 mg total) by mouth daily. -     buPROPion (WELLBUTRIN XL) 150 MG 24 hr tablet; Take 1 tablet (150 mg total) by mouth daily. -     buPROPion (WELLBUTRIN XL) 300 MG 24 hr tablet; Take 1 tablet (300 mg total) by mouth daily. -     lamoTRIgine (LAMICTAL) 150 MG tablet; Take 1 tablet (150 mg total) by mouth daily.  Medication refill -     Discontinue: hydrochlorothiazide (MICROZIDE) 12.5 MG capsule; Take 2 capsules (25 mg total) by mouth daily. -     Discontinue: lisdexamfetamine (VYVANSE) 70 MG capsule; Take 1 capsule (70 mg total) by mouth every morning. -     Discontinue: lisdexamfetamine (VYVANSE) 70 MG capsule; Take 1 capsule (70 mg total) by mouth every morning. -     amitriptyline (ELAVIL) 10 MG tablet; TAKE 4 TABLETS AS NEEDED FOR INSOMNIA -     hydrochlorothiazide (MICROZIDE) 12.5 MG capsule; Take 2 capsules (25 mg total) by mouth daily. -     Insulin Detemir (LEVEMIR  FLEXTOUCH) 100 UNIT/ML Pen; Inject 65-70 Units into the skin 2 (two) times daily. -     insulin lispro (HUMALOG) 100 UNIT/ML KiwkPen; Use sliding scale at meal time. -     meloxicam (MOBIC) 15 MG tablet; Take 1 tablet (15 mg total) by mouth daily. -     lamoTRIgine (LAMICTAL) 150 MG tablet; Take 1 tablet (150 mg total) by mouth daily. -     traMADol (ULTRAM) 50 MG tablet; 1-2 tabs by mouth Q8 hours, maximum 6 tabs per day. -  zolpidem (AMBIEN CR) 12.5 MG CR tablet; TAKE 1 TABLET BY MOUTH AT BEDTIME -     glucagon 1 MG injection; Follow package directions for low blood sugar. -     Insulin Pen Needle (B-D UF III MINI PEN NEEDLES) 31G X 5 MM MISC; USE EVERY NIGHT AS DIRECTED WITH INSULIN PENS -     lisdexamfetamine (VYVANSE) 70 MG capsule; Take 1 capsule (70 mg total) by mouth every morning.  Adult ADHD -     Discontinue: lisdexamfetamine (VYVANSE) 70 MG capsule; Take 1 capsule (70 mg total) by mouth every morning. -     Discontinue: lisdexamfetamine (VYVANSE) 70 MG capsule; Take 1 capsule (70 mg total) by mouth every morning. -     lisdexamfetamine (VYVANSE) 70 MG capsule; Take 1 capsule (70 mg total) by mouth every morning.  Anxiety -     ALPRAZolam (XANAX) 1 MG tablet; TAKE 1 TABLET BY MOUTH 3 TIMES A DAY  Pure hypercholesterolemia -     atorvastatin (LIPITOR) 40 MG tablet; Take 1 tablet (40 mg total) by mouth daily.  BMI 31.0-31.9,adult -     Phentermine-Topiramate 3.75-23 MG CP24; Take 1 tablet by mouth every morning. -     Phentermine-Topiramate 7.5-46 MG CP24; Take 1 tablet by mouth every morning.  Lower extremity edema -     furosemide (LASIX) 20 MG tablet; TAKE 1 TABLET (20 MG TOTAL) BY MOUTH DAILY AS NEEDED FOR EDEMA.  Other orders -     AMBULATORY NON FORMULARY MEDICATION; One touch Verio test strips  Testing 4 times a day -     fluconazole (DIFLUCAN) 150 MG tablet; Take 1 tablet (150 mg total) by mouth daily.   .. Depression screen [facility] 2/9 11/18/2016 06/21/2012   Decreased Interest 2 0  Down, Depressed, Hopeless 2 0  PHQ - 2 Score 4 0  Altered sleeping 3 -  Tired, decreased energy 1 -  Change in appetite 0 -  Feeling bad or failure about yourself  1 -  Trouble concentrating 1 -  Moving slowly or fidgety/restless 1 -  Suicidal thoughts 0 -  PHQ-9 Score 11 -       .[name] Lab Results  Component Value Date   HGBA1C 7.6 11/18/2016   She recently had labs drawn. Will get records.  Need LDL to determine need for statin.  BP not controlled today. Per patient she is checking and below 140/90 but I suggested goal to be under 130/90. Cont to monitor  vyvase for 3 months.

## 2016-11-20 DIAGNOSIS — Z6831 Body mass index (BMI) 31.0-31.9, adult: Secondary | ICD-10-CM | POA: Insufficient documentation

## 2016-12-01 ENCOUNTER — Telehealth: Payer: Self-pay | Admitting: Physician Assistant

## 2016-12-01 NOTE — Telephone Encounter (Signed)
Pt is requesting an alternative sleep aid med than the one previously prescribed. She stated that the side effects were brutal, so she had to stop taking it. Please advise. Thanks!

## 2016-12-02 NOTE — Telephone Encounter (Signed)
She has been on [place] for a long time. When did this side effect start to occur. And can you please find out what she has tried? I just send 90 day rx to her mail order when she came in for a visit.

## 2016-12-02 NOTE — Telephone Encounter (Signed)
Please advise if patient needs to come in for an appointment or if new medication will be sent to her pharmacy. Rhonda Cunningham,CMA

## 2016-12-03 NOTE — Telephone Encounter (Signed)
Left VM for Pt to return clinic call.  

## 2016-12-22 ENCOUNTER — Other Ambulatory Visit: Payer: Self-pay | Admitting: Physician Assistant

## 2016-12-22 DIAGNOSIS — Z760 Encounter for issue of repeat prescription: Secondary | ICD-10-CM

## 2016-12-22 DIAGNOSIS — E1043 Type 1 diabetes mellitus with diabetic autonomic (poly)neuropathy: Secondary | ICD-10-CM

## 2016-12-22 DIAGNOSIS — F39 Unspecified mood [affective] disorder: Secondary | ICD-10-CM

## 2016-12-22 DIAGNOSIS — F331 Major depressive disorder, recurrent, moderate: Secondary | ICD-10-CM

## 2016-12-22 DIAGNOSIS — F411 Generalized anxiety disorder: Secondary | ICD-10-CM

## 2016-12-24 ENCOUNTER — Other Ambulatory Visit: Payer: Self-pay | Admitting: Physician Assistant

## 2016-12-24 ENCOUNTER — Encounter: Payer: Self-pay | Admitting: Physician Assistant

## 2016-12-24 DIAGNOSIS — Z760 Encounter for issue of repeat prescription: Secondary | ICD-10-CM

## 2016-12-24 DIAGNOSIS — E1043 Type 1 diabetes mellitus with diabetic autonomic (poly)neuropathy: Secondary | ICD-10-CM

## 2016-12-24 NOTE — Telephone Encounter (Signed)
Here are the refills she needs

## 2016-12-31 ENCOUNTER — Other Ambulatory Visit: Payer: Self-pay | Admitting: Physician Assistant

## 2016-12-31 DIAGNOSIS — F411 Generalized anxiety disorder: Secondary | ICD-10-CM

## 2016-12-31 DIAGNOSIS — F331 Major depressive disorder, recurrent, moderate: Secondary | ICD-10-CM

## 2016-12-31 DIAGNOSIS — F39 Unspecified mood [affective] disorder: Secondary | ICD-10-CM

## 2017-02-10 ENCOUNTER — Encounter: Payer: Self-pay | Admitting: Physician Assistant

## 2017-02-10 ENCOUNTER — Ambulatory Visit: Payer: BLUE CROSS/BLUE SHIELD | Admitting: Physician Assistant

## 2017-02-10 VITALS — BP 130/76 | HR 99 | Ht 64.0 in | Wt 183.0 lb

## 2017-02-10 DIAGNOSIS — F419 Anxiety disorder, unspecified: Secondary | ICD-10-CM | POA: Diagnosis not present

## 2017-02-10 DIAGNOSIS — Z760 Encounter for issue of repeat prescription: Secondary | ICD-10-CM

## 2017-02-10 DIAGNOSIS — F331 Major depressive disorder, recurrent, moderate: Secondary | ICD-10-CM | POA: Diagnosis not present

## 2017-02-10 DIAGNOSIS — F411 Generalized anxiety disorder: Secondary | ICD-10-CM

## 2017-02-10 DIAGNOSIS — F39 Unspecified mood [affective] disorder: Secondary | ICD-10-CM

## 2017-02-10 DIAGNOSIS — E1043 Type 1 diabetes mellitus with diabetic autonomic (poly)neuropathy: Secondary | ICD-10-CM | POA: Diagnosis not present

## 2017-02-10 DIAGNOSIS — L700 Acne vulgaris: Secondary | ICD-10-CM | POA: Diagnosis not present

## 2017-02-10 DIAGNOSIS — E103593 Type 1 diabetes mellitus with proliferative diabetic retinopathy without macular edema, bilateral: Secondary | ICD-10-CM

## 2017-02-10 DIAGNOSIS — F909 Attention-deficit hyperactivity disorder, unspecified type: Secondary | ICD-10-CM | POA: Diagnosis not present

## 2017-02-10 LAB — POCT GLYCOSYLATED HEMOGLOBIN (HGB A1C): Hemoglobin A1C: 8.5

## 2017-02-10 MED ORDER — ZOLPIDEM TARTRATE ER 12.5 MG PO TBCR: EXTENDED_RELEASE_TABLET | ORAL | 1 refills | Status: DC

## 2017-02-10 MED ORDER — TRAMADOL HCL 50 MG PO TABS: ORAL_TABLET | ORAL | 1 refills | Status: DC

## 2017-02-10 MED ORDER — LISDEXAMFETAMINE DIMESYLATE 70 MG PO CAPS: 70.0000 mg | ORAL_CAPSULE | ORAL | 0 refills | Status: DC

## 2017-02-10 MED ORDER — BUPROPION HCL ER (XL) 150 MG PO TB24
150.0000 mg | ORAL_TABLET | Freq: Every day | ORAL | 1 refills | Status: DC
Start: 2017-02-10 — End: 2017-05-19

## 2017-02-10 MED ORDER — DAPSONE 5 % EX GEL: 1.0000 "application " | Freq: Two times a day (BID) | CUTANEOUS | 3 refills | Status: DC

## 2017-02-10 MED ORDER — ALPRAZOLAM 1 MG PO TABS: ORAL_TABLET | ORAL | 1 refills | Status: DC

## 2017-02-10 NOTE — Progress Notes (Addendum)
Subjective:    Patient ID: [place], female    DOB: May 23, 1970, 46 y.o.   MRN: [id number]  HPI Patient is a 46 y/o female with PMH of T1DM with peripheral neuropathy, GAD, MDD, ADD and acne who presents for follow-up.  T1DM - A1c is elevated at 8.5% from 7.6% in September. She reports recent tooth and jaw infection that required antibiotics and attributes the rise to this. She has been administering 5 units if Humalog before meals which has been helping to lower sugars, if her blood sugar is still elevated 2 hrs after her meal she will administer another 1-2 units. She also reports Levemir 2 times daily for the last 3 weeks. Patient reports last eye exam was 1 month ago and her diabetic retinopathy is stable, she does not need to follow-up with them for another 6 months. Patient reports new onset of pain in the lateral aspect of her left foot that runs up her leg. She is being seen by Dr. Romualdo Bolkial for her neuropathy and reports no changes. Foot exam was performed by Dr. Romualdo Bolkial at her last visit. She reports increased swelling and request her HCTZ to be increased.  ADD- doing well. Controlled on vyvanse. No problems or concerns.   Acne - Patient would like to change acne medication from clindamycin to the dapsone because she experienced better relief of acne on the dapsone.  Anxiety - Patient reports good control of her anxiety on Xanax.  Pap performed this year by [place] in [place].  .. Active Ambulatory Problems    Diagnosis Date Noted  . Obesity 11/23/2009  . ANXIETY STATE NOS 11/12/2006  . INSOMNIA, CHRONIC 12/16/2006  . MDD (major depressive disorder), recurrent episode, moderate (HCC) 11/12/2006  . Essential hypertension, benign 11/23/2009  . ALLERGIC RHINITIS, SEASONAL 07/03/2009  . WEIGHT GAIN 11/12/2006  . LUMBAGO 03/19/2010  . Eczema 03/26/2011  . Carpal tunnel syndrome 03/26/2011  . Anemia 02/05/2012  . Post-traumatic wound infection 05/13/2012  . Toe osteomyelitis,  left (HCC) 05/31/2012  . Adult ADHD 08/09/2012  . Neuropathy 03/25/2013  . Cataract of both eyes 09/13/2013  . Mood disorder (HCC) 09/13/2013  . GAD (generalized anxiety disorder) 09/13/2013  . 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 Vitreous hemorrhage of left eye (HCC) 04/26/2014  . 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 Right foot pain 02/12/2015  . Poor dentition 02/12/2015  . 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  . Left foot pain 02/13/2015  . DDD (degenerative disc disease), lumbar 02/13/2015  . Swelling of lower extremity 07/13/2015  . Pure hypercholesterolemia 05/22/2016  . BMI 31.0-31.9,adult 11/20/2016   Resolved Ambulatory Problems    Diagnosis Date Noted  . URI 07/03/2009  . TRIGGER FINGER, LEFT THUMB 05/14/2009  . FOOT SPRAIN, RIGHT 02/24/2010  . FOOT INJURY, RIGHT 02/24/2010  . ADHD 03/19/2010  . Acute bronchitis 10/06/2010  . Cough 10/06/2010  . Wound, open, leg 12/25/2011  . Gas gangrene of extremity (HCC) 02/05/2012  . Cellulitis of leg, left 05/13/2012   Past Medical History:  Diagnosis Date  . Anxiety   . Depression   . Diabetes mellitus 3-10  . DM type 2 causing CKD stage 3 (HCC)   . PTSD (post-traumatic stress disorder)      Review of Systems See HPI, all other systems reviewed are negative    Objective:  Physical Exam  Constitutional: She is oriented to person, place, and time. She appears well-developed and well-nourished.  HENT:  Head: Normocephalic and atraumatic.  Cardiovascular: Normal rate, regular rhythm and normal heart sounds.  Pulmonary/Chest: Effort normal and breath sounds normal.  Neurological: She is alert and oriented to person, place, and time.  Skin: Skin is warm and dry.  Psychiatric: She has a normal mood and affect. Her behavior is normal.  Thought content normal.  Vitals reviewed.     Assessment & Plan:  .[name].[name].Danea was seen today for diabetes and adhd.  Diagnoses and all orders for this visit:  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 (HCC) -     POCT HgB A1C  GAD (generalized anxiety disorder) -     buPROPion (WELLBUTRIN XL) 150 MG 24 hr tablet; Take 1 tablet (150 mg total) by mouth daily.  MDD (major depressive disorder), recurrent episode, moderate (HCC) -     buPROPion (WELLBUTRIN XL) 150 MG 24 hr tablet; Take 1 tablet (150 mg total) by mouth daily.  Mood disorder (HCC) -     buPROPion (WELLBUTRIN XL) 150 MG 24 hr tablet; Take 1 tablet (150 mg total) by mouth daily.  Medication refill -     lisdexamfetamine (VYVANSE) 70 MG capsule; Take 1 capsule (70 mg total) by mouth every morning. -     zolpidem (AMBIEN CR) 12.5 MG CR tablet; TAKE 1 TABLET BY MOUTH AT BEDTIME -     traMADol (ULTRAM) 50 MG tablet; 1-2 tabs by mouth Q8 hours, maximum 6 tabs per day.  Adult ADHD -     lisdexamfetamine (VYVANSE) 70 MG capsule; Take 1 capsule (70 mg total) by mouth every morning.  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  .. Lab Results  Component Value Date   HGBA1C 8.5 02/10/2017     T1DM with peripheral neuropathy - Patients A1c elevated today at 8.5% from 7.6% in September. She has been battling a tooth and jaw infection that has required antibiotics and prednisone and attributes her increased A1c to this. She continues to monitor her sugars at home. She started on Levemir 100 units twice daily 3 weeks ago and believes this will continue to decrease her blood sugars with continued use and follow-up in 3 months. - Tramadol refilled today. Controlled database checked  with no concerns. Pain contract signed in September 2018. Patient reports UDS in September, we will ry to find those result.s - Vaccinations up to date. Microalbuminuria performed in [place], patient is sending results.  GAD/MDD - Patient is doing well on Wellbutrin with Xanax as needed for anxiety. Continue as prescribed.  ADHD - Refilled Vyvanse, patient is doing well on current dose. Continue as prescribed.  Acne - Switched to Dapsone 5% topical gel applied twice daily because patient reports better control of acne with this medication.

## 2017-02-10 NOTE — Progress Notes (Deleted)
[facility] [place] [facility]. Dr. Augustin Coupeconate [facility]  Dr. Romualdo Bolkial foot exam. Same neuropathy  Pap Lesleigh Noeflorida  Micro mychar

## 2017-02-11 ENCOUNTER — Ambulatory Visit: Payer: Self-pay | Admitting: Physician Assistant

## 2017-03-12 ENCOUNTER — Encounter: Payer: Self-pay | Admitting: Physician Assistant

## 2017-03-16 ENCOUNTER — Encounter: Payer: Self-pay | Admitting: Physician Assistant

## 2017-03-16 ENCOUNTER — Other Ambulatory Visit: Payer: Self-pay | Admitting: Physician Assistant

## 2017-03-16 DIAGNOSIS — Z760 Encounter for issue of repeat prescription: Secondary | ICD-10-CM

## 2017-03-16 MED ORDER — INSULIN LISPRO 100 UNIT/ML (KWIKPEN): PEN_INJECTOR | SUBCUTANEOUS | 3 refills | Status: DC

## 2017-03-16 NOTE — Progress Notes (Signed)
Sent humalog

## 2017-03-17 ENCOUNTER — Other Ambulatory Visit: Payer: Self-pay | Admitting: Physician Assistant

## 2017-03-17 DIAGNOSIS — F909 Attention-deficit hyperactivity disorder, unspecified type: Secondary | ICD-10-CM

## 2017-03-17 DIAGNOSIS — Z760 Encounter for issue of repeat prescription: Secondary | ICD-10-CM

## 2017-03-17 NOTE — Progress Notes (Signed)
Can you send 60 to pharmacy because they will only let me do 30 day at a time. m,e

## 2017-03-18 MED ORDER — LISDEXAMFETAMINE DIMESYLATE 70 MG PO CAPS: 70.0000 mg | ORAL_CAPSULE | ORAL | 0 refills | Status: DC

## 2017-03-18 NOTE — Progress Notes (Signed)
Med sent e scribe Nani Gasseratherine Ivania Teagarden, MD

## 2017-03-23 ENCOUNTER — Encounter: Payer: Self-pay | Admitting: Physician Assistant

## 2017-03-23 DIAGNOSIS — Z760 Encounter for issue of repeat prescription: Secondary | ICD-10-CM

## 2017-03-23 DIAGNOSIS — F909 Attention-deficit hyperactivity disorder, unspecified type: Secondary | ICD-10-CM

## 2017-03-23 MED ORDER — LISDEXAMFETAMINE DIMESYLATE 70 MG PO CAPS
70.0000 mg | ORAL_CAPSULE | ORAL | 0 refills | Status: DC
Start: 2017-03-23 — End: 2017-05-20

## 2017-03-23 NOTE — Telephone Encounter (Signed)
Dr. Linford ArnoldMetheney vyvanse was sent [facility] when [facility]. Can you resend for 60 tablets when you can.

## 2017-03-23 NOTE — Telephone Encounter (Signed)
[facility]

## 2017-05-19 ENCOUNTER — Encounter: Payer: Self-pay | Admitting: Physician Assistant

## 2017-05-19 ENCOUNTER — Ambulatory Visit: Payer: BLUE CROSS/BLUE SHIELD | Admitting: Physician Assistant

## 2017-05-19 VITALS — BP 142/78 | HR 94 | Ht 64.0 in | Wt 179.0 lb

## 2017-05-19 DIAGNOSIS — R600 Localized edema: Secondary | ICD-10-CM | POA: Diagnosis not present

## 2017-05-19 DIAGNOSIS — E1043 Type 1 diabetes mellitus with diabetic autonomic (poly)neuropathy: Secondary | ICD-10-CM | POA: Diagnosis not present

## 2017-05-19 DIAGNOSIS — E7800 Pure hypercholesterolemia, unspecified: Secondary | ICD-10-CM | POA: Diagnosis not present

## 2017-05-19 DIAGNOSIS — F909 Attention-deficit hyperactivity disorder, unspecified type: Secondary | ICD-10-CM

## 2017-05-19 DIAGNOSIS — F411 Generalized anxiety disorder: Secondary | ICD-10-CM

## 2017-05-19 DIAGNOSIS — F39 Unspecified mood [affective] disorder: Secondary | ICD-10-CM | POA: Diagnosis not present

## 2017-05-19 DIAGNOSIS — Z760 Encounter for issue of repeat prescription: Secondary | ICD-10-CM

## 2017-05-19 DIAGNOSIS — F331 Major depressive disorder, recurrent, moderate: Secondary | ICD-10-CM

## 2017-05-19 DIAGNOSIS — G4700 Insomnia, unspecified: Secondary | ICD-10-CM

## 2017-05-19 DIAGNOSIS — I10 Essential (primary) hypertension: Secondary | ICD-10-CM

## 2017-05-19 DIAGNOSIS — F419 Anxiety disorder, unspecified: Secondary | ICD-10-CM

## 2017-05-19 DIAGNOSIS — J301 Allergic rhinitis due to pollen: Secondary | ICD-10-CM

## 2017-05-19 LAB — POCT GLYCOSYLATED HEMOGLOBIN (HGB A1C): Hemoglobin A1C: 7.9

## 2017-05-19 MED ORDER — FUROSEMIDE 20 MG PO TABS: ORAL_TABLET | ORAL | 1 refills | Status: DC

## 2017-05-19 MED ORDER — LORATADINE-PSEUDOEPHEDRINE ER 10-240 MG PO TB24: 1.0000 | ORAL_TABLET | Freq: Every day | ORAL | 1 refills | Status: DC | PRN

## 2017-05-19 MED ORDER — AZELASTINE HCL 0.1 % NA SOLN: 1.0000 | Freq: Two times a day (BID) | NASAL | 3 refills | Status: DC | PRN

## 2017-05-19 MED ORDER — TRAMADOL HCL 50 MG PO TABS: ORAL_TABLET | ORAL | 1 refills | Status: DC

## 2017-05-19 MED ORDER — BUPROPION HCL ER (XL) 300 MG PO TB24: 300.0000 mg | ORAL_TABLET | Freq: Every day | ORAL | 1 refills | Status: DC

## 2017-05-19 MED ORDER — ALPRAZOLAM 1 MG PO TABS: ORAL_TABLET | ORAL | 1 refills | Status: DC

## 2017-05-19 MED ORDER — ZOLPIDEM TARTRATE ER 12.5 MG PO TBCR: EXTENDED_RELEASE_TABLET | ORAL | 1 refills | Status: DC

## 2017-05-19 MED ORDER — MELOXICAM 15 MG PO TABS
15.0000 mg | ORAL_TABLET | Freq: Every day | ORAL | 2 refills | Status: DC
Start: 2017-05-19 — End: 2018-02-18

## 2017-05-19 MED ORDER — BUPROPION HCL ER (XL) 150 MG PO TB24: 150.0000 mg | ORAL_TABLET | Freq: Every day | ORAL | 1 refills | Status: DC

## 2017-05-19 MED ORDER — INSULIN DETEMIR 100 UNIT/ML FLEXPEN: PEN_INJECTOR | SUBCUTANEOUS | 3 refills | Status: DC

## 2017-05-19 MED ORDER — HYDROCHLOROTHIAZIDE 12.5 MG PO CAPS: 25.0000 mg | ORAL_CAPSULE | Freq: Every day | ORAL | 1 refills | Status: DC

## 2017-05-19 MED ORDER — GLUCAGON (RDNA) 1 MG IJ KIT: PACK | INTRAMUSCULAR | 1 refills | Status: DC

## 2017-05-19 MED ORDER — AMITRIPTYLINE HCL 10 MG PO TABS: ORAL_TABLET | ORAL | 1 refills | Status: DC

## 2017-05-19 MED ORDER — LISINOPRIL 5 MG PO TABS: 5.0000 mg | ORAL_TABLET | Freq: Every day | ORAL | 3 refills | Status: DC

## 2017-05-19 MED ORDER — ATORVASTATIN CALCIUM 80 MG PO TABS
80.0000 mg | ORAL_TABLET | Freq: Every day | ORAL | 3 refills | Status: DC
Start: 2017-05-19 — End: 2019-06-28

## 2017-05-19 MED ORDER — INSULIN LISPRO 100 UNIT/ML (KWIKPEN): PEN_INJECTOR | SUBCUTANEOUS | 3 refills | Status: DC

## 2017-05-19 NOTE — Progress Notes (Signed)
Subjective:    Patient ID: [place], female    DOB: [birth year], 47 y.o.   MRN: [id number]  HPI   Pt is a 47 yo female with extensive PMH.  .. Active Ambulatory Problems    Diagnosis Date Noted  . Obesity 11/23/2009  . ANXIETY STATE NOS 11/12/2006  . INSOMNIA, CHRONIC 12/16/2006  . MDD (major depressive disorder), recurrent episode, moderate (HCC) 11/12/2006  . Essential hypertension, benign 11/23/2009  . ALLERGIC RHINITIS, SEASONAL 07/03/2009  . WEIGHT GAIN 11/12/2006  . LUMBAGO 03/19/2010  . Eczema 03/26/2011  . Carpal tunnel syndrome 03/26/2011  . Anemia 02/05/2012  . Post-traumatic wound infection 05/13/2012  . Toe osteomyelitis, left (HCC) 05/31/2012  . Adult ADHD 08/09/2012  . Neuropathy 03/25/2013  . Cataract of both eyes 09/13/2013  . Mood disorder (HCC) 09/13/2013  . GAD (generalized anxiety disorder) 09/13/2013  . 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 Vitreous hemorrhage of left eye (HCC) 04/26/2014  . 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 Right foot pain 02/12/2015  . Poor dentition 02/12/2015  . 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  . Left foot pain 02/13/2015  . DDD (degenerative disc disease), lumbar 02/13/2015  . Swelling of lower extremity 07/13/2015  . Pure hypercholesterolemia 05/22/2016  . BMI 31.0-31.9,adult 11/20/2016   Resolved Ambulatory Problems    Diagnosis Date Noted  . URI 07/03/2009  . TRIGGER FINGER, LEFT THUMB 05/14/2009  . FOOT SPRAIN, RIGHT 02/24/2010  . FOOT INJURY, RIGHT 02/24/2010  . ADHD 03/19/2010  . Acute bronchitis 10/06/2010  . Cough 10/06/2010  . Wound, open, leg 12/25/2011  . Gas gangrene of extremity (HCC) 02/05/2012  . Cellulitis of leg, left 05/13/2012   Past Medical History:  Diagnosis Date  . Anxiety   . Depression   . Diabetes  mellitus 3-10  . DM type 2 causing CKD stage 3 (HCC)   . PTSD (post-traumatic stress disorder)    She is here for her 3 month follow up.   DM- most fasting sugars are 115 to 125. One hypoglycemic event. No open sores or wounds. She does have multiple stress fractures in left foot. In cam boot for 3 months. Continues to use levemir70 units bid and mealtime insulin. Last eye exam 2018/nov.  Brings in labs and LDL increased. On lipitor 40mg  daily. mammograme up to date.   ADHD- controlled on vyvanse. No CP, increased anxiety or palpitations.   . Family History  Problem Relation Age of Onset  . Cancer Father        Lung  . Transient ischemic attack Maternal Grandmother   . Heart disease Maternal Grandmother        Developed in 71's  . Diabetes Maternal Grandmother   . Hypertension Maternal Grandmother      Review of Systems  All other systems reviewed and are negative.      Objective:   Physical Exam  Constitutional: She is oriented to person, place, and time. She appears well-developed and well-nourished.  HENT:  Head: Normocephalic and atraumatic.  Neck: Normal range of motion. Neck supple.  Cardiovascular: Normal rate, regular rhythm and normal heart sounds.  Pulmonary/Chest: Effort normal and breath sounds normal. She has no wheezes.  Musculoskeletal:  Left foot in cam boot.   Lymphadenopathy:    She has no cervical adenopathy.  Neurological: She is alert and oriented to  person, place, and time.  Psychiatric: She has a normal mood and affect. Her behavior is normal.          Assessment & Plan:  [name] KitchenMarland KitchenDiagnoses and all orders for this visit:  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 (HCC) -     POCT HgB A1C -     lisinopril (PRINIVIL,ZESTRIL) 5 MG tablet; Take 1 tablet (5 mg total) by mouth daily. -     glucagon 1 MG injection; Follow package directions for low blood sugar. -     Insulin Detemir (LEVEMIR FLEXTOUCH) 100 UNIT/ML Pen; INJECT 65 TO  70 UNITS UNDER THE SKIN TWICE A DAY  Essential hypertension, benign -     lisinopril (PRINIVIL,ZESTRIL) 5 MG tablet; Take 1 tablet (5 mg total) by mouth daily.  Non-seasonal allergic rhinitis due to pollen -     loratadine-pseudoephedrine (CLARITIN-D 24-HOUR) 10-240 MG 24 hr tablet; Take 1 tablet by mouth daily as needed for allergies. -     azelastine (ASTELIN) 0.1 % nasal spray; Place 1 spray into both nostrils 2 (two) times daily as needed for rhinitis. Use in each nostril as directed  Adult ADHD  GAD (generalized anxiety disorder) -     buPROPion (WELLBUTRIN XL) 150 MG 24 hr tablet; Take 1 tablet (150 mg total) by mouth daily. -     buPROPion (WELLBUTRIN XL) 300 MG 24 hr tablet; Take 1 tablet (300 mg total) by mouth daily.  INSOMNIA, CHRONIC  Mood disorder (HCC) -     buPROPion (WELLBUTRIN XL) 150 MG 24 hr tablet; Take 1 tablet (150 mg total) by mouth daily. -     buPROPion (WELLBUTRIN XL) 300 MG 24 hr tablet; Take 1 tablet (300 mg total) by mouth daily.  MDD (major depressive disorder), recurrent episode, moderate (HCC) -     buPROPion (WELLBUTRIN XL) 150 MG 24 hr tablet; Take 1 tablet (150 mg total) by mouth daily. -     buPROPion (WELLBUTRIN XL) 300 MG 24 hr tablet; Take 1 tablet (300 mg total) by mouth daily.  Pure hypercholesterolemia -     atorvastatin (LIPITOR) 80 MG tablet; Take 1 tablet (80 mg total) by mouth daily.  Acute seasonal allergic rhinitis due to pollen -     loratadine-pseudoephedrine (CLARITIN-D 24-HOUR) 10-240 MG 24 hr tablet; Take 1 tablet by mouth daily as needed for allergies.  Lower extremity edema -     furosemide (LASIX) 20 MG tablet; TAKE 1 TABLET (20 MG TOTAL) BY MOUTH DAILY AS NEEDED FOR EDEMA.  Medication refill -     amitriptyline (ELAVIL) 10 MG tablet; TAKE 4 TABLETS AS NEEDED FOR INSOMNIA -     hydrochlorothiazide (MICROZIDE) 12.5 MG capsule; Take 2 capsules (25 mg total) by mouth daily. -     insulin lispro (HUMALOG KWIKPEN) 100 UNIT/ML  KiwkPen; USE SLIDING SCALE AT MEAL TIME -     meloxicam (MOBIC) 15 MG tablet; Take 1 tablet (15 mg total) by mouth daily. -     zolpidem (AMBIEN CR) 12.5 MG CR tablet; TAKE 1 TABLET BY MOUTH AT BEDTIME -     traMADol (ULTRAM) 50 MG tablet; 1-2 tabs by mouth Q8 hours, maximum 6 tabs per day. -     glucagon 1 MG injection; Follow package directions for low blood sugar. -     Insulin Detemir (LEVEMIR FLEXTOUCH) 100 UNIT/ML Pen; INJECT 65 TO 70 UNITS UNDER THE SKIN TWICE A DAY  Anxiety -     ALPRAZolam (XANAX) 1  MG tablet; TAKE 1 TABLET BY MOUTH 3 TIMES A DAY    Lab Results  Component Value Date   HGBA1C 7.9 05/19/2017   A!C improved but not to goal.  Continue Levimer and humalog.  Added ACE.  On STATIN increased to 80mg  due to last LDL being 175 in 12/2016.  [place].  BP not controlled added ACE.   Refilled xanax and wellbutrin. Mood doing well.   ADHD will pend for Dr. Linford Arnoldmetheney to send for 90 days.   Stress fracture managed by ortho.

## 2017-05-20 ENCOUNTER — Other Ambulatory Visit: Payer: Self-pay | Admitting: Physician Assistant

## 2017-05-20 DIAGNOSIS — Z760 Encounter for issue of repeat prescription: Secondary | ICD-10-CM

## 2017-05-20 DIAGNOSIS — F909 Attention-deficit hyperactivity disorder, unspecified type: Secondary | ICD-10-CM

## 2017-05-20 DIAGNOSIS — R600 Localized edema: Secondary | ICD-10-CM | POA: Insufficient documentation

## 2017-05-20 NOTE — Telephone Encounter (Signed)
Will you [facility]. Saw yesterday.

## 2017-05-21 MED ORDER — LISDEXAMFETAMINE DIMESYLATE 70 MG PO CAPS: 70.0000 mg | ORAL_CAPSULE | ORAL | 0 refills | Status: DC

## 2017-06-23 ENCOUNTER — Ambulatory Visit: Payer: Self-pay | Admitting: Physician Assistant

## 2017-08-18 ENCOUNTER — Ambulatory Visit: Payer: BLUE CROSS/BLUE SHIELD | Admitting: Physician Assistant

## 2017-08-18 ENCOUNTER — Encounter: Payer: Self-pay | Admitting: Physician Assistant

## 2017-08-18 VITALS — BP 134/65 | HR 100 | Wt 195.0 lb

## 2017-08-18 DIAGNOSIS — F331 Major depressive disorder, recurrent, moderate: Secondary | ICD-10-CM | POA: Diagnosis not present

## 2017-08-18 DIAGNOSIS — F419 Anxiety disorder, unspecified: Secondary | ICD-10-CM | POA: Diagnosis not present

## 2017-08-18 DIAGNOSIS — Z760 Encounter for issue of repeat prescription: Secondary | ICD-10-CM | POA: Diagnosis not present

## 2017-08-18 DIAGNOSIS — F411 Generalized anxiety disorder: Secondary | ICD-10-CM

## 2017-08-18 DIAGNOSIS — I10 Essential (primary) hypertension: Secondary | ICD-10-CM

## 2017-08-18 DIAGNOSIS — J3489 Other specified disorders of nose and nasal sinuses: Secondary | ICD-10-CM

## 2017-08-18 DIAGNOSIS — E1043 Type 1 diabetes mellitus with diabetic autonomic (poly)neuropathy: Secondary | ICD-10-CM | POA: Diagnosis not present

## 2017-08-18 DIAGNOSIS — F909 Attention-deficit hyperactivity disorder, unspecified type: Secondary | ICD-10-CM

## 2017-08-18 DIAGNOSIS — J301 Allergic rhinitis due to pollen: Secondary | ICD-10-CM | POA: Diagnosis not present

## 2017-08-18 DIAGNOSIS — M7989 Other specified soft tissue disorders: Secondary | ICD-10-CM | POA: Diagnosis not present

## 2017-08-18 LAB — POCT GLYCOSYLATED HEMOGLOBIN (HGB A1C): HEMOGLOBIN A1C: 7.5 % — AB (ref 4.0–5.6)

## 2017-08-18 MED ORDER — ALPRAZOLAM 1 MG PO TABS: ORAL_TABLET | ORAL | 1 refills | Status: DC

## 2017-08-18 MED ORDER — TRAMADOL HCL 50 MG PO TABS: ORAL_TABLET | ORAL | 1 refills | Status: DC

## 2017-08-18 MED ORDER — LORATADINE-PSEUDOEPHEDRINE ER 10-240 MG PO TB24: 1.0000 | ORAL_TABLET | Freq: Every day | ORAL | 1 refills | Status: DC | PRN

## 2017-08-18 MED ORDER — DAPSONE 7.5 % EX GEL: 1.0000 "application " | Freq: Two times a day (BID) | CUTANEOUS | 5 refills | Status: DC

## 2017-08-18 NOTE — Progress Notes (Signed)
Subjective:    Patient ID: Melissa [name], female    DOB: 1970-10-08, 47 y.o.   MRN: [id number]  HPI  Pt is a 47 yo female with T1DM with neuropathy, HTN, MDD, ADHD who presents to the clinic for 3 month follow up.   She is doing well. She does have upcoming surgery on her sinuses in [place].   She is doing well on her DM regimen. Her sugars at home fasting are 90's to 130's. She has appt with retina specialist in a few weeks. Vision is stable. She has no current infections or wounds that are not healed.   Her anxiety and depression is stable. Doing well. No SI/HC.   vyvanse controls focus very well. She denies any increase in anxiety, palpitations.   She takes tramadol regularly for her chronic pain. She is well controlled.     .. Active Ambulatory Problems    Diagnosis Date Noted  . Obesity 11/23/2009  . Anxiety 11/12/2006  . INSOMNIA, CHRONIC 12/16/2006  . MDD (major depressive disorder), recurrent episode, moderate (HCC) 11/12/2006  . Essential hypertension, benign 11/23/2009  . Non-seasonal allergic rhinitis due to pollen 07/03/2009  . WEIGHT GAIN 11/12/2006  . LUMBAGO 03/19/2010  . Eczema 03/26/2011  . Carpal tunnel syndrome 03/26/2011  . Anemia 02/05/2012  . Post-traumatic wound infection 05/13/2012  . Toe osteomyelitis, left (HCC) 05/31/2012  . Adult ADHD 08/09/2012  . Neuropathy 03/25/2013  . Cataract of both eyes 09/13/2013  . Mood disorder (HCC) 09/13/2013  . GAD (generalized anxiety disorder) 09/13/2013  . 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 Vitreous hemorrhage of left eye (HCC) 04/26/2014  . 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 Right foot pain 02/12/2015  . Poor dentition 02/12/2015  . 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  . Left foot pain 02/13/2015  . DDD (degenerative  disc disease), lumbar 02/13/2015  . Swelling of lower extremity 07/13/2015  . Pure hypercholesterolemia 05/22/2016  . BMI 31.0-31.9,adult 11/20/2016  . Lower extremity edema 05/20/2017  . Sinus pressure 08/24/2017   Resolved Ambulatory Problems    Diagnosis Date Noted  . URI 07/03/2009  . TRIGGER FINGER, LEFT THUMB 05/14/2009  . FOOT SPRAIN, RIGHT 02/24/2010  . FOOT INJURY, RIGHT 02/24/2010  . ADHD 03/19/2010  . Acute bronchitis 10/06/2010  . Cough 10/06/2010  . Wound, open, leg 12/25/2011  . Gas gangrene of extremity (HCC) 02/05/2012  . Cellulitis of leg, left 05/13/2012   Past Medical History:  Diagnosis Date  . Anxiety   . Depression   . Diabetes mellitus 3-10  . DM type 2 causing CKD stage 3 (HCC)   . PTSD (post-traumatic stress disorder)       Review of Systems  All other systems reviewed and are negative.      Objective:   Physical Exam  Constitutional: She is oriented to person, place, and time. She appears well-developed and well-nourished.  HENT:  Head: Normocephalic and atraumatic.  Eyes: Conjunctivae and EOM are normal.  Neck: Normal range of motion. Neck supple.  Cardiovascular: Normal rate and regular rhythm.  Pulmonary/Chest: Effort normal and breath sounds normal.  Neurological: She is alert and oriented to person, place, and time.  Psychiatric: She has a normal mood and affect. Her behavior is normal.          Assessment & Plan:  [name] KitchenMarland KitchenDeanna was seen today  for diabetes.  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  Non-seasonal allergic rhinitis due to pollen -      loratadine-pseudoephedrine (CLARITIN-D 24-HOUR) 10-240 MG 24 hr tablet; Take 1 tablet by mouth daily as needed for allergies.  Anxiety -     ALPRAZolam (XANAX) 1 MG tablet; TAKE 1 TABLET BY MOUTH 3 TIMES A DAY  Medication refill -     traMADol (ULTRAM) 50 MG tablet; 1-2 tabs by mouth Q8 hours, maximum 6 tabs per day.  Adult ADHD  GAD (generalized anxiety disorder)  Swelling of lower extremity  Sinus pressure  Other orders -     Dapsone 7.5 % GEL; Apply 1 application topically 2 (two) times daily.   .. [facility] 08/24/2017 11/18/2016 06/21/2012  Decreased Interest 1 2 0  Down, Depressed, Hopeless 1 2 0  PHQ - 2 Score 2 4 0  Altered sleeping 0 3 -  Tired, decreased energy 1 1 -  Change in appetite 0 0 -  Feeling bad or failure about yourself  1 1 -  Trouble concentrating 1 1 -  Moving slowly or fidgety/restless 0 1 -  Suicidal thoughts 0 0 -  PHQ-9 Score 5 11 -  Difficult doing work/chores Somewhat difficult - -  .[name]. GAD 7 : Generalized Anxiety Score 08/24/2017  Nervous, Anxious, on Edge 1  Control/stop worrying 1  Worry too much - different things 1  Trouble relaxing 1  Restless 0  Easily annoyed or irritable 1  Afraid - awful might happen 0  Total GAD 7 Score 5  Anxiety Difficulty Somewhat difficult     .[name]. Lab Results  Component Value Date   HGBA1C 7.5 (A) 08/18/2017   Sugars not quite to goal of under 7.  On ACE. On STATIN.  Pt goes on a lot of antibiotics and steroids for chronic conditions these frequently effect sugars. Pt sees podiatry regularly and eye specialist every 6 months. Need copy of report.    Tramadol refilled today. Pt on chronic pain contract at 11/18/2016.  [place] controlled substance database reviewed with no concerns.   vyvanse refilled for 3 months.

## 2017-08-19 ENCOUNTER — Other Ambulatory Visit: Payer: Self-pay | Admitting: Physician Assistant

## 2017-08-19 DIAGNOSIS — Z760 Encounter for issue of repeat prescription: Secondary | ICD-10-CM

## 2017-08-19 DIAGNOSIS — F909 Attention-deficit hyperactivity disorder, unspecified type: Secondary | ICD-10-CM

## 2017-08-19 MED ORDER — LISDEXAMFETAMINE DIMESYLATE 70 MG PO CAPS: 70.0000 mg | ORAL_CAPSULE | ORAL | 0 refills | Status: DC

## 2017-08-19 NOTE — Telephone Encounter (Signed)
I saw pt on 08/17/17. She needs [facility] Pended below.   Thanks.

## 2017-08-24 DIAGNOSIS — J3489 Other specified disorders of nose and nasal sinuses: Secondary | ICD-10-CM | POA: Insufficient documentation

## 2017-08-26 ENCOUNTER — Other Ambulatory Visit: Payer: Self-pay | Admitting: Physician Assistant

## 2017-08-26 DIAGNOSIS — Z760 Encounter for issue of repeat prescription: Secondary | ICD-10-CM

## 2017-11-17 ENCOUNTER — Ambulatory Visit: Payer: Self-pay | Admitting: Physician Assistant

## 2017-12-04 ENCOUNTER — Other Ambulatory Visit: Payer: Self-pay | Admitting: Physician Assistant

## 2017-12-04 DIAGNOSIS — Z760 Encounter for issue of repeat prescription: Secondary | ICD-10-CM

## 2017-12-09 ENCOUNTER — Other Ambulatory Visit: Payer: Self-pay | Admitting: Physician Assistant

## 2017-12-09 DIAGNOSIS — Z760 Encounter for issue of repeat prescription: Secondary | ICD-10-CM

## 2017-12-21 ENCOUNTER — Other Ambulatory Visit: Payer: Self-pay | Admitting: Physician Assistant

## 2017-12-22 ENCOUNTER — Encounter: Payer: Self-pay | Admitting: Physician Assistant

## 2018-02-08 ENCOUNTER — Ambulatory Visit: Payer: BLUE CROSS/BLUE SHIELD | Admitting: Physician Assistant

## 2018-02-08 ENCOUNTER — Encounter: Payer: Self-pay | Admitting: Physician Assistant

## 2018-02-08 ENCOUNTER — Other Ambulatory Visit: Payer: Self-pay | Admitting: Physician Assistant

## 2018-02-08 VITALS — BP 121/65 | HR 96 | Ht 64.0 in

## 2018-02-08 DIAGNOSIS — F411 Generalized anxiety disorder: Secondary | ICD-10-CM

## 2018-02-08 DIAGNOSIS — H349 Unspecified retinal vascular occlusion: Secondary | ICD-10-CM

## 2018-02-08 DIAGNOSIS — I10 Essential (primary) hypertension: Secondary | ICD-10-CM | POA: Diagnosis not present

## 2018-02-08 DIAGNOSIS — E1043 Type 1 diabetes mellitus with diabetic autonomic (poly)neuropathy: Secondary | ICD-10-CM

## 2018-02-08 DIAGNOSIS — E1039 Type 1 diabetes mellitus with other diabetic ophthalmic complication: Secondary | ICD-10-CM | POA: Diagnosis not present

## 2018-02-08 DIAGNOSIS — F39 Unspecified mood [affective] disorder: Secondary | ICD-10-CM

## 2018-02-08 DIAGNOSIS — R600 Localized edema: Secondary | ICD-10-CM

## 2018-02-08 DIAGNOSIS — E103593 Type 1 diabetes mellitus with proliferative diabetic retinopathy without macular edema, bilateral: Secondary | ICD-10-CM

## 2018-02-08 DIAGNOSIS — J301 Allergic rhinitis due to pollen: Secondary | ICD-10-CM

## 2018-02-08 DIAGNOSIS — F331 Major depressive disorder, recurrent, moderate: Secondary | ICD-10-CM

## 2018-02-08 DIAGNOSIS — Z760 Encounter for issue of repeat prescription: Secondary | ICD-10-CM

## 2018-02-08 DIAGNOSIS — F909 Attention-deficit hyperactivity disorder, unspecified type: Secondary | ICD-10-CM

## 2018-02-08 LAB — POCT GLYCOSYLATED HEMOGLOBIN (HGB A1C): HEMOGLOBIN A1C: 8 % — AB (ref 4.0–5.6)

## 2018-02-09 ENCOUNTER — Ambulatory Visit: Payer: Self-pay | Admitting: Physician Assistant

## 2018-02-12 ENCOUNTER — Other Ambulatory Visit: Payer: Self-pay

## 2018-02-12 ENCOUNTER — Other Ambulatory Visit: Payer: Self-pay | Admitting: Physician Assistant

## 2018-02-12 DIAGNOSIS — Z760 Encounter for issue of repeat prescription: Secondary | ICD-10-CM

## 2018-02-12 DIAGNOSIS — F909 Attention-deficit hyperactivity disorder, unspecified type: Secondary | ICD-10-CM

## 2018-02-12 MED ORDER — LISDEXAMFETAMINE DIMESYLATE 70 MG PO CAPS: 70.0000 mg | ORAL_CAPSULE | ORAL | 0 refills | Status: DC

## 2018-02-12 MED ORDER — ZOLPIDEM TARTRATE ER 12.5 MG PO TBCR: EXTENDED_RELEASE_TABLET | ORAL | 1 refills | Status: DC

## 2018-02-12 NOTE — Addendum Note (Signed)
Addended by: Nani GasserMETHENEY, CATHERINE D on: 02/12/2018 01:50 PM   Modules accepted: Orders

## 2018-02-12 NOTE — Telephone Encounter (Signed)
Vyvanse prescription sent to pharmacy.

## 2018-02-12 NOTE — Telephone Encounter (Signed)
Patient requests a refill on Vyvanse and Ambien to be sent to [facility].

## 2018-02-12 NOTE — Telephone Encounter (Signed)
Just seen this week can you send 90 day rx.

## 2018-02-18 MED ORDER — AMITRIPTYLINE HCL 10 MG PO TABS: ORAL_TABLET | ORAL | 1 refills | Status: DC

## 2018-02-18 MED ORDER — LORATADINE-PSEUDOEPHEDRINE ER 10-240 MG PO TB24: 1.0000 | ORAL_TABLET | Freq: Every day | ORAL | 1 refills | Status: DC | PRN

## 2018-02-18 MED ORDER — LAMOTRIGINE 150 MG PO TABS: 150.0000 mg | ORAL_TABLET | Freq: Every day | ORAL | 4 refills | Status: DC

## 2018-02-18 MED ORDER — FUROSEMIDE 20 MG PO TABS: ORAL_TABLET | ORAL | 1 refills | Status: DC

## 2018-02-18 MED ORDER — TRAMADOL HCL 50 MG PO TABS: ORAL_TABLET | ORAL | 1 refills | Status: DC

## 2018-02-18 MED ORDER — BUPROPION HCL ER (XL) 300 MG PO TB24: 300.0000 mg | ORAL_TABLET | Freq: Every day | ORAL | 1 refills | Status: DC

## 2018-02-18 MED ORDER — MELOXICAM 15 MG PO TABS: 15.0000 mg | ORAL_TABLET | Freq: Every day | ORAL | 2 refills | Status: DC

## 2018-02-18 MED ORDER — HYDROCHLOROTHIAZIDE 12.5 MG PO CAPS: 25.0000 mg | ORAL_CAPSULE | Freq: Every day | ORAL | 3 refills | Status: DC

## 2018-02-18 MED ORDER — BUPROPION HCL ER (XL) 150 MG PO TB24: 150.0000 mg | ORAL_TABLET | Freq: Every day | ORAL | 1 refills | Status: DC

## 2018-02-18 MED ORDER — LISINOPRIL 5 MG PO TABS: 5.0000 mg | ORAL_TABLET | Freq: Every day | ORAL | 3 refills | Status: DC

## 2018-02-18 MED ORDER — FLUCONAZOLE 150 MG PO TABS: 150.0000 mg | ORAL_TABLET | Freq: Every day | ORAL | 0 refills | Status: DC

## 2018-02-18 NOTE — Progress Notes (Signed)
Subjective:    Patient ID: Melissa [name] [place], female    DOB: [birth year], 47 y.o.   MRN: [id number]  HPI Pt is a 47 yo female with T1DM, ADD, MDD who presents to the clinic for 3 month follow up.   DM- her sugars have been more elevated recently. She continues to use sliding scale insulin. She has been on a lot of antibiotics for a left ankle/foot wound infection/ulcer. [place] in [place] wound care center and hyperbaric chamber.   ADD- doing well. No problems or concerns.   MDD- overall doing well. No SI/HC.   [name].. Active Ambulatory Problems    Diagnosis Date Noted  . Obesity 11/23/2009  . Anxiety 11/12/2006  . INSOMNIA, CHRONIC 12/16/2006  . MDD (major depressive disorder), recurrent episode, moderate (HCC) 11/12/2006  . Essential hypertension, benign 11/23/2009  . Non-seasonal allergic rhinitis due to pollen 07/03/2009  . WEIGHT GAIN 11/12/2006  . LUMBAGO 03/19/2010  . Eczema 03/26/2011  . Carpal tunnel syndrome 03/26/2011  . Anemia 02/05/2012  . Post-traumatic wound infection 05/13/2012  . Toe osteomyelitis, left (HCC) 05/31/2012  . Adult ADHD 08/09/2012  . Neuropathy 03/25/2013  . Cataract of both eyes 09/13/2013  . Mood disorder (HCC) 09/13/2013  . GAD (generalized anxiety disorder) 09/13/2013  . 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 Vitreous hemorrhage of left eye (HCC) 04/26/2014  . 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 Right foot pain 02/12/2015  . Poor dentition 02/12/2015  . 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  . Left foot pain 02/13/2015  . DDD (degenerative disc disease), lumbar 02/13/2015  . Swelling of lower extremity 07/13/2015  . Pure hypercholesterolemia 05/22/2016  . BMI 31.0-31.9,adult 11/20/2016  . Lower extremity edema 05/20/2017  . Sinus pressure  08/24/2017   Resolved Ambulatory Problems    Diagnosis Date Noted  . URI 07/03/2009  . TRIGGER FINGER, LEFT THUMB 05/14/2009  . FOOT SPRAIN, RIGHT 02/24/2010  . FOOT INJURY, RIGHT 02/24/2010  . ADHD 03/19/2010  . Acute bronchitis 10/06/2010  . Cough 10/06/2010  . Wound, open, leg 12/25/2011  . Gas gangrene of extremity (HCC) 02/05/2012  . Cellulitis of leg, left 05/13/2012   Past Medical History:  Diagnosis Date  . Depression   . Diabetes mellitus 3-10  . DM type 2 causing CKD stage 3 (HCC)   . PTSD (post-traumatic stress disorder)       Review of Systems  All other systems reviewed and are negative.      Objective:   Physical Exam Vitals signs reviewed.  Constitutional:      Appearance: Normal appearance.  HENT:     Head: Normocephalic and atraumatic.  Cardiovascular:     Rate and Rhythm: Normal rate and regular rhythm.  Pulmonary:     Effort: Pulmonary effort is normal.     Breath sounds: Normal breath sounds.  Skin:    Comments: Left leg bandaged up.   Neurological:     General: No focal deficit present.     Mental Status: She is alert and oriented to person, place, and time.  Psychiatric:        Mood and Affect: Mood normal.        Behavior: Behavior normal.           Assessment & Plan:  [name].[name].Jasmen was seen today for follow-up.  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  Essential hypertension, benign -     lisinopril (PRINIVIL,ZESTRIL) 5 MG tablet; Take 1 tablet (5 mg total) by mouth daily.  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 -     buPROPion (WELLBUTRIN XL) 150 MG 24 hr tablet; Take 1 tablet (150 mg total) by mouth daily.  Medication refill -     lamoTRIgine (LAMICTAL) 150 MG tablet; Take 1 tablet (150 mg total) by mouth daily. -     traMADol (ULTRAM) 50 MG tablet; 1-2 tabs by mouth Q8 hours, maximum 6 tabs per day. -     meloxicam (MOBIC) 15 MG tablet; Take 1 tablet (15 mg total) by mouth daily. -     hydrochlorothiazide (MICROZIDE) 12.5 MG capsule; Take 2 capsules (25 mg total) by mouth daily. -     amitriptyline (ELAVIL) 10 MG tablet; TAKE 4 TABLETS AS NEEDED FOR INSOMNIA  Mood disorder (HCC) -     lamoTRIgine (LAMICTAL) 150 MG tablet; Take 1 tablet (150 mg total) by mouth daily. -     buPROPion (WELLBUTRIN XL) 300 MG 24 hr tablet; Take 1 tablet (300 mg total) by mouth daily. -     buPROPion (WELLBUTRIN XL) 150 MG 24 hr tablet; Take 1 tablet (150 mg total) by mouth daily.  Lower extremity edema -     furosemide (LASIX) 20 MG tablet; TAKE 1 TABLET (20 MG TOTAL) BY MOUTH DAILY AS NEEDED FOR EDEMA.  MDD (major depressive disorder), recurrent episode, moderate (HCC) -     buPROPion (WELLBUTRIN XL) 300 MG 24 hr tablet; Take 1 tablet (300 mg total) by mouth daily. -     buPROPion (WELLBUTRIN XL) 150 MG 24 hr tablet; Take 1 tablet (150 mg total) by mouth daily.  Acute seasonal allergic rhinitis due to pollen -     loratadine-pseudoephedrine (CLARITIN-D 24-HOUR) 10-240 MG 24 hr tablet; Take 1 tablet by mouth daily as needed for allergies.  Non-seasonal allergic rhinitis due to pollen -     loratadine-pseudoephedrine (CLARITIN-D 24-HOUR) 10-240 MG 24 hr tablet; Take 1 tablet by mouth daily as needed for allergies.  Other orders -     fluconazole (DIFLUCAN) 150 MG tablet; Take 1 tablet (150 mg total) by mouth daily.   .. Results for orders placed or performed in visit on 02/08/18  POCT glycosylated hemoglobin (Hb A1C)  Result Value Ref Range   Hemoglobin  A1C 8.0 (A) 4.0 - 5.6 %   HbA1c POC (<> result, manual entry)     HbA1c, POC (prediabetic range)     HbA1c, POC (controlled diabetic range)     A!C up a little bit but on abx and wound healing.  Continue sliding scale.  Diflucan for as needed yeast infections after abx.  BP great on ace.  On statin.  Vaccines up to date.   Medications refilled.

## 2018-04-20 ENCOUNTER — Telehealth: Payer: Self-pay | Admitting: Physician Assistant

## 2018-04-20 NOTE — Telephone Encounter (Signed)
Pt called clinic requesting Rx for Dexcom G6 continuous blood glucose monitoring system be sent to [facility]. She is still in [place] and hoping to have her left ankle surgery soon. Due the cyst/wounds she is currently doing hyperbaric treatment, her biopsy is Monday, and hopefully surgery will be 2 weeks later.   rx pended for pcp to complete directions and send.   [email or web address]

## 2018-04-22 NOTE — Telephone Encounter (Signed)
Patient called and left a message for status update.

## 2018-04-26 MED ORDER — DEXCOM G6 SENSOR MISC: 1.0000 | 5 refills | Status: DC

## 2018-04-26 MED ORDER — DEXCOM G6 TRANSMITTER MISC: 1.0000 | 3 refills | Status: DC

## 2018-04-26 MED ORDER — DEXCOM G6 RECEIVER DEVI: 1.0000 | 0 refills | Status: DC

## 2018-04-26 NOTE — Telephone Encounter (Signed)
Sent!

## 2018-04-26 NOTE — Telephone Encounter (Signed)
Left message advising of monitor and strips.

## 2018-05-11 ENCOUNTER — Ambulatory Visit: Payer: Self-pay | Admitting: Physician Assistant

## 2018-06-11 ENCOUNTER — Other Ambulatory Visit: Payer: Self-pay | Admitting: Physician Assistant

## 2018-06-11 DIAGNOSIS — Z760 Encounter for issue of repeat prescription: Secondary | ICD-10-CM

## 2018-06-11 DIAGNOSIS — E1043 Type 1 diabetes mellitus with diabetic autonomic (poly)neuropathy: Secondary | ICD-10-CM

## 2018-06-14 ENCOUNTER — Other Ambulatory Visit: Payer: Self-pay

## 2018-06-14 ENCOUNTER — Encounter: Payer: Self-pay | Admitting: Physician Assistant

## 2018-06-14 ENCOUNTER — Ambulatory Visit (INDEPENDENT_AMBULATORY_CARE_PROVIDER_SITE_OTHER): Payer: BLUE CROSS/BLUE SHIELD | Admitting: Physician Assistant

## 2018-06-14 VITALS — BP 110/70 | HR 98 | Temp 98.6°F | Ht 64.0 in | Wt 162.0 lb

## 2018-06-14 DIAGNOSIS — I10 Essential (primary) hypertension: Secondary | ICD-10-CM

## 2018-06-14 DIAGNOSIS — Z760 Encounter for issue of repeat prescription: Secondary | ICD-10-CM | POA: Diagnosis not present

## 2018-06-14 DIAGNOSIS — E1043 Type 1 diabetes mellitus with diabetic autonomic (poly)neuropathy: Secondary | ICD-10-CM

## 2018-06-14 DIAGNOSIS — L309 Dermatitis, unspecified: Secondary | ICD-10-CM

## 2018-06-14 DIAGNOSIS — F909 Attention-deficit hyperactivity disorder, unspecified type: Secondary | ICD-10-CM | POA: Diagnosis not present

## 2018-06-14 MED ORDER — DAPSONE 7.5 % EX GEL: 1.0000 "application " | Freq: Two times a day (BID) | CUTANEOUS | 2 refills | Status: DC

## 2018-06-14 MED ORDER — INSULIN DETEMIR 100 UNIT/ML FLEXPEN: PEN_INJECTOR | SUBCUTANEOUS | 3 refills | Status: DC

## 2018-06-14 MED ORDER — DEXCOM G6 TRANSMITTER MISC: 1.0000 | 3 refills | Status: DC

## 2018-06-14 MED ORDER — DEXCOM G6 SENSOR MISC: 1.0000 | 3 refills | Status: DC

## 2018-06-14 MED ORDER — DEXCOM G6 SENSOR MISC: 1.0000 | Freq: Once | 3 refills | Status: DC

## 2018-06-14 MED ORDER — DEXCOM G6 RECEIVER DEVI: 1.0000 | 1 refills | Status: DC

## 2018-06-14 NOTE — Progress Notes (Signed)
Upset about delay in foot surgery. In wheelchair and limiting patient. PHQ9-GAD7 completed. Refills pended.

## 2018-06-14 NOTE — Progress Notes (Signed)
Patient ID: Melissa [name], female   DOB: December 13, 1970, 48 y.o.   MRN: [id number] .[name] KitchenVirtual Visit via Telephone Note  I connected with An [name] on 06/14/18 at 10:50 AM EDT by telephone and verified that I am speaking with the correct person using two identifiers.   I discussed the limitations, risks, security and privacy concerns of performing an evaluation and management service by telephone and the availability of in person appointments. I also discussed with the patient that there may be a patient responsible charge related to this service. The patient expressed understanding and agreed to proceed.   History of Present Illness: Pt is a 48 yo female with T1DM, ADD, MDD who calls in to the clinic for 3 month follow up.   DM- she recently started using dexacom for the last month. Her sugars having been staying in the range of 165. She really loves it. She believes she had a recent a1c but will send me mychart with results. She will also have to get [facility] before surgery. She does need refill on insulin. She uses long acting and sliding scale.   ADD-doing well. No problems or concerns. Would like a 90 day supply.   Her surgery on foot has been delayed due to COVID she is in a wheelchair. Certainly her mood is stress but overall doing well.     .. Active Ambulatory Problems    Diagnosis Date Noted  . Obesity 11/23/2009  . Anxiety 11/12/2006  . INSOMNIA, CHRONIC 12/16/2006  . MDD (major depressive disorder), recurrent episode, moderate (HCC) 11/12/2006  . Essential hypertension, benign 11/23/2009  . Non-seasonal allergic rhinitis due to pollen 07/03/2009  . WEIGHT GAIN 11/12/2006  . LUMBAGO 03/19/2010  . Eczema 03/26/2011  . Carpal tunnel syndrome 03/26/2011  . Anemia 02/05/2012  . Post-traumatic wound infection 05/13/2012  . Toe osteomyelitis, left (HCC) 05/31/2012  . Adult ADHD 08/09/2012  . Neuropathy 03/25/2013  . Cataract of both eyes 09/13/2013  . Mood disorder (HCC)  09/13/2013  . GAD (generalized anxiety disorder) 09/13/2013  . 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 Vitreous hemorrhage of left eye (HCC) 04/26/2014  . 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 Right foot pain 02/12/2015  . Poor dentition 02/12/2015  . 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  . Left foot pain 02/13/2015  . DDD (degenerative disc disease), lumbar 02/13/2015  . Swelling of lower extremity 07/13/2015  . Pure hypercholesterolemia 05/22/2016  . BMI 31.0-31.9,adult 11/20/2016  . Lower extremity edema 05/20/2017  . Sinus pressure 08/24/2017   Resolved Ambulatory Problems    Diagnosis Date Noted  . URI 07/03/2009  . TRIGGER FINGER, LEFT THUMB 05/14/2009  . FOOT SPRAIN, RIGHT 02/24/2010  . FOOT INJURY, RIGHT 02/24/2010  . ADHD 03/19/2010  . Acute bronchitis 10/06/2010  . Cough 10/06/2010  . Wound, open, leg 12/25/2011  . Gas gangrene of extremity (HCC) 02/05/2012  . Cellulitis of leg, left 05/13/2012   Past Medical History:  Diagnosis Date  . Depression   . Diabetes mellitus 3-10  . DM type 2 causing CKD stage 3 (HCC)   . PTSD (post-traumatic stress disorder)    REveiwed med, allergy, problem list.     Observations/Objective: No acute distress. No labored breathing.  Mood within normal limits.  .. Depression screen [facility] 2/9 06/14/2018 08/24/2017 11/18/2016 06/21/2012  Decreased Interest 2 1 2  0  Down, Depressed, Hopeless 0 1 2 0  PHQ - 2 Score 2 2 4  0  Altered sleeping 3 0 3 -  Tired, decreased energy 2 1 1  -  Change in appetite 0 0 0 -  Feeling bad or failure about yourself  0 1 1 -  Trouble concentrating 0 1 1 -  Moving slowly or fidgety/restless 0 0 1 -  Suicidal thoughts 0 0 0 -  PHQ-9 Score 7 5 11  -  Difficult doing work/chores Somewhat difficult Somewhat difficult - -   .[name]. GAD  7 : Generalized Anxiety Score 06/14/2018 08/24/2017  Nervous, Anxious, on Edge 3 1  Control/stop worrying 1 1  Worry too much - different things 0 1  Trouble relaxing 1 1  Restless 0 0  Easily annoyed or irritable 1 1  Afraid - awful might happen 0 0  Total GAD 7 Score 6 5  Anxiety Difficulty Somewhat difficult Somewhat difficult     Assessment and Plan: [name].[name].Yamilett was seen today for diabetes and adhd.  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 Reapply every 10 days.  Medication refill -     Insulin Detemir (LEVEMIR FLEXTOUCH) 100 UNIT/ML Pen; INJECT 65 TO 70 UNITS UNDER THE SKIN TWICE A DAY -     Dapsone 7.5 % GEL; Apply 1 application topically 2 (two) times daily.  Adult ADHD  Essential hypertension, benign  Eczema, unspecified type -     Dapsone 7.5 % GEL; Apply 1 application topically 2 (two) times daily.   Refilled rx today.  Sugars appear to be doing better with dexacom.  .. Lab Results  Component Value Date   HGBA1C 8.0 (A) 02/08/2018   Last a1c was not controlled.  Will send me when has done by surgeon.   3 months of vyvanse sent.   Vitals look great. Does not need anymore refills.   Follow Up Instructions:    I discussed the assessment and treatment plan with the patient. The patient was provided an opportunity to ask questions and all were answered. The patient agreed with the plan and demonstrated an understanding of the instructions.   The patient was advised to call back or seek an in-person evaluation if the symptoms worsen or if the condition fails to improve as anticipated.  I provided 25 minutes of non-face-to-face time during this encounter.   Tandy GawJade Breeback,  PA-C

## 2018-06-15 ENCOUNTER — Other Ambulatory Visit: Payer: Self-pay | Admitting: Physician Assistant

## 2018-06-15 DIAGNOSIS — F909 Attention-deficit hyperactivity disorder, unspecified type: Secondary | ICD-10-CM

## 2018-06-15 DIAGNOSIS — Z760 Encounter for issue of repeat prescription: Secondary | ICD-10-CM

## 2018-06-15 MED ORDER — LISDEXAMFETAMINE DIMESYLATE 70 MG PO CAPS: 70.0000 mg | ORAL_CAPSULE | ORAL | 0 refills | Status: DC

## 2018-06-15 NOTE — Telephone Encounter (Signed)
Pt seen virtually 4/20. Requested 90 day rx.

## 2018-09-02 ENCOUNTER — Other Ambulatory Visit: Payer: Self-pay | Admitting: Physician Assistant

## 2018-09-02 DIAGNOSIS — F411 Generalized anxiety disorder: Secondary | ICD-10-CM

## 2018-09-02 DIAGNOSIS — F331 Major depressive disorder, recurrent, moderate: Secondary | ICD-10-CM

## 2018-09-02 DIAGNOSIS — F39 Unspecified mood [affective] disorder: Secondary | ICD-10-CM

## 2018-10-13 ENCOUNTER — Ambulatory Visit (INDEPENDENT_AMBULATORY_CARE_PROVIDER_SITE_OTHER): Payer: BC Managed Care – PPO | Admitting: Physician Assistant

## 2018-10-13 VITALS — BP 126/72 | HR 87 | Temp 98.3°F | Resp 17 | Ht 64.0 in | Wt 158.0 lb

## 2018-10-13 DIAGNOSIS — Z760 Encounter for issue of repeat prescription: Secondary | ICD-10-CM

## 2018-10-13 DIAGNOSIS — I10 Essential (primary) hypertension: Secondary | ICD-10-CM

## 2018-10-13 DIAGNOSIS — F5101 Primary insomnia: Secondary | ICD-10-CM

## 2018-10-13 DIAGNOSIS — F331 Major depressive disorder, recurrent, moderate: Secondary | ICD-10-CM

## 2018-10-13 DIAGNOSIS — F909 Attention-deficit hyperactivity disorder, unspecified type: Secondary | ICD-10-CM

## 2018-10-13 DIAGNOSIS — L309 Dermatitis, unspecified: Secondary | ICD-10-CM

## 2018-10-13 DIAGNOSIS — E103593 Type 1 diabetes mellitus with proliferative diabetic retinopathy without macular edema, bilateral: Secondary | ICD-10-CM

## 2018-10-13 DIAGNOSIS — G629 Polyneuropathy, unspecified: Secondary | ICD-10-CM

## 2018-10-13 MED ORDER — GVOKE HYPOPEN 1-PACK 0.5 MG/0.1ML ~~LOC~~ SOAJ: 0.1000 mg | SUBCUTANEOUS | 1 refills | Status: DC | PRN

## 2018-10-13 MED ORDER — TRIAMCINOLONE ACETONIDE 0.1 % EX CREA: 1.0000 "application " | TOPICAL_CREAM | Freq: Two times a day (BID) | CUTANEOUS | 1 refills | Status: DC

## 2018-10-13 MED ORDER — AMITRIPTYLINE HCL 10 MG PO TABS: ORAL_TABLET | ORAL | 1 refills | Status: DC

## 2018-10-13 NOTE — Progress Notes (Addendum)
Patient ID: Melissa [name], female   DOB: Apr 08, 1970, 48 y.o.   MRN: [id number] .[name] KitchenVirtual Visit via Video Note  I connected with Melissa [name] on 10/15/18 at  2:20 PM EDT by a video enabled telemedicine application and verified that I am speaking with the correct person using two identifiers.  Location: Patient: home Provider: clinic   I discussed the limitations of evaluation and management by telemedicine and the availability of in person appointments. The patient expressed understanding and agreed to proceed.  History of Present Illness: Pt is a 49 yo female with T1DM, ADHD, MDD, anxiety, insomnia, recurrent infections who presents for 3 month follow up.   She recently just had another surgery on foot in [place]. She is doing ok. Last a1c check was 6.8 per pt. She has about 1 hypoglycemic event a month. She has glucagon. She is using dexacom and using sliding scale insulin. She checks her sugars all the time. They are staying under 200.   She is having a harder time with depression and anxiety. She feels like mostly because she cannot bear weight right now on her foot and travel is so hard because of covid. She is trying to get back to [place]. No SI/HC.  She does need some refills of vyvanse/elavil. She would like to try pen form on glucagon. She on average has to use once a month for low BS readings.   .. Active Ambulatory Problems    Diagnosis Date Noted  . Obesity 11/23/2009  . Anxiety 11/12/2006  . INSOMNIA, CHRONIC 12/16/2006  . MDD (major depressive disorder), recurrent episode, moderate ([place]) 11/12/2006  . Essential hypertension, benign 11/23/2009  . Non-seasonal allergic rhinitis due to pollen 07/03/2009  . WEIGHT GAIN 11/12/2006  . LUMBAGO 03/19/2010  . Eczema 03/26/2011  . Carpal tunnel syndrome 03/26/2011  . Anemia 02/05/2012  . Post-traumatic wound infection 05/13/2012  . Toe osteomyelitis, left ([place]) 05/31/2012  . Adult ADHD 08/09/2012  . Neuropathy 03/25/2013  . Cataract  of both eyes 09/13/2013  . Mood disorder ([place]) 09/13/2013  . GAD (generalized anxiety disorder) 09/13/2013  . 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 ([place]) 09/13/2013  . 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 Vitreous hemorrhage of left eye ([place]) 04/26/2014  . 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 Right foot pain 02/12/2015  . Poor dentition 02/12/2015  . 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  . Left foot pain 02/13/2015  . DDD (degenerative disc disease), lumbar 02/13/2015  . Swelling of lower extremity 07/13/2015  . Pure hypercholesterolemia 05/22/2016  . BMI 31.0-31.9,adult 11/20/2016  . Lower extremity edema 05/20/2017  . Sinus pressure 08/24/2017   Resolved Ambulatory Problems    Diagnosis Date Noted  . URI 07/03/2009  . TRIGGER FINGER, LEFT THUMB 05/14/2009  . FOOT SPRAIN, RIGHT 02/24/2010  . FOOT INJURY, RIGHT 02/24/2010  . ADHD 03/19/2010  . Acute bronchitis 10/06/2010  . Cough 10/06/2010  . Wound, open, leg 12/25/2011  . Gas gangrene of extremity ([place]) 02/05/2012  . Cellulitis of leg, left 05/13/2012   Past Medical History:  Diagnosis Date  . Depression   . Diabetes mellitus 3-10  . DM type 2 causing CKD stage 3 ([place])   . PTSD (post-traumatic stress disorder)    reveiwed med, allergy, problem list.   Observations/Objective: No acute distress. No labored breathing.  Normal mood.   .. Today's Vitals   10/13/18 1401  BP: 126/72  Pulse: 87  Resp: 17  Temp: 98.3 F (36.8 C)  TempSrc: Oral  Weight: 158 lb (71.7 kg)  Height: 5\' 4"  (1.626 m)   Body mass index is 27.12 kg/m.  .. [facility] 10/13/2018 06/14/2018 08/24/2017 11/18/2016 06/21/2012  Decreased Interest 0 2 1 2  0  Down, Depressed, Hopeless 1 0 1 2 0  PHQ - 2 Score 1 2 2 4  0  Altered sleeping 3 3 0 3 -  Tired, decreased energy 1 2 1 1  -  Change in appetite 0 0 0 0 -   Feeling bad or failure about yourself  1 0 1 1 -  Trouble concentrating 3 0 1 1 -  Moving slowly or fidgety/restless 1 0 0 1 -  Suicidal thoughts 0 0 0 0 -  PHQ-9 Score 10 7 5 11  -  Difficult doing work/chores Very difficult Somewhat difficult Somewhat difficult - -   .[name]. GAD 7 : Generalized Anxiety Score 10/13/2018 06/14/2018 08/24/2017  Nervous, Anxious, on Edge 3 3 1   Control/stop worrying 3 1 1   Worry too much - different things 3 0 1  Trouble relaxing 1 1 1   Restless 1 0 0  Easily annoyed or irritable 3 1 1   Afraid - awful might happen 1 0 0  Total GAD 7 Score 15 6 5   Anxiety Difficulty Very difficult Somewhat difficult Somewhat difficult      Assessment and Plan: [name].[name].Melissa [name] was seen today for diabetes and adhd.  Diagnoses and all orders for this visit:  Primary insomnia -     amitriptyline (ELAVIL) 10 MG tablet; TAKE 4 TABLETS AS NEEDED FOR INSOMNIA  Medication refill -     triamcinolone cream (KENALOG) 0.1 %; Apply 1 application topically 2 (two) times daily. 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  Neuropathy  Eczema, unspecified type -     triamcinolone cream (KENALOG) 0.1 %; Apply 1 application topically 2 (two) times daily.  MDD (major depressive disorder), recurrent episode, moderate (HCC)  Adult ADHD   Refilled medications needed.   Need copy of A!C!!pt reports she will get me copy. Need up to date eye exam. She is followed closely for this.  On ACE. On ARB. Not smoker. Need eye exam report.  Pt see podiatry.  Vaccines up to date.  Will get flu shot when in town.  On insulin.   Will send Gvoke pen to use for hypoglycemic events.   vyvanse given for 3 months. 90 days supply.    Discussed mood. Will continue to monitor. Discussed things she can do at home. No medication changes. Strongly encouraged sunlight!!!  Follow up in 3 months.    Follow Up Instructions:    I discussed the assessment and treatment plan with the patient. The patient was provided an opportunity to ask questions and all were answered. The patient agreed with the plan and demonstrated an understanding of the instructions.   The patient was advised to call back or seek an in-person evaluation if the symptoms worsen or if the condition fails to improve as anticipated.     Tandy GawJade Breeback, PA-C

## 2018-10-13 NOTE — Progress Notes (Signed)
Had surgery on leg in May, having issues with eczema. Needs refill med.   Wants to discuss switching medication: glucagon to gvoke  Wants to discuss Omnipod insulin delivery system

## 2018-10-15 ENCOUNTER — Encounter: Payer: Self-pay | Admitting: Physician Assistant

## 2018-10-15 ENCOUNTER — Other Ambulatory Visit: Payer: Self-pay | Admitting: Physician Assistant

## 2018-10-15 DIAGNOSIS — F909 Attention-deficit hyperactivity disorder, unspecified type: Secondary | ICD-10-CM

## 2018-10-15 DIAGNOSIS — Z760 Encounter for issue of repeat prescription: Secondary | ICD-10-CM

## 2018-10-15 MED ORDER — LISDEXAMFETAMINE DIMESYLATE 70 MG PO CAPS: 70.0000 mg | ORAL_CAPSULE | ORAL | 0 refills | Status: DC

## 2018-10-15 NOTE — Telephone Encounter (Signed)
Pt requested 90 day. Can you please send to pharmacy. Visit this week.

## 2018-10-20 ENCOUNTER — Other Ambulatory Visit: Payer: Self-pay

## 2018-10-20 DIAGNOSIS — Z760 Encounter for issue of repeat prescription: Secondary | ICD-10-CM

## 2018-10-20 DIAGNOSIS — F909 Attention-deficit hyperactivity disorder, unspecified type: Secondary | ICD-10-CM

## 2018-10-20 MED ORDER — LISDEXAMFETAMINE DIMESYLATE 70 MG PO CAPS: 70.0000 mg | ORAL_CAPSULE | ORAL | 0 refills | Status: DC

## 2018-10-20 NOTE — Telephone Encounter (Signed)
[facility] Wilford Corner I can not send 90) 5 days ago.

## 2018-10-20 NOTE — Telephone Encounter (Signed)
Davionna needs her Vyvanse to go to [facility]. [facility] and they [facility]'t [facility] any [facility] for [place].

## 2018-10-21 ENCOUNTER — Telehealth: Payer: Self-pay | Admitting: Family Medicine

## 2018-10-21 NOTE — Telephone Encounter (Signed)
Received a fax that Vyvanse needs PA. Waiting on provider signature.

## 2018-10-22 NOTE — Telephone Encounter (Signed)
Received fax from insurance that Vyvanse is approved and pharmacy is aware. Form sent to scan.

## 2018-10-29 ENCOUNTER — Telehealth: Payer: Self-pay | Admitting: Neurology

## 2018-10-29 NOTE — Telephone Encounter (Signed)
Received note from [facility] that they are temporarily out of stock of Gvoke hypopen. Per Luvenia Starch let patient know.  Left message on machine for patient to call back to make her aware.

## 2018-11-02 ENCOUNTER — Other Ambulatory Visit: Payer: Self-pay | Admitting: Neurology

## 2018-11-02 DIAGNOSIS — Z760 Encounter for issue of repeat prescription: Secondary | ICD-10-CM

## 2018-11-02 MED ORDER — ZOLPIDEM TARTRATE ER 12.5 MG PO TBCR: EXTENDED_RELEASE_TABLET | ORAL | 1 refills | Status: DC

## 2018-11-02 NOTE — Telephone Encounter (Signed)
Last filled 02/12/2018 #90 with one refill. Patient uses PRN.

## 2018-12-01 ENCOUNTER — Other Ambulatory Visit: Payer: Self-pay | Admitting: Physician Assistant

## 2018-12-01 DIAGNOSIS — F331 Major depressive disorder, recurrent, moderate: Secondary | ICD-10-CM

## 2018-12-01 DIAGNOSIS — F411 Generalized anxiety disorder: Secondary | ICD-10-CM

## 2018-12-01 DIAGNOSIS — F39 Unspecified mood [affective] disorder: Secondary | ICD-10-CM

## 2018-12-02 NOTE — Telephone Encounter (Signed)
Please confirm that pt is on both

## 2019-01-24 ENCOUNTER — Other Ambulatory Visit: Payer: Self-pay | Admitting: Neurology

## 2019-01-24 MED ORDER — DEXCOM G6 TRANSMITTER MISC: 1.0000 | 3 refills | Status: DC

## 2019-03-07 ENCOUNTER — Other Ambulatory Visit: Payer: Self-pay | Admitting: Physician Assistant

## 2019-03-07 DIAGNOSIS — F5101 Primary insomnia: Secondary | ICD-10-CM

## 2019-03-07 DIAGNOSIS — Z760 Encounter for issue of repeat prescription: Secondary | ICD-10-CM

## 2019-03-07 NOTE — Telephone Encounter (Signed)
Has appointment with you tomorrow. KG LPN

## 2019-03-08 ENCOUNTER — Ambulatory Visit (INDEPENDENT_AMBULATORY_CARE_PROVIDER_SITE_OTHER): Payer: BC Managed Care – PPO | Admitting: Physician Assistant

## 2019-03-08 ENCOUNTER — Encounter: Payer: Self-pay | Admitting: Physician Assistant

## 2019-03-08 VITALS — BP 128/80 | Ht 64.0 in | Wt 158.0 lb

## 2019-03-08 DIAGNOSIS — Z760 Encounter for issue of repeat prescription: Secondary | ICD-10-CM | POA: Diagnosis not present

## 2019-03-08 DIAGNOSIS — F909 Attention-deficit hyperactivity disorder, unspecified type: Secondary | ICD-10-CM

## 2019-03-08 DIAGNOSIS — F5101 Primary insomnia: Secondary | ICD-10-CM | POA: Diagnosis not present

## 2019-03-08 NOTE — Progress Notes (Signed)
Patient ID: Melissa [name], female   DOB: [birth year], 49 y.o.   MRN: [id number] .[name] KitchenVirtual Visit via Telephone Note  I connected with Melissa [name] on 03/08/2019 at  3:20 PM EST by telephone and verified that I am speaking with the correct person using two identifiers.  Location: Patient: home Provider: clinic   I discussed the limitations, risks, security and privacy concerns of performing an evaluation and management service by telephone and the availability of in person appointments. I also discussed with the patient that there may be a patient responsible charge related to this service. The patient expressed understanding and agreed to proceed.   History of Present Illness: Pt is a 50 yo female who calls into the clinic for medication refills for insomnia and ADHD.   She is doing well. She just had foot surgery and non weight bearing right now. She is sleeping and doing well. She just needs refills.   .. Active Ambulatory Problems    Diagnosis Date Noted  . Obesity 11/23/2009  . Anxiety 11/12/2006  . INSOMNIA, CHRONIC 12/16/2006  . MDD (major depressive disorder), recurrent episode, moderate ([place]) 11/12/2006  . Essential hypertension, benign 11/23/2009  . Non-seasonal allergic rhinitis due to pollen 07/03/2009  . WEIGHT GAIN 11/12/2006  . LUMBAGO 03/19/2010  . Eczema 03/26/2011  . Carpal tunnel syndrome 03/26/2011  . Anemia 02/05/2012  . Post-traumatic wound infection 05/13/2012  . Toe osteomyelitis, left ([place]) 05/31/2012  . Adult ADHD 08/09/2012  . Neuropathy 03/25/2013  . Cataract of both eyes 09/13/2013  . Mood disorder ([place]) 09/13/2013  . GAD (generalized anxiety disorder) 09/13/2013  . 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 ([place]) 09/13/2013  . 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 Vitreous hemorrhage of left eye ([place]) 04/26/2014  . 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 Right foot pain 02/12/2015  . Poor dentition 02/12/2015  . 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  . Left foot pain 02/13/2015  . DDD (degenerative disc disease), lumbar 02/13/2015  . Swelling of lower extremity 07/13/2015  . Pure hypercholesterolemia 05/22/2016  . BMI 31.0-31.9,adult 11/20/2016  . Lower extremity edema 05/20/2017  . Sinus pressure 08/24/2017   Resolved Ambulatory Problems    Diagnosis Date Noted  . URI 07/03/2009  . TRIGGER FINGER, LEFT THUMB 05/14/2009  . FOOT SPRAIN, RIGHT 02/24/2010  . FOOT INJURY, RIGHT 02/24/2010  . ADHD 03/19/2010  . Acute bronchitis 10/06/2010  . Cough 10/06/2010  . Wound, open, leg 12/25/2011  . Gas gangrene of extremity ([place]) 02/05/2012  . Cellulitis of leg, left 05/13/2012   Past Medical History:  Diagnosis Date  . Depression   . Diabetes mellitus 3-10  . DM type 2 causing CKD stage 3 ([place])   . PTSD (post-traumatic stress disorder)    Reviewed med, allergy, problem list.    Observations/Objective: No acute distress.  Normal mood and appearance.  Normal breathing.   .. Today's Vitals   03/08/19 1447  BP: 128/80  Weight: 158 lb (71.7 kg)  Height: 5\' 4"  (1.626 m)   Body mass index is 27.12 kg/m.    Assessment and Plan: [name] KitchenMarland KitchenDeanna was seen today for follow-up.  Diagnoses and all orders for this visit:  Adult ADHD  Medication refill -     amitriptyline (ELAVIL) 10 MG tablet; 4 tablets at bedtime PRN insomnia  Primary insomnia -     amitriptyline (ELAVIL) 10 MG tablet; 4 tablets at bedtime PRN  insomnia   Refilled medication.  Follow up in 3 months.    Follow Up Instructions:    I discussed the assessment and treatment plan with the patient. The patient was provided an opportunity to ask questions and all were answered. The patient agreed with the plan and demonstrated an understanding of the instructions.   The patient was advised to call back or seek an in-person evaluation if the  symptoms worsen or if the condition fails to improve as anticipated.   Tandy Gaw, PA-C

## 2019-03-08 NOTE — Progress Notes (Signed)
Patient needs refills. Wants to update on foot surgery.  PHQ9 (8) -GAD7 (8) completed.

## 2019-03-13 ENCOUNTER — Other Ambulatory Visit: Payer: Self-pay | Admitting: Physician Assistant

## 2019-03-13 DIAGNOSIS — Z760 Encounter for issue of repeat prescription: Secondary | ICD-10-CM

## 2019-03-13 DIAGNOSIS — F909 Attention-deficit hyperactivity disorder, unspecified type: Secondary | ICD-10-CM

## 2019-03-13 DIAGNOSIS — F5101 Primary insomnia: Secondary | ICD-10-CM | POA: Insufficient documentation

## 2019-03-13 MED ORDER — AMITRIPTYLINE HCL 10 MG PO TABS: ORAL_TABLET | ORAL | 3 refills | Status: DC

## 2019-03-13 NOTE — Telephone Encounter (Signed)
Needs 90 day [facility]. Followed up last week with virtual appt.

## 2019-03-14 MED ORDER — LISDEXAMFETAMINE DIMESYLATE 70 MG PO CAPS: 70.0000 mg | ORAL_CAPSULE | ORAL | 0 refills | Status: DC

## 2019-03-17 ENCOUNTER — Other Ambulatory Visit: Payer: Self-pay

## 2019-03-17 ENCOUNTER — Telehealth: Payer: Self-pay | Admitting: Physician Assistant

## 2019-03-17 DIAGNOSIS — E1043 Type 1 diabetes mellitus with diabetic autonomic (poly)neuropathy: Secondary | ICD-10-CM

## 2019-03-17 MED ORDER — DEXCOM G6 SENSOR MISC: 1.0000 | Freq: Once | 2 refills | Status: AC

## 2019-03-17 NOTE — Telephone Encounter (Signed)
Received fax from [employer] that Dexcom meter requires a PA. Information has been sent to the insurance company. Awaiting determination.

## 2019-03-21 ENCOUNTER — Other Ambulatory Visit: Payer: Self-pay

## 2019-03-21 DIAGNOSIS — E103593 Type 1 diabetes mellitus with proliferative diabetic retinopathy without macular edema, bilateral: Secondary | ICD-10-CM

## 2019-03-21 LAB — HEMOGLOBIN A1C: Hemoglobin A1C: 6.9

## 2019-03-21 MED ORDER — INSULIN LISPRO (1 UNIT DIAL) 100 UNIT/ML (KWIKPEN): PEN_INJECTOR | SUBCUTANEOUS | 11 refills | Status: DC

## 2019-03-21 NOTE — Telephone Encounter (Signed)
Shalita called and requested a refill on the KwikPen Humalog. She states her A1C is 6.9 % today per her continuous monitor. This medication has not been filled in over a year. Pended refill. Needs to be filled with [facility].

## 2019-03-22 ENCOUNTER — Other Ambulatory Visit: Payer: Self-pay

## 2019-03-22 DIAGNOSIS — E103593 Type 1 diabetes mellitus with proliferative diabetic retinopathy without macular edema, bilateral: Secondary | ICD-10-CM

## 2019-03-22 MED ORDER — INSULIN LISPRO (1 UNIT DIAL) 100 UNIT/ML (KWIKPEN): PEN_INJECTOR | SUBCUTANEOUS | 3 refills | Status: DC

## 2019-04-08 NOTE — Telephone Encounter (Signed)
Not covered by insurance.

## 2019-05-02 ENCOUNTER — Encounter: Payer: Self-pay | Admitting: Physician Assistant

## 2019-05-02 DIAGNOSIS — R600 Localized edema: Secondary | ICD-10-CM

## 2019-05-02 DIAGNOSIS — Z760 Encounter for issue of repeat prescription: Secondary | ICD-10-CM

## 2019-05-02 DIAGNOSIS — E1043 Type 1 diabetes mellitus with diabetic autonomic (poly)neuropathy: Secondary | ICD-10-CM

## 2019-05-02 DIAGNOSIS — F419 Anxiety disorder, unspecified: Secondary | ICD-10-CM

## 2019-05-02 MED ORDER — DEXCOM G6 TRANSMITTER MISC: 1.0000 | 1 refills | Status: DC

## 2019-05-02 MED ORDER — BD PEN NEEDLE MINI U/F 31G X 5 MM MISC: 3 refills | Status: DC

## 2019-05-02 MED ORDER — HYDROCHLOROTHIAZIDE 12.5 MG PO CAPS: 25.0000 mg | ORAL_CAPSULE | Freq: Every day | ORAL | 0 refills | Status: DC

## 2019-05-02 NOTE — Telephone Encounter (Signed)
Sent RX for needles, dexcom sensors, HCTZ  Others pended for your review.   Xanax - last RX 08/18/2017 #270 with 1 refill  Lasix - last RX 02/18/2018 #90 with 1 refill  Tramadol - 02/18/2018 #360 with 1 refill  Ambien - 11/02/2018 #90 with 1 refill  Not sure about Diflucan?

## 2019-05-03 MED ORDER — TRAMADOL HCL 50 MG PO TABS: ORAL_TABLET | ORAL | 1 refills | Status: DC

## 2019-05-03 MED ORDER — ALPRAZOLAM 1 MG PO TABS: ORAL_TABLET | ORAL | 1 refills | Status: DC

## 2019-05-03 MED ORDER — ZOLPIDEM TARTRATE ER 12.5 MG PO TBCR: EXTENDED_RELEASE_TABLET | ORAL | 1 refills | Status: DC

## 2019-05-03 MED ORDER — FUROSEMIDE 20 MG PO TABS: ORAL_TABLET | ORAL | 1 refills | Status: DC

## 2019-05-03 NOTE — Telephone Encounter (Signed)
Can you print form for me to feel out?

## 2019-05-05 ENCOUNTER — Telehealth: Payer: Self-pay

## 2019-05-05 DIAGNOSIS — F419 Anxiety disorder, unspecified: Secondary | ICD-10-CM

## 2019-05-05 DIAGNOSIS — R600 Localized edema: Secondary | ICD-10-CM

## 2019-05-05 DIAGNOSIS — Z760 Encounter for issue of repeat prescription: Secondary | ICD-10-CM

## 2019-05-05 MED ORDER — DEXCOM G6 TRANSMITTER MISC: 1.0000 | 1 refills | Status: DC

## 2019-05-05 MED ORDER — FUROSEMIDE 20 MG PO TABS: ORAL_TABLET | ORAL | 1 refills | Status: DC

## 2019-05-05 NOTE — Telephone Encounter (Signed)
Melissa [name] called and left a message stating she needs the sensor and all the medications sent [facility].

## 2019-05-05 NOTE — Telephone Encounter (Signed)
Some medication went [facility], some [facility]. Not sure why. Can you resend these to [facility]?

## 2019-05-06 ENCOUNTER — Other Ambulatory Visit: Payer: Self-pay

## 2019-05-06 ENCOUNTER — Encounter: Payer: Self-pay | Admitting: Physician Assistant

## 2019-05-06 MED ORDER — ZOLPIDEM TARTRATE ER 12.5 MG PO TBCR: EXTENDED_RELEASE_TABLET | ORAL | 1 refills | Status: DC

## 2019-05-06 MED ORDER — ALPRAZOLAM 1 MG PO TABS: ORAL_TABLET | ORAL | 1 refills | Status: DC

## 2019-05-06 MED ORDER — DEXCOM G4 SENSOR MISC: 1.0000 | 99 refills | Status: DC

## 2019-05-06 MED ORDER — TRAMADOL HCL 50 MG PO TABS: ORAL_TABLET | ORAL | 1 refills | Status: DC

## 2019-05-06 NOTE — Telephone Encounter (Signed)
Done

## 2019-05-06 NOTE — Telephone Encounter (Signed)
Given to front desk to email to patient.  °

## 2019-05-09 NOTE — Telephone Encounter (Signed)
Done

## 2019-05-13 ENCOUNTER — Telehealth: Payer: Self-pay

## 2019-05-13 NOTE — Telephone Encounter (Signed)
Melissa [name] said it was in her save folder. Dom sent it should be in sent folder to resend.

## 2019-05-13 NOTE — Telephone Encounter (Signed)
Melissa [name], Oniyah's husband, called and left a message stating they never received the email for her to get the COVID vaccine. He wants [place] to send it to [email or web address]. I am unable to get up with the patient to see if it is ok to email to this address. I also do not see any paperwork in her chart.

## 2019-05-16 NOTE — Telephone Encounter (Signed)
Called pt and confirmed both emails, the first one is undeliverable so I also told her I would send it as a [facility] message and she said that was fine. If she has an issue she'll call back and ask for me

## 2019-05-18 ENCOUNTER — Telehealth: Payer: Self-pay | Admitting: Physician Assistant

## 2019-05-18 NOTE — Telephone Encounter (Signed)
Received faxed form for PA on Tramadol filled out and faxed back to [facility] waiting on determination. - CF

## 2019-05-19 NOTE — Telephone Encounter (Signed)
Received fax from [facility] and they approved coverage on Tramadol  CaseId: [id number] Valid: 04/18/2019 - 05/17/2020 - CF

## 2019-06-24 ENCOUNTER — Other Ambulatory Visit: Payer: Self-pay | Admitting: Family Medicine

## 2019-06-24 ENCOUNTER — Encounter: Payer: Self-pay | Admitting: Physician Assistant

## 2019-06-24 ENCOUNTER — Other Ambulatory Visit: Payer: Self-pay | Admitting: Physician Assistant

## 2019-06-24 DIAGNOSIS — Z760 Encounter for issue of repeat prescription: Secondary | ICD-10-CM

## 2019-06-24 DIAGNOSIS — F909 Attention-deficit hyperactivity disorder, unspecified type: Secondary | ICD-10-CM

## 2019-06-24 MED ORDER — HYDROCHLOROTHIAZIDE 12.5 MG PO CAPS: 25.0000 mg | ORAL_CAPSULE | Freq: Every day | ORAL | 0 refills | Status: DC

## 2019-06-24 NOTE — Telephone Encounter (Signed)
Scheduled to see [facility] 5/17/  Requesting RF   90 day RX sent 03/14/19

## 2019-06-27 MED ORDER — LISDEXAMFETAMINE DIMESYLATE 70 MG PO CAPS: 70.0000 mg | ORAL_CAPSULE | ORAL | 0 refills | Status: DC

## 2019-06-28 ENCOUNTER — Encounter: Payer: Self-pay | Admitting: Nurse Practitioner

## 2019-06-28 ENCOUNTER — Telehealth (INDEPENDENT_AMBULATORY_CARE_PROVIDER_SITE_OTHER): Payer: BC Managed Care – PPO | Admitting: Nurse Practitioner

## 2019-06-28 VITALS — BP 128/72 | HR 86 | Ht 64.0 in | Wt 158.0 lb

## 2019-06-28 DIAGNOSIS — Z760 Encounter for issue of repeat prescription: Secondary | ICD-10-CM

## 2019-06-28 DIAGNOSIS — R600 Localized edema: Secondary | ICD-10-CM

## 2019-06-28 DIAGNOSIS — F909 Attention-deficit hyperactivity disorder, unspecified type: Secondary | ICD-10-CM | POA: Diagnosis not present

## 2019-06-28 DIAGNOSIS — I10 Essential (primary) hypertension: Secondary | ICD-10-CM

## 2019-06-28 DIAGNOSIS — B377 Candidal sepsis: Secondary | ICD-10-CM

## 2019-06-28 DIAGNOSIS — E1043 Type 1 diabetes mellitus with diabetic autonomic (poly)neuropathy: Secondary | ICD-10-CM

## 2019-06-28 DIAGNOSIS — E7800 Pure hypercholesterolemia, unspecified: Secondary | ICD-10-CM

## 2019-06-28 DIAGNOSIS — F3340 Major depressive disorder, recurrent, in remission, unspecified: Secondary | ICD-10-CM

## 2019-06-28 DIAGNOSIS — F411 Generalized anxiety disorder: Secondary | ICD-10-CM

## 2019-06-28 DIAGNOSIS — F5101 Primary insomnia: Secondary | ICD-10-CM

## 2019-06-28 MED ORDER — BUPROPION HCL ER (XL) 150 MG PO TB24: 150.0000 mg | ORAL_TABLET | Freq: Every day | ORAL | 1 refills | Status: DC

## 2019-06-28 MED ORDER — ATORVASTATIN CALCIUM 80 MG PO TABS: 80.0000 mg | ORAL_TABLET | Freq: Every day | ORAL | 1 refills | Status: DC

## 2019-06-28 MED ORDER — BD PEN NEEDLE MINI U/F 31G X 5 MM MISC: 3 refills | Status: DC

## 2019-06-28 MED ORDER — LISINOPRIL 5 MG PO TABS: 5.0000 mg | ORAL_TABLET | Freq: Every day | ORAL | 1 refills | Status: DC

## 2019-06-28 MED ORDER — DEXCOM G4 SENSOR MISC: 1.0000 | 1 refills | Status: DC

## 2019-06-28 MED ORDER — DEXCOM G6 TRANSMITTER MISC: 1.0000 [IU] | 3 refills | Status: DC

## 2019-06-28 MED ORDER — BUPROPION HCL ER (XL) 300 MG PO TB24: 300.0000 mg | ORAL_TABLET | Freq: Every day | ORAL | 1 refills | Status: DC

## 2019-06-28 MED ORDER — ALPRAZOLAM 1 MG PO TABS: ORAL_TABLET | ORAL | 1 refills | Status: DC

## 2019-06-28 MED ORDER — ZOLPIDEM TARTRATE ER 12.5 MG PO TBCR: EXTENDED_RELEASE_TABLET | ORAL | 1 refills | Status: DC

## 2019-06-28 MED ORDER — AFREZZA 4 UNITS IN POWD: RESPIRATORY_TRACT | 5 refills | Status: DC

## 2019-06-28 MED ORDER — FUROSEMIDE 20 MG PO TABS: ORAL_TABLET | ORAL | 1 refills | Status: DC

## 2019-06-28 MED ORDER — LISDEXAMFETAMINE DIMESYLATE 70 MG PO CAPS: 70.0000 mg | ORAL_CAPSULE | ORAL | 0 refills | Status: DC

## 2019-06-28 MED ORDER — DEXCOM G6 SENSOR MISC: 1 refills | Status: DC

## 2019-06-28 MED ORDER — FLUCONAZOLE 150 MG PO TABS: 150.0000 mg | ORAL_TABLET | Freq: Every day | ORAL | 1 refills | Status: DC

## 2019-06-28 MED ORDER — AMITRIPTYLINE HCL 10 MG PO TABS: ORAL_TABLET | ORAL | 1 refills | Status: DC

## 2019-06-28 NOTE — Progress Notes (Signed)
Fast acting insulin - sometimes sugar bottoms out Wants to discuss inhaled insulin instead  Mychart message from patient:   Monday's video appt topics.  Surgical progress on left foot and ankle.  The area is recovering slower than I had hoped.  Minor set back inflammation. Of ankle joint and pain with any motion.  I was put back on no weight bearing 6 weeks ago and was referee to prosthetic in [place].  I have custom molded brace now.  I also developed a large hematoma on my left inner calf that had to be lanced and now has daily dressing changes.  I am scheduled to have new X-rays in 2 weeks.  My blood sugar continues reading is averaging 6.7 mg/dL.  Recent eye showed no signs of retinopathy.  No changes in vision.  Recent Mammogram on June 03, 2019 negatieve and recent pap was negative was completed on June 03, 2019 was negative in addition to occult blood test.  Records will be sent electronically.    I received my second Covid shot on June 16, 2019 so I will be able to fly by August for next appt   I want to change 2 of diabetic medications if you agree.  I love my Dexcom and I love not sticking my finger so I want to switch to the inhaled fast acting insulin to the inhaled version I used at the hospital with my surgery.  It prevents hypoglycemic crash's.  I also want to change my glucagon to a nasal spray version of glucagon to a nasal spray treatment used at the surgical system.    Changing Humalog to Afrezza inhaled insulin to cut down injections.  I used this this when I had my last surgery.  It only last 90 min  preventing crash's.   4u if blood sugar is 150 to 200 mg/dL post meal  8u if blood sugar is over 200 mg/dL post meal  BAQSIMI nasal spray to replace glucagon   Medication refills [facility] for [facility] day supply.    Refills needed on:  Ambien ER 12.5 mg q x 90 days   Xanax 1 mg 3x QD x 90 days   Amitriptyline 10 mg 1 po 4 times QD X  90 days  Lasix 10 mg PRN ankle swelling  HCTZ 25 mg QD x 90 day  Vyvanse 75 mg QD x 90 days  Wellbutrin 300 mg 1 QD x 90 days   Wellbutrin 150 mg QD x 90 days  Lisinopril 5 mg QD x 90 days   Atorvastatin 80 mg x 90 days   Fluconazole PRN  Dexcom G6 Transmitter 1 per 90 days   Dexcom G6 Sensor 3 per month or 9 per 90 days   Insulin pen tips X90 days

## 2019-06-28 NOTE — Progress Notes (Signed)
Virtual Visit via MyChart Note  I connected with  Melissa [name] on 06/28/19 at  1:10 PM EDT by the video enabled telemedicine application, MyChart, and verified that I am speaking with the correct person using two identifiers.   I introduced myself as a Publishing rights manager with the practice. We discussed the limitations of evaluation and management by telemedicine and the availability of in person appointments. The patient expressed understanding and agreed to proceed.  The patient is: at home I am: in the office  Subjective:    CC: medication refills  HPI: Melissa [name] is a 49 y.o. y/o female presenting via MyChart today for refills on current medications and discuss changing her medication regimen for her type 1 DM.    She does need refills on multiple medications today including alprazolam, Elavil, Lipitor, Wellbutrin, Lasix, Vyvanse, Zestril, and Ambien.  She reports she is doing well on these medications and is taking them as prescribed. She reports her mood is well controlled with daily Wellbutrin and as needed alprazolam.  She denies significant anxiety or depressive symptoms. She reports she is sleeping well with Elavil nightly and Ambien as needed.  She denies daytime sleepiness, confusion, or disruption of her daytime habits with the medications. She reports improved focus and attention with daily Vyvanse.  She denies palpitations, shortness of breath, or increased anxiety with medication.  DIABETES She reports she recently had surgery on her foot and was given inhaled insulin before and after the procedure. She felt that the insulin did an excellent job at managing her blood sugar and prevented her from have hypoglycemic episodes. She reports she was given dosages of 4 units for blood sugars of 150-200 and 8 units for blood sugars >200. She would like to have her rapid acting insulin changed to this regimen, as she feels she will be less likely to need to utilize her glucagon with  this.   She is currently utilizing the Dexcom for continuous blood glucose monitoring and reports this has been a life changing addition to her diabetes management. She is requesting refills on the sensor and transmitters today. She utilizes 3 sensors a month (1 new sensor every 11 days) and 1 transmitter every 6 months.   She recently had surgery on her foot for an ongoing issue and is still experiencing difficulty with this. She reports that she has been on multiple antibiotics and has been experiencing frequent yeast infections to the skin, mouth, and vaginal area. She has successfully utilized diflucan in the past and is requesting this to be called in, as well.   She splits her time between here and [place] and is currently in [place] and will be until the issues with her foot and leg are resolved.  Past medical history, Surgical history, Family history not pertinant except as noted below, Social history, Allergies, and medications have been entered into the medical record, reviewed, and corrections made.   Review of Systems:  See HPI for pertinent positives and negatives.  Objective:    General: Speaking clearly in complete sentences without any shortness of breath.  Alert and oriented x3.  Normal judgment. No apparent acute distress.  Impression and Recommendations:    1. 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  Dexcom continuous blood glucose monitor has had a significant impact on her quality of life for diabetes management. Per patient's request we will  change her current regular insulin to the inhaled regular insulin. Refills provided for Dexcom sensor and transmitter along with insulin needles. Follow-up in approximately 6 months. - lisinopril (ZESTRIL) 5 MG tablet; Take 1 tablet (5 mg total) by mouth daily.  Dispense: 90 tablet; Refill: 1 -  Continuous Blood Gluc Sensor (DEXCOM G4 SENSOR) MISC; 1 each by Does not apply route as directed.  Dispense: 9 each; Refill: 1 - Insulin Pen Needle (B-D UF III MINI PEN NEEDLES) 31G X 5 MM MISC; USE EVERY NIGHT AS DIRECTED WITH INSULIN PENS  Dispense: 270 each; Refill: 3 - Insulin Regular Human (AFREZZA) 4 units POWD; Inhale 4 units for blood sugar 150-200 mg/dL after meals OR 8 units for blood sugar greater than 200 mg/dL after meals three times a day.  Dispense: 180 each; Refill: 5 - Continuous Blood Gluc Transmit (DEXCOM G6 TRANSMITTER) MISC; 1 Units by Does not apply route every 3 (three) months.  Dispense: 1 each; Refill: 3 - Continuous Blood Gluc Sensor (DEXCOM G6 SENSOR) MISC; Change sensor every 11 days.  Dispense: 9 each; Refill: 1  2. Adult ADHD Adult ADD well controlled with daily Vyvanse.  No changes to medication management at this time. 90-day prescription sent to pharmacy. - lisdexamfetamine (VYVANSE) 70 MG capsule; Take 1 capsule (70 mg total) by mouth every morning.  Dispense: 90 capsule; Refill: 0  3. GAD (generalized anxiety disorder) Generalized anxiety disorder well-controlled with daily Wellbutrin and as needed alprazolam.  No medication changes required at this time. Refills provided and sent to pharmacy. - buPROPion (WELLBUTRIN XL) 300 MG 24 hr tablet; Take 1 tablet (300 mg total) by mouth daily.  Dispense: 90 tablet; Refill: 1 - buPROPion (WELLBUTRIN XL) 150 MG 24 hr tablet; Take 1 tablet (150 mg total) by mouth daily.  Dispense: 90 tablet; Refill: 1 - ALPRAZolam (XANAX) 1 MG tablet; TAKE 1 TABLET BY MOUTH 3 TIMES A DAY  Dispense: 270 tablet; Refill: 1  4. Recurrent major depressive disorder, in remission ([place]) Major depressive disorder currently well controlled with current dose of Wellbutrin.  No changes to medication made today. Refills provided. - buPROPion (WELLBUTRIN XL) 300 MG 24 hr tablet; Take 1 tablet (300 mg total) by mouth daily.  Dispense: 90 tablet; Refill:  1 - buPROPion (WELLBUTRIN XL) 150 MG 24 hr tablet; Take 1 tablet (150 mg total) by mouth daily.  Dispense: 90 tablet; Refill: 1  5. Essential hypertension, benign Hypertension well controlled with 5 mg of lisinopril daily.  Current blood pressure is within normal limits. No changes to medications today.  Refills provided. - lisinopril (ZESTRIL) 5 MG tablet; Take 1 tablet (5 mg total) by mouth daily.  Dispense: 90 tablet; Refill: 1  6. Primary insomnia Primary insomnia well controlled with daily amitriptyline and as needed zolpidem.  No changes to medication today. Refills provided. - zolpidem (AMBIEN CR) 12.5 MG CR tablet; TAKE 1 TABLET BY MOUTH AT BEDTIME  Dispense: 90 tablet; Refill: 1 - amitriptyline (ELAVIL) 10 MG tablet; 4 tablets at bedtime PRN insomnia  Dispense: 320 tablet; Refill: 1  7. Lower extremity edema Lower extremity edema well controlled with 20 mg of Lasix as needed. No changes to medications today. Refills provided. - furosemide (LASIX) 20 MG tablet; TAKE 1 TABLET (20 MG TOTAL) BY MOUTH DAILY AS NEEDED FOR EDEMA.  Dispense: 90 tablet; Refill: 1  8. Pure hypercholesterolemia History of hypercholesterolemia.  Well-controlled with atorvastatin. No changes to medication today. Refills provided. - atorvastatin (LIPITOR) 80 MG tablet; Take 1  tablet (80 mg total) by mouth daily.  Dispense: 90 tablet; Refill: 1  9. Candidiasis, systemic (HCC) Recurrent candidiasis related to frequent antibiotic use due to lower extremity infection.  Refills provided today. - fluconazole (DIFLUCAN) 150 MG tablet; Take 1 tablet (150 mg total) by mouth daily.  Dispense: 9 tablet; Refill: 1  10. Medication refill Medications refills provided.  No questions by the patient at this time.  Plan to follow-up in approximately 6 months or sooner if needed.  Patient will need labs at next visit when she has returned back to [place]. - zolpidem (AMBIEN CR) 12.5 MG CR tablet; TAKE 1 TABLET BY  MOUTH AT BEDTIME  Dispense: 90 tablet; Refill: 1 - ALPRAZolam (XANAX) 1 MG tablet; TAKE 1 TABLET BY MOUTH 3 TIMES A DAY  Dispense: 270 tablet; Refill: 1 - amitriptyline (ELAVIL) 10 MG tablet; 4 tablets at bedtime PRN insomnia  Dispense: 320 tablet; Refill: 1 - furosemide (LASIX) 20 MG tablet; TAKE 1 TABLET (20 MG TOTAL) BY MOUTH DAILY AS NEEDED FOR EDEMA.  Dispense: 90 tablet; Refill: 1 - buPROPion (WELLBUTRIN XL) 300 MG 24 hr tablet; Take 1 tablet (300 mg total) by mouth daily.  Dispense: 90 tablet; Refill: 1 - lisdexamfetamine (VYVANSE) 70 MG capsule; Take 1 capsule (70 mg total) by mouth every morning.  Dispense: 90 capsule; Refill: 0 - buPROPion (WELLBUTRIN XL) 150 MG 24 hr tablet; Take 1 tablet (150 mg total) by mouth daily.  Dispense: 90 tablet; Refill: 1 - lisinopril (ZESTRIL) 5 MG tablet; Take 1 tablet (5 mg total) by mouth daily.  Dispense: 90 tablet; Refill: 1 - atorvastatin (LIPITOR) 80 MG tablet; Take 1 tablet (80 mg total) by mouth daily.  Dispense: 90 tablet; Refill: 1 - Continuous Blood Gluc Sensor (DEXCOM G4 SENSOR) MISC; 1 each by Does not apply route as directed.  Dispense: 9 each; Refill: 1 - Insulin Pen Needle (B-D UF III MINI PEN NEEDLES) 31G X 5 MM MISC; USE EVERY NIGHT AS DIRECTED WITH INSULIN PENS  Dispense: 270 each; Refill: 3 - Continuous Blood Gluc Transmit (DEXCOM G6 TRANSMITTER) MISC; 1 Units by Does not apply route every 3 (three) months.  Dispense: 1 each; Refill: 3 - Continuous Blood Gluc Sensor (DEXCOM G6 SENSOR) MISC; Change sensor every 11 days.  Dispense: 9 each; Refill: 1    I discussed the assessment and treatment plan with the patient. The patient was provided an opportunity to ask questions and all were answered. The patient agreed with the plan and demonstrated an understanding of the instructions.   The patient was advised to call back or seek an in-person evaluation if the symptoms worsen or if the condition fails to improve as anticipated.  I provided 20  minutes of non-face-to-face interaction with this MYCHART visit.   Tollie Eth, NP

## 2019-06-29 ENCOUNTER — Telehealth: Payer: Self-pay

## 2019-06-29 DIAGNOSIS — E1043 Type 1 diabetes mellitus with diabetic autonomic (poly)neuropathy: Secondary | ICD-10-CM

## 2019-06-29 DIAGNOSIS — Z760 Encounter for issue of repeat prescription: Secondary | ICD-10-CM

## 2019-06-29 MED ORDER — DEXCOM G6 SENSOR MISC: 1 refills | Status: DC

## 2019-06-29 NOTE — Telephone Encounter (Signed)
Clarification sent for Dexcom G6 with changing sensor every 10 days. Thank you

## 2019-06-29 NOTE — Telephone Encounter (Signed)
[place] from [facility] pharmacy called wanting clarification about some Rx's that were sent in yesterday.   There were scripts sent in for both the Dexcom G6 sensor and Dexcom G4-5 sensor. She said that the [facility] F4-5 sensor does not fit with the Dexcom G6 transmitter. She is wanting to know if they should delete the Rx for the Dexcom G4-5 and dispense the G6, or if a new Rx for a Dexcom G5 (only) that will fit the G6 transmitter will be sent in.   The Rx for the G6 sensor has a sig of "change sensor every 11 days" but the sensor is supposed to be changed every 10 days. She is requesing a new Rx to be sent in with the correct sig.   Mary Anne's direct phone number is: [phone] Reference #[id number]

## 2019-06-29 NOTE — Telephone Encounter (Signed)
Pharmacy aware that new Rx's have been sent in and to void the Rx that was written for the Dexcom G4-5.

## 2019-07-01 ENCOUNTER — Telehealth: Payer: Self-pay

## 2019-07-01 ENCOUNTER — Encounter: Payer: Self-pay | Admitting: Nurse Practitioner

## 2019-07-01 NOTE — Telephone Encounter (Signed)
Pharmacist with [facility] called and stated that her insurance company does not cover Afrezza. The alternative that is covered is Humalog.   [facility]: [id number] Reference #[id number]

## 2019-07-04 NOTE — Telephone Encounter (Signed)
Patient called, RX was only sent for 2 months. With mail order, she needs 3 months. OK to change?

## 2019-07-04 NOTE — Telephone Encounter (Signed)
I received fax for PA on Afrezza sent through cover my meds and received approval   Case ID [id number] Valid: 06/04/19 - 07/03/20. - CF

## 2019-07-04 NOTE — Telephone Encounter (Signed)
Yes- please go ahead and change to 90 day supply with 1 refill

## 2019-07-06 NOTE — Telephone Encounter (Signed)
Melissa [name]'s husband called and states a 90 day would be # 540.

## 2019-07-06 NOTE — Telephone Encounter (Signed)
Please let patient know that she really needs to have a spirometry which is a breathing test before we prescribe this medication is actually recommended by the package insert and the manufacturer.  We can certainly schedule her for spirometry here in the office beforehand.

## 2019-07-07 ENCOUNTER — Encounter: Payer: Self-pay | Admitting: Nurse Practitioner

## 2019-07-07 ENCOUNTER — Encounter: Payer: Self-pay | Admitting: Physician Assistant

## 2019-07-07 DIAGNOSIS — E1043 Type 1 diabetes mellitus with diabetic autonomic (poly)neuropathy: Secondary | ICD-10-CM

## 2019-07-07 MED ORDER — AFREZZA 4 UNITS IN POWD: RESPIRATORY_TRACT | 1 refills | Status: DC

## 2019-07-07 NOTE — Telephone Encounter (Signed)
Patient needs spirometry before starting medication please see additional phone note.

## 2019-07-07 NOTE — Telephone Encounter (Signed)
rx sent

## 2019-07-07 NOTE — Telephone Encounter (Signed)
See other MyChart message

## 2019-07-07 NOTE — Telephone Encounter (Signed)
Called and advised patient. Currently in [place] and cannot get spirometry done. Patient states she had one done in 2019. She will have that record faxed to our office. She is aware she cannot get this medication without spirometry

## 2019-07-11 ENCOUNTER — Telehealth: Payer: BC Managed Care – PPO | Admitting: Physician Assistant

## 2019-07-18 ENCOUNTER — Encounter: Payer: Self-pay | Admitting: Physician Assistant

## 2019-07-19 ENCOUNTER — Encounter: Payer: Self-pay | Admitting: Physician Assistant

## 2019-07-19 DIAGNOSIS — E1043 Type 1 diabetes mellitus with diabetic autonomic (poly)neuropathy: Secondary | ICD-10-CM

## 2019-07-22 MED ORDER — AFREZZA 4 & 8 & 12 UNITS IN POWD: RESPIRATORY_TRACT | 1 refills | Status: DC

## 2019-07-27 ENCOUNTER — Telehealth: Payer: Self-pay | Admitting: Neurology

## 2019-07-27 MED ORDER — AFREZZA 4 & 8 & 12 UNITS IN POWD: RESPIRATORY_TRACT | 1 refills | Status: DC

## 2019-07-27 NOTE — Telephone Encounter (Signed)
[facility] states inhaled insulin can not be dosed at 18 units. Okay to send up to 20 units per [facility]. RX resent.

## 2019-10-18 ENCOUNTER — Telehealth: Payer: Self-pay

## 2019-10-18 NOTE — Telephone Encounter (Signed)
Samantha from [facility] called requesting last visit notes for pt. Per Lelon Mast, pt is having surgery. She did not include what type of surgery pt was going to have. Last office notes faxed to [phone]. Confirmation rec'd.

## 2019-10-27 ENCOUNTER — Other Ambulatory Visit: Payer: Self-pay | Admitting: Physician Assistant

## 2019-10-27 ENCOUNTER — Encounter: Payer: Self-pay | Admitting: Physician Assistant

## 2019-10-27 DIAGNOSIS — E1043 Type 1 diabetes mellitus with diabetic autonomic (poly)neuropathy: Secondary | ICD-10-CM

## 2019-10-27 DIAGNOSIS — Z760 Encounter for issue of repeat prescription: Secondary | ICD-10-CM

## 2019-10-27 DIAGNOSIS — E103593 Type 1 diabetes mellitus with proliferative diabetic retinopathy without macular edema, bilateral: Secondary | ICD-10-CM

## 2019-11-02 NOTE — Telephone Encounter (Signed)
Ok for two refills at bottom of [employer]

## 2019-11-03 MED ORDER — LEVEMIR FLEXTOUCH 100 UNIT/ML ~~LOC~~ SOPN: 65.0000 [IU] | PEN_INJECTOR | Freq: Every day | SUBCUTANEOUS | 3 refills | Status: DC

## 2019-11-03 MED ORDER — INSULIN LISPRO (1 UNIT DIAL) 100 UNIT/ML (KWIKPEN): PEN_INJECTOR | SUBCUTANEOUS | 3 refills | Status: DC

## 2019-11-03 NOTE — Telephone Encounter (Signed)
Prescriptions sent

## 2020-01-24 ENCOUNTER — Telehealth: Payer: Self-pay | Admitting: Neurology

## 2020-01-24 NOTE — Telephone Encounter (Signed)
LVM to call us back to schedule a virtual appt for a follow up

## 2020-01-24 NOTE — Telephone Encounter (Signed)
Patient left vm wanting to schedule a virtual appt to get refills. She had surgery and can not travel yet. Please call patient to schedule virtual follow up for anxiety/DM/ADHD. Thanks!

## 2020-01-25 ENCOUNTER — Telehealth (INDEPENDENT_AMBULATORY_CARE_PROVIDER_SITE_OTHER): Payer: BC Managed Care – PPO | Admitting: Physician Assistant

## 2020-01-25 ENCOUNTER — Encounter: Payer: Self-pay | Admitting: Physician Assistant

## 2020-01-25 VITALS — Ht 64.0 in

## 2020-01-25 DIAGNOSIS — Z91199 Patient's noncompliance with other medical treatment and regimen due to unspecified reason: Secondary | ICD-10-CM

## 2020-01-25 DIAGNOSIS — Z5329 Procedure and treatment not carried out because of patient's decision for other reasons: Secondary | ICD-10-CM

## 2020-01-25 NOTE — Progress Notes (Signed)
LM 1:34.  Waited online for 10 minutes.   No show.

## 2020-01-31 ENCOUNTER — Encounter: Payer: Self-pay | Admitting: Physician Assistant

## 2020-01-31 ENCOUNTER — Telehealth (INDEPENDENT_AMBULATORY_CARE_PROVIDER_SITE_OTHER): Payer: BC Managed Care – PPO | Admitting: Physician Assistant

## 2020-01-31 ENCOUNTER — Telehealth: Payer: Self-pay

## 2020-01-31 DIAGNOSIS — R600 Localized edema: Secondary | ICD-10-CM

## 2020-01-31 DIAGNOSIS — F411 Generalized anxiety disorder: Secondary | ICD-10-CM

## 2020-01-31 DIAGNOSIS — F3340 Major depressive disorder, recurrent, in remission, unspecified: Secondary | ICD-10-CM

## 2020-01-31 DIAGNOSIS — F909 Attention-deficit hyperactivity disorder, unspecified type: Secondary | ICD-10-CM | POA: Diagnosis not present

## 2020-01-31 DIAGNOSIS — F5101 Primary insomnia: Secondary | ICD-10-CM

## 2020-01-31 DIAGNOSIS — F39 Unspecified mood [affective] disorder: Secondary | ICD-10-CM

## 2020-01-31 DIAGNOSIS — E103593 Type 1 diabetes mellitus with proliferative diabetic retinopathy without macular edema, bilateral: Secondary | ICD-10-CM | POA: Diagnosis not present

## 2020-01-31 DIAGNOSIS — E7800 Pure hypercholesterolemia, unspecified: Secondary | ICD-10-CM

## 2020-01-31 DIAGNOSIS — E1043 Type 1 diabetes mellitus with diabetic autonomic (poly)neuropathy: Secondary | ICD-10-CM

## 2020-01-31 DIAGNOSIS — Z760 Encounter for issue of repeat prescription: Secondary | ICD-10-CM | POA: Diagnosis not present

## 2020-01-31 DIAGNOSIS — I10 Essential (primary) hypertension: Secondary | ICD-10-CM

## 2020-01-31 NOTE — Progress Notes (Signed)
Patient ID: Melissa [name], female   DOB: [birth year], 49 y.o.   MRN: [id number] .[name] KitchenVirtual Visit via Telephone Note  I connected with Melissa [name] on 01/31/2020 at 10:30 AM EST by telephone and verified that I am speaking with the correct person using two identifiers.  Location: Patient: home Provider: clinic  .[name] KitchenParticipating in visit:  Patient: [name] Provider: Tandy Gaw PA-C    I discussed the limitations, risks, security and privacy concerns of performing an evaluation and management service by telephone and the availability of in person appointments. I also discussed with the patient that there may be a patient responsible charge related to this service. The patient expressed understanding and agreed to proceed.   History of Present Illness: Pt is a 49 yo female with T1DM, HTN, Adult ADHD, GAD, MDD, Insomnia who calls into the clinic for medication refills.   Mood and focus doing well. No problems or concerns.   She does have a surgery on her teeth tomorrow.   Pt has dexcom and using long acting and short acting insulin. Her last A1C was 6.1 in last few months per patient. No open sores or wounds. Her foot has healed.   She is sleeping well on ambien.   .. 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/2012   Toe osteomyelitis, left (HCC) 05/31/2012   Adult ADHD 08/09/2012   Neuropathy 03/25/2013   Cataract of both eyes 09/13/2013   Mood disorder (HCC) 09/13/2013   GAD (generalized anxiety disorder) 09/13/2013   Type I  diabetes mellitus with peripheral autonomic neuropathy (HCC) 09/13/2013   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) 04/26/2014   Vitreous hemorrhage of left eye (HCC) 04/26/2014   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) 04/26/2014   Right foot pain 02/12/2015   Poor dentition 02/12/2015   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   Left foot pain 02/13/2015   DDD (degenerative disc disease), lumbar 02/13/2015   Swelling of lower extremity 07/13/2015   Pure hypercholesterolemia 05/22/2016   BMI 31.0-31.9,adult 11/20/2016   Lower extremity edema 05/20/2017   Sinus pressure 08/24/2017   Primary insomnia 03/13/2019   Resolved Ambulatory Problems    Diagnosis Date Noted   URI 07/03/2009   TRIGGER FINGER, LEFT THUMB 05/14/2009   FOOT SPRAIN, RIGHT 02/24/2010   FOOT INJURY, RIGHT 02/24/2010   ADHD 03/19/2010   Acute bronchitis 10/06/2010   Cough 10/06/2010   Wound, open, leg 12/25/2011   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     Observations/Objective: No acute distress Normal mood.  Normal breathing.   .. Today's Vitals   01/31/20 1023  BP: 110/60  Pulse: 72  Resp: 18  Temp: 98.7 F (37.1 C)  TempSrc: Oral  Weight: 148 lb (67.1 kg)  Height: 5\' 4"  (  1.626 m)   Body mass index is 25.4 kg/m.  .. Depression screen [facility] 2/9 01/31/2020 06/28/2019 03/08/2019 10/13/2018 06/14/2018  Decreased Interest 1 0 0 0 2  Down, Depressed, Hopeless 1 1 2 1  0  PHQ - 2 Score 2 1 2 1 2   Altered sleeping 3 3 3 3 3   Tired, decreased energy 1 1 1 1 2   Change in appetite 1 0 0 0 0  Feeling bad or failure about yourself  1 1 0 1 0  Trouble concentrating 3 2 2 3  0   Moving slowly or fidgety/restless 3 0 0 1 0  Suicidal thoughts 0 0 0 0 0  PHQ-9 Score 14 8 8 10 7   Difficult doing work/chores - Somewhat difficult Somewhat difficult Very difficult Somewhat difficult  Some recent data might be hidden   . GAD 7 : Generalized Anxiety Score 01/31/2020 06/28/2019 03/08/2019 10/13/2018  Nervous, Anxious, on Edge 3 1 1 3   Control/stop worrying 3 1 1 3   Worry too much - different things 3 0 2 3  Trouble relaxing 3 3 2 1   Restless 3 0 0 1  Easily annoyed or irritable 3 1 2 3   Afraid - awful might happen 1 1 0 1  Total GAD 7 Score 19 7 8 15   Anxiety Difficulty Somewhat difficult Somewhat difficult Not difficult at all Very difficult      Assessment and Plan: [name] KitchenDeanna was seen today for follow-up.  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  Medication refill -     ALPRAZolam (XANAX) 1 MG tablet; TAKE 1 TABLET BY MOUTH 3 TIMES A DAY -     lisinopril (ZESTRIL) 5 MG tablet; Take 1 tablet (5 mg total) by mouth daily. -     buPROPion (WELLBUTRIN XL) 150 MG 24 hr tablet; Take 1 tablet (150 mg total) by mouth daily. -     buPROPion (WELLBUTRIN XL) 300 MG 24 hr tablet; Take 1 tablet (300 mg total) by mouth daily. -     furosemide (LASIX) 20 MG tablet; TAKE 1 TABLET (20 MG TOTAL) BY MOUTH DAILY AS NEEDED FOR EDEMA. -     traMADol (ULTRAM) 50 MG tablet; 1-2 tabs by mouth Q8 hours, maximum 6 tabs per day. -     lamoTRIgine (LAMICTAL) 150 MG tablet; Take 1 tablet (150 mg total) by mouth daily. -     zolpidem (AMBIEN CR) 12.5 MG CR tablet; TAKE 1 TABLET BY MOUTH AT BEDTIME -     amitriptyline (ELAVIL) 10 MG tablet; 4 tablets at bedtime PRN insomnia -     atorvastatin (LIPITOR) 80 MG tablet; Take 1 tablet (80 mg  total) by mouth daily. -     insulin detemir (LEVEMIR FLEXTOUCH) 100 UNIT/ML FlexPen; Inject 65-70 Units into the skin daily.  Adult ADHD  GAD (generalized anxiety disorder) -     ALPRAZolam (XANAX) 1 MG tablet; TAKE 1 TABLET BY MOUTH 3 TIMES A DAY -     buPROPion (WELLBUTRIN XL) 150 MG 24 hr tablet; Take 1 tablet (150 mg total) by mouth daily. -     buPROPion (WELLBUTRIN XL) 300 MG 24 hr tablet; Take 1 tablet (300 mg total) by mouth daily.  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 -     insulin detemir (LEVEMIR FLEXTOUCH) 100 UNIT/ML FlexPen; Inject 65-70 Units into the skin daily.  Essential hypertension, benign -     lisinopril (ZESTRIL) 5 MG tablet; Take 1 tablet (5 mg total) by mouth daily.  Recurrent major depressive disorder, in remission (HCC) -     buPROPion (WELLBUTRIN XL) 150 MG 24 hr tablet; Take 1 tablet (150 mg total) by mouth daily. -     buPROPion (WELLBUTRIN XL) 300 MG 24 hr tablet; Take 1 tablet (300 mg total) by mouth daily.  Lower extremity edema -     furosemide (LASIX) 20 MG tablet; TAKE 1 TABLET (20 MG TOTAL) BY MOUTH DAILY AS NEEDED FOR EDEMA.  Mood disorder (HCC) -     lamoTRIgine (LAMICTAL) 150 MG tablet; Take 1 tablet (150 mg total) by mouth daily.  Primary insomnia -     zolpidem (AMBIEN CR) 12.5 MG CR tablet; TAKE 1 TABLET BY MOUTH AT BEDTIME -     amitriptyline (ELAVIL) 10 MG tablet; 4 tablets at bedtime PRN insomnia  Pure hypercholesterolemia -     atorvastatin (LIPITOR) 80 MG tablet; Take 1 tablet (80 mg total) by mouth daily.   Per patient she has labs. She needs to get them here.  Refilled medication.   Pt has not been seen in clinic in quite some time. Next visit needs to be in person.    Follow Up Instructions:    I discussed the assessment and treatment plan with the patient. The patient was provided an opportunity to ask  questions and all were answered. The patient agreed with the plan and demonstrated an understanding of the instructions.   The patient was advised to call back or seek an in-person evaluation if the symptoms worsen or if the condition fails to improve as anticipated.  I provided 20 minutes of non-face-to-face time during this encounter.   Tandy Gaw, PA-C

## 2020-01-31 NOTE — Telephone Encounter (Signed)
Melissa [name] called and left a message stating the inhaled insulin is causing a rash. She wants to switch back to regular insulin.

## 2020-01-31 NOTE — Progress Notes (Signed)
Needs refills on all her medications PHQ9 (14) -GAD7 (19) completed.   Having mouth surgery tomorrow, was placed on medication for bone growth and affected teeth (Reclast)

## 2020-02-01 ENCOUNTER — Telehealth: Payer: BC Managed Care – PPO | Admitting: Physician Assistant

## 2020-02-01 NOTE — Telephone Encounter (Signed)
Humalog was sent 11/03/2019 52mL with 3 refills.

## 2020-02-02 NOTE — Telephone Encounter (Signed)
Patient advised.

## 2020-02-06 ENCOUNTER — Other Ambulatory Visit: Payer: Self-pay

## 2020-02-06 MED ORDER — BUPROPION HCL ER (XL) 300 MG PO TB24: 300.0000 mg | ORAL_TABLET | Freq: Every day | ORAL | 1 refills | Status: DC

## 2020-02-06 MED ORDER — FUROSEMIDE 20 MG PO TABS: ORAL_TABLET | ORAL | 1 refills | Status: DC

## 2020-02-06 MED ORDER — LISINOPRIL 5 MG PO TABS: 5.0000 mg | ORAL_TABLET | Freq: Every day | ORAL | 1 refills | Status: DC

## 2020-02-06 MED ORDER — TRAMADOL HCL 50 MG PO TABS: ORAL_TABLET | ORAL | 1 refills | Status: DC

## 2020-02-06 MED ORDER — AMITRIPTYLINE HCL 10 MG PO TABS: ORAL_TABLET | ORAL | 1 refills | Status: DC

## 2020-02-06 MED ORDER — ALPRAZOLAM 1 MG PO TABS: ORAL_TABLET | ORAL | 1 refills | Status: DC

## 2020-02-06 MED ORDER — INSULIN LISPRO (1 UNIT DIAL) 100 UNIT/ML (KWIKPEN): PEN_INJECTOR | SUBCUTANEOUS | 3 refills | Status: DC

## 2020-02-06 MED ORDER — ZOLPIDEM TARTRATE ER 12.5 MG PO TBCR: EXTENDED_RELEASE_TABLET | ORAL | 1 refills | Status: DC

## 2020-02-06 MED ORDER — ATORVASTATIN CALCIUM 80 MG PO TABS: 80.0000 mg | ORAL_TABLET | Freq: Every day | ORAL | 1 refills | Status: DC

## 2020-02-06 MED ORDER — LEVEMIR FLEXTOUCH 100 UNIT/ML ~~LOC~~ SOPN: 65.0000 [IU] | PEN_INJECTOR | Freq: Every day | SUBCUTANEOUS | 3 refills | Status: DC

## 2020-02-06 MED ORDER — LAMOTRIGINE 150 MG PO TABS: 150.0000 mg | ORAL_TABLET | Freq: Every day | ORAL | 4 refills | Status: DC

## 2020-02-06 MED ORDER — BUPROPION HCL ER (XL) 150 MG PO TB24: 150.0000 mg | ORAL_TABLET | Freq: Every day | ORAL | 1 refills | Status: DC

## 2020-02-08 ENCOUNTER — Other Ambulatory Visit: Payer: Self-pay | Admitting: Physician Assistant

## 2020-02-08 DIAGNOSIS — Z760 Encounter for issue of repeat prescription: Secondary | ICD-10-CM

## 2020-02-08 DIAGNOSIS — F909 Attention-deficit hyperactivity disorder, unspecified type: Secondary | ICD-10-CM

## 2020-02-08 MED ORDER — LISDEXAMFETAMINE DIMESYLATE 70 MG PO CAPS: 70.0000 mg | ORAL_CAPSULE | ORAL | 0 refills | Status: DC

## 2020-02-08 NOTE — Telephone Encounter (Signed)
Please schedule 3 month follow up for in office visit.

## 2020-02-08 NOTE — Telephone Encounter (Signed)
Call pt: next visit is going to need to be in person. It has been a long time since seen.

## 2020-02-08 NOTE — Telephone Encounter (Signed)
Pt seen 12/7 metheney not in office today Need 90 day supply [facility] and pending below She is aware next visit needs to be in person. Please sign.

## 2020-02-08 NOTE — Telephone Encounter (Signed)
LVM for patient to call back to get this appt scheduled. AM 

## 2020-02-13 ENCOUNTER — Telehealth: Payer: Self-pay | Admitting: Neurology

## 2020-02-13 DIAGNOSIS — E103593 Type 1 diabetes mellitus with proliferative diabetic retinopathy without macular edema, bilateral: Secondary | ICD-10-CM

## 2020-02-13 DIAGNOSIS — Z760 Encounter for issue of repeat prescription: Secondary | ICD-10-CM

## 2020-02-13 DIAGNOSIS — E1043 Type 1 diabetes mellitus with diabetic autonomic (poly)neuropathy: Secondary | ICD-10-CM

## 2020-02-13 MED ORDER — INSULIN LISPRO (1 UNIT DIAL) 100 UNIT/ML (KWIKPEN): PEN_INJECTOR | SUBCUTANEOUS | 0 refills | Status: DC

## 2020-02-13 MED ORDER — LEVEMIR FLEXTOUCH 100 UNIT/ML ~~LOC~~ SOPN: 65.0000 [IU] | PEN_INJECTOR | Freq: Every day | SUBCUTANEOUS | 0 refills | Status: DC

## 2020-02-13 NOTE — Telephone Encounter (Signed)
Patient left vm asking for 90 day supply of insulin. RX resent for 90 day supply.

## 2020-02-14 MED ORDER — GLUCOSE METER TEST VI STRP: ORAL_STRIP | 0 refills | Status: DC

## 2020-02-14 NOTE — Telephone Encounter (Signed)
Patient's husband states Dexcom isn't working and won't be able to get another one for 2 days. Asking for RX for test strips. RX written. Could not tell what brand of machine they have. Wrote for generic strips.

## 2020-02-14 NOTE — Addendum Note (Signed)
Addended bySilvio Pate on: 02/14/2020 09:56 AM   Modules accepted: Orders

## 2020-05-01 ENCOUNTER — Other Ambulatory Visit: Payer: Self-pay | Admitting: Physician Assistant

## 2020-05-01 DIAGNOSIS — E1043 Type 1 diabetes mellitus with diabetic autonomic (poly)neuropathy: Secondary | ICD-10-CM

## 2020-05-01 DIAGNOSIS — Z760 Encounter for issue of repeat prescription: Secondary | ICD-10-CM

## 2020-05-04 ENCOUNTER — Telehealth (INDEPENDENT_AMBULATORY_CARE_PROVIDER_SITE_OTHER): Payer: BC Managed Care – PPO | Admitting: Physician Assistant

## 2020-05-04 ENCOUNTER — Encounter: Payer: Self-pay | Admitting: Physician Assistant

## 2020-05-04 VITALS — BP 122/76 | HR 97 | Temp 97.8°F | Ht 64.0 in | Wt 168.0 lb

## 2020-05-04 DIAGNOSIS — F411 Generalized anxiety disorder: Secondary | ICD-10-CM

## 2020-05-04 DIAGNOSIS — Z760 Encounter for issue of repeat prescription: Secondary | ICD-10-CM

## 2020-05-04 DIAGNOSIS — E1043 Type 1 diabetes mellitus with diabetic autonomic (poly)neuropathy: Secondary | ICD-10-CM

## 2020-05-04 DIAGNOSIS — F909 Attention-deficit hyperactivity disorder, unspecified type: Secondary | ICD-10-CM | POA: Diagnosis not present

## 2020-05-04 DIAGNOSIS — B377 Candidal sepsis: Secondary | ICD-10-CM

## 2020-05-04 DIAGNOSIS — F5101 Primary insomnia: Secondary | ICD-10-CM

## 2020-05-04 MED ORDER — ZOLPIDEM TARTRATE ER 12.5 MG PO TBCR: EXTENDED_RELEASE_TABLET | ORAL | 1 refills | Status: DC

## 2020-05-04 MED ORDER — DEXCOM G6 SENSOR MISC: 1 refills | Status: DC

## 2020-05-04 MED ORDER — FLUCONAZOLE 150 MG PO TABS: 150.0000 mg | ORAL_TABLET | Freq: Every day | ORAL | 1 refills | Status: DC

## 2020-05-04 MED ORDER — DEXCOM G6 TRANSMITTER MISC: 1.0000 [IU] | 3 refills | Status: DC

## 2020-05-04 MED ORDER — ALPRAZOLAM 1 MG PO TABS: ORAL_TABLET | ORAL | 1 refills | Status: DC

## 2020-05-04 NOTE — Progress Notes (Signed)
..Virtual Visit via Telephone Note  I connected with Melissa [name] on 05/04/2020 at  2:20 PM EST by telephone and verified that I am speaking with the correct person using two identifiers.  Location: Patient: home Provider: clinic  .[name] KitchenParticipating in visit:  Patient: Melissa [name] Provider: Tandy Gaw PA-C   I discussed the limitations, risks, security and privacy concerns of performing an evaluation and management service by telephone and the availability of in person appointments. I also discussed with the patient that there may be a patient responsible charge related to this service. The patient expressed understanding and agreed to proceed.   History of Present Illness: Patient is a 50 year old female with hypertension, type 1 diabetes, retinal ischemia, diabetic neuropathy, chronic low back pain, adult ADHD, anxiety, depression who presents via telephone to the clinic for medication refills.  Patient is currently living most of her time in [place] and seeing specialist in [place].  She reports her last A1c to be 6.5.  She is using the Dexcom glucometer and keeping her sugars under tight control.  She is continuing to use Levemir as her long-acting insulin and Humalog as her short acting.  She uses a sliding scale. She is checking her sugars and meeting goals over under 130 in am and under 180 before bed. Rare hypoglycemic events.   Patient is under regular specialist care in [place] with an ophthalmologist and podiatrist.  She has had multiple surgeries on her foot due to diabetic ulcers and multiple procedures to her eye via complications of type 1 diabetes.  She continues to use Vyvanse for her ADHD.  She is doing well.  She has had no concerns or complaints.  She denies any increase in anxiety or insomnia.  She has been controlled on Ambien for insomnia for many years.  She does need refills.  For her chronic pain is on tramadol. On dose for a while. No concerns.     .. Active  Ambulatory Problems    Diagnosis Date Noted  . Obesity 11/23/2009  . Anxiety 11/12/2006  . INSOMNIA, CHRONIC 12/16/2006  . MDD (major depressive disorder), recurrent episode, moderate (HCC) 11/12/2006  . Essential hypertension, benign 11/23/2009  . Non-seasonal allergic rhinitis due to pollen 07/03/2009  . WEIGHT GAIN 11/12/2006  . LUMBAGO 03/19/2010  . Eczema 03/26/2011  . Carpal tunnel syndrome 03/26/2011  . Anemia 02/05/2012  . Post-traumatic wound infection 05/13/2012  . Toe osteomyelitis, left (HCC) 05/31/2012  . Adult ADHD 08/09/2012  . Neuropathy 03/25/2013  . Cataract of both eyes 09/13/2013  . Mood disorder (HCC) 09/13/2013  . GAD (generalized anxiety disorder) 09/13/2013  . 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 Vitreous hemorrhage of left eye (HCC) 04/26/2014  . 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 Right foot pain 02/12/2015  . Poor dentition 02/12/2015  . 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  . Left foot pain 02/13/2015  . DDD (degenerative disc disease), lumbar 02/13/2015  . Swelling of lower extremity 07/13/2015  . Pure hypercholesterolemia 05/22/2016  . BMI 31.0-31.9,adult 11/20/2016  . Lower extremity edema 05/20/2017  . Sinus pressure 08/24/2017  . Primary insomnia 03/13/2019   Resolved Ambulatory Problems    Diagnosis Date Noted  . URI 07/03/2009  . TRIGGER FINGER, LEFT THUMB 05/14/2009  . FOOT SPRAIN, RIGHT 02/24/2010  . FOOT INJURY, RIGHT 02/24/2010  . ADHD 03/19/2010  . Acute  bronchitis 10/06/2010  . Cough 10/06/2010  . Wound, open, leg 12/25/2011  . Gas gangrene of extremity (HCC) 02/05/2012  . Cellulitis of leg, left 05/13/2012   Past Medical History:  Diagnosis Date  . Depression   . Diabetes mellitus 3-10  . DM type 2 causing CKD stage 3 (HCC)   . PTSD  (post-traumatic stress disorder)    Reviewed med, allergy, problem list.   Observations/Objective: No acute distress Normal breathing No cough Normal mood.   .. Today's Vitals   05/04/20 1413  BP: 122/76  Pulse: 97  Temp: 97.8 F (36.6 C)  TempSrc: Oral  Weight: 168 lb (76.2 kg)  Height: 5\' 4"  (1.626 m)   Body mass index is 28.84 kg/m.  .. Depression screen [facility] 2/9 05/04/2020 01/31/2020 06/28/2019 03/08/2019 10/13/2018  Decreased Interest 1 1 0 0 0  Down, Depressed, Hopeless 0 1 1 2 1   PHQ - 2 Score 1 2 1 2 1   Altered sleeping 3 3 3 3 3   Tired, decreased energy 1 1 1 1 1   Change in appetite 0 1 0 0 0  Feeling bad or failure about yourself  1 1 1  0 1  Trouble concentrating 1 3 2 2 3   Moving slowly or fidgety/restless 0 3 0 0 1  Suicidal thoughts 0 0 0 0 0  PHQ-9 Score 7 14 8 8 10   Difficult doing work/chores Somewhat difficult - Somewhat difficult Somewhat difficult Very difficult  Some recent data might be hidden   .10/15/2018 GAD 7 : Generalized Anxiety Score 05/04/2020 01/31/2020 06/28/2019 03/08/2019  Nervous, Anxious, on Edge 3 3 1 1   Control/stop worrying 3 3 1 1   Worry too much - different things 3 3 0 2  Trouble relaxing 1 3 3 2   Restless 0 3 0 0  Easily annoyed or irritable 1 3 1 2   Afraid - awful might happen 1 1 1  0  Total GAD 7 Score 12 19 7 8   Anxiety Difficulty Somewhat difficult Somewhat difficult Somewhat difficult Not difficult at all       Assessment and Plan:  Matricia was seen today for follow-up.  Diagnoses and all orders for this visit:  Adult ADHD  Medication refill -     ALPRAZolam (XANAX) 1 MG tablet; TAKE 1 TABLET BY MOUTH 3 TIMES A DAY -     zolpidem (AMBIEN CR) 12.5 MG CR tablet; TAKE 1 TABLET BY MOUTH AT BEDTIME -     Continuous Blood Gluc Transmit (DEXCOM G6 TRANSMITTER) MISC; 1 Units by Does not apply route every 3 (three) months. -     Continuous Blood Gluc Sensor (DEXCOM G6 SENSOR) MISC; Change sensor every 10 days.  Candidiasis,  systemic (HCC) -     fluconazole (DIFLUCAN) 150 MG tablet; Take 1 tablet (150 mg total) by mouth daily.  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 -     Continuous Blood Gluc Sensor (DEXCOM G6 SENSOR) MISC; Change sensor every 10 days.   Patient is going to need in person visit in the next 3 months.  I am  also going to need copies of all her labs that she has had done.  I do not have access in epic to this information.  We need to update chart.  She is reporting her sugars to be controlled which is excellent.  I do need to get actual proof of this.  Patient is aware I would also like her last ophthalmology and last podiatry note. Patient is on statin Patient is on ACE inhibitor Per patient she has her COVID and pneumonia vaccine and flu vaccine.  She has been controlled on same dose of Vyvanse for many years.  Needs refill.  We will send to supervising physician to send 90-day supply.  Ambien refilled for sleep.  Anxiety and depression seem to be controlled.  Continue on same dose.  Any refills needed or sent.   Follow Up Instructions:    I discussed the assessment and treatment plan with the patient. The patient was provided an opportunity to ask questions and all were answered. The patient agreed with the plan and demonstrated an understanding of the instructions.   The patient was advised to call back or seek an in-person evaluation if the symptoms worsen or if the condition fails to improve as anticipated.  I provided 30 minutes of non-face-to-face time during this encounter.   Tandy Gaw, PA-C

## 2020-05-08 ENCOUNTER — Other Ambulatory Visit: Payer: Self-pay | Admitting: Physician Assistant

## 2020-05-08 DIAGNOSIS — F909 Attention-deficit hyperactivity disorder, unspecified type: Secondary | ICD-10-CM

## 2020-05-08 DIAGNOSIS — Z760 Encounter for issue of repeat prescription: Secondary | ICD-10-CM

## 2020-05-08 MED ORDER — TRAMADOL HCL 50 MG PO TABS: ORAL_TABLET | ORAL | 0 refills | Status: DC

## 2020-05-08 MED ORDER — LISDEXAMFETAMINE DIMESYLATE 70 MG PO CAPS: 70.0000 mg | ORAL_CAPSULE | ORAL | 0 refills | Status: DC

## 2020-05-08 NOTE — Telephone Encounter (Signed)
Patient made aware next follow up has to be in person. She expressed understanding.

## 2020-05-08 NOTE — Telephone Encounter (Signed)
Seen 05/04/2020. Needs [facility] day [facility] and pended below.  Controlled on same dose for years.

## 2020-05-11 ENCOUNTER — Telehealth: Payer: Self-pay

## 2020-05-11 NOTE — Telephone Encounter (Signed)
PA sent for Vyvanse

## 2020-05-15 NOTE — Telephone Encounter (Signed)
PA for Vyvanse approved 04/17/2020 - 05/15/2021 case # [id number]  Left message advising patient.

## 2020-06-13 ENCOUNTER — Other Ambulatory Visit: Payer: Self-pay | Admitting: Physician Assistant

## 2020-06-13 DIAGNOSIS — E103593 Type 1 diabetes mellitus with proliferative diabetic retinopathy without macular edema, bilateral: Secondary | ICD-10-CM

## 2020-08-24 ENCOUNTER — Encounter: Payer: Self-pay | Admitting: Physician Assistant

## 2020-08-24 ENCOUNTER — Telehealth (INDEPENDENT_AMBULATORY_CARE_PROVIDER_SITE_OTHER): Payer: BC Managed Care – PPO | Admitting: Physician Assistant

## 2020-08-24 DIAGNOSIS — Z79899 Other long term (current) drug therapy: Secondary | ICD-10-CM | POA: Diagnosis not present

## 2020-08-24 DIAGNOSIS — E1043 Type 1 diabetes mellitus with diabetic autonomic (poly)neuropathy: Secondary | ICD-10-CM

## 2020-08-24 DIAGNOSIS — F909 Attention-deficit hyperactivity disorder, unspecified type: Secondary | ICD-10-CM

## 2020-08-24 DIAGNOSIS — F331 Major depressive disorder, recurrent, moderate: Secondary | ICD-10-CM

## 2020-08-24 DIAGNOSIS — F5101 Primary insomnia: Secondary | ICD-10-CM

## 2020-08-24 DIAGNOSIS — I10 Essential (primary) hypertension: Secondary | ICD-10-CM | POA: Diagnosis not present

## 2020-08-24 DIAGNOSIS — Z760 Encounter for issue of repeat prescription: Secondary | ICD-10-CM

## 2020-08-24 DIAGNOSIS — F411 Generalized anxiety disorder: Secondary | ICD-10-CM

## 2020-08-24 DIAGNOSIS — F39 Unspecified mood [affective] disorder: Secondary | ICD-10-CM

## 2020-08-24 DIAGNOSIS — E103593 Type 1 diabetes mellitus with proliferative diabetic retinopathy without macular edema, bilateral: Secondary | ICD-10-CM

## 2020-08-24 MED ORDER — DEXCOM G6 TRANSMITTER MISC: 1.0000 [IU] | 3 refills | Status: DC

## 2020-08-24 MED ORDER — DEXCOM G6 SENSOR MISC: 1 refills | Status: DC

## 2020-08-24 NOTE — Progress Notes (Signed)
Pt reports that she is unable to get her prostheses of the L sis due t\to [place] not healing well

## 2020-08-24 NOTE — Progress Notes (Signed)
Patient ID: Melissa [name], female   DOB: 11/21/1970, 50 y.o.   MRN: [id number] .[name] KitchenVirtual Visit via Telephone Note  I connected with Melissa [name] [name] on 09/18/20 at 11:30 AM EDT by telephone and verified that I am speaking with the correct person using two identifiers.  Location: Patient: out of town/second home in [place] Provider: clinic  .[name] KitchenParticipating in visit:  Patient: Melissa [name] Provider: Tandy Gaw PA-C    I discussed the limitations, risks, security and privacy concerns of performing an evaluation and management service by telephone and the availability of in person appointments. I also discussed with the patient that there may be a patient responsible charge related to this service. The patient expressed understanding and agreed to proceed.   History of Present Illness: Pt is a 50 yo female with extensive problem list.   .. 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/2012   Toe osteomyelitis, left (HCC) 05/31/2012   Adult ADHD 08/09/2012   Neuropathy 03/25/2013   Cataract of both eyes 09/13/2013   Mood disorder (HCC) 09/13/2013   GAD (generalized anxiety disorder) 09/13/2013   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 (HCC) 09/13/2013   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) 04/26/2014   Vitreous hemorrhage of left eye (HCC) 04/26/2014   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) 04/26/2014   Right foot pain 02/12/2015   Poor dentition 02/12/2015   Recurrent cellulitis of  lower leg 02/12/2015   Left foot pain 02/13/2015   DDD (degenerative disc disease), lumbar 02/13/2015   Swelling of lower extremity 07/13/2015   Pure hypercholesterolemia 05/22/2016   BMI 31.0-31.9,adult 11/20/2016   Lower extremity edema 05/20/2017   Sinus pressure 08/24/2017   Primary insomnia 03/13/2019   Medication management 09/17/2020   Resolved Ambulatory Problems    Diagnosis Date Noted   URI 07/03/2009   TRIGGER FINGER, LEFT THUMB 05/14/2009   FOOT SPRAIN, RIGHT 02/24/2010   FOOT INJURY, RIGHT 02/24/2010   ADHD 03/19/2010   Acute bronchitis 10/06/2010   Cough 10/06/2010   Wound, open, leg 12/25/2011   Gas gangrene of extremity (HCC) 02/05/2012   Cellulitis of leg, left 05/13/2012   Past Medical History:  Diagnosis Date   Depression    Diabetes mellitus 3-10   DM type 2 causing CKD stage 3 (HCC)    PTSD (post-traumatic stress disorder)    She is wanting refills. She reports surgery 04/16/20 on her left foot and amputation. She had to have revision 2 weeks later. Waiting on prothesis. She reports she has had A1C and controlled but does not have report.    Observations/Objective: No acute distress Normal mood Normal breathing  .[name] KitchenThere were no vitals filed for this visit. There is no height or weight on file to calculate BMI.  .. Depression screen [facility] 2/9 05/04/2020 01/31/2020 06/28/2019 03/08/2019 10/13/2018  Decreased Interest 1 1 0 0 0  Down, Depressed, Hopeless 0 1 1 2 1   PHQ - 2 Score 1 2 1 2 1   Altered sleeping 3 3 3 3 3   Tired, decreased energy 1 1  1 1 1   Change in appetite 0 1 0 0 0  Feeling bad or failure about yourself  1 1 1  0 1  Trouble concentrating 1 3 2 2 3   Moving slowly or fidgety/restless 0 3 0 0 1  Suicidal thoughts 0 0 0 0 0  PHQ-9 Score 7 14 8 8 10   Difficult doing work/chores Somewhat difficult - Somewhat difficult Somewhat difficult Very difficult  Some recent data might be hidden      Assessment and Plan:  Emmalin was seen today for  adhd.  Diagnoses and all orders for this visit:  Medication management  Essential hypertension, benign  Type 1 diabetes mellitus with proliferative retinopathy of both eyes without macular edema (HCC)  Adult ADHD  MDD (major depressive disorder), recurrent episode, moderate (HCC)  Mood disorder (HCC)   Pt has many ongoing chronic needs. I have not seen her in clinic in over a year. She will make appt, 7/27, for in person. I do not have UTD labs and she is a type I diabetic. She has been seeing specialist and has had numerous surgeries since seen last.   Follow Up Instructions:    I discussed the assessment and treatment plan with the patient. The patient was provided an opportunity to ask questions and all were answered. The patient agreed with the plan and demonstrated an understanding of the instructions.   The patient was advised to call back or seek an in-person evaluation if the symptoms worsen or if the condition fails to improve as anticipated.  I provided 5 minutes of non-face-to-face time during this encounter.   , PA-C

## 2020-08-24 NOTE — Telephone Encounter (Signed)
sent 

## 2020-09-07 ENCOUNTER — Other Ambulatory Visit: Payer: Self-pay | Admitting: Family Medicine

## 2020-09-07 ENCOUNTER — Other Ambulatory Visit: Payer: Self-pay | Admitting: Physician Assistant

## 2020-09-07 DIAGNOSIS — E103593 Type 1 diabetes mellitus with proliferative diabetic retinopathy without macular edema, bilateral: Secondary | ICD-10-CM

## 2020-09-07 DIAGNOSIS — F909 Attention-deficit hyperactivity disorder, unspecified type: Secondary | ICD-10-CM

## 2020-09-07 DIAGNOSIS — Z760 Encounter for issue of repeat prescription: Secondary | ICD-10-CM

## 2020-09-07 MED ORDER — LEVEMIR FLEXTOUCH 100 UNIT/ML ~~LOC~~ SOPN: PEN_INJECTOR | SUBCUTANEOUS | 0 refills | Status: DC

## 2020-09-07 MED ORDER — INSULIN LISPRO (1 UNIT DIAL) 100 UNIT/ML (KWIKPEN): PEN_INJECTOR | SUBCUTANEOUS | 0 refills | Status: DC

## 2020-09-07 MED ORDER — ZOLPIDEM TARTRATE ER 12.5 MG PO TBCR: EXTENDED_RELEASE_TABLET | ORAL | 0 refills | Status: DC

## 2020-09-07 MED ORDER — ALPRAZOLAM 1 MG PO TABS: ORAL_TABLET | ORAL | 0 refills | Status: DC

## 2020-09-07 NOTE — Addendum Note (Signed)
Addended bySilvio Pate on: 09/07/2020 11:52 AM   Modules accepted: Orders

## 2020-09-07 NOTE — Telephone Encounter (Signed)
See note from patient.  Prescriptions pended.

## 2020-09-09 ENCOUNTER — Other Ambulatory Visit: Payer: Self-pay | Admitting: Nurse Practitioner

## 2020-09-09 ENCOUNTER — Other Ambulatory Visit: Payer: Self-pay | Admitting: Physician Assistant

## 2020-09-09 DIAGNOSIS — E1043 Type 1 diabetes mellitus with diabetic autonomic (poly)neuropathy: Secondary | ICD-10-CM

## 2020-09-09 DIAGNOSIS — Z760 Encounter for issue of repeat prescription: Secondary | ICD-10-CM

## 2020-09-09 DIAGNOSIS — B377 Candidal sepsis: Secondary | ICD-10-CM

## 2020-09-11 MED ORDER — LISDEXAMFETAMINE DIMESYLATE 70 MG PO CAPS: 70.0000 mg | ORAL_CAPSULE | ORAL | 0 refills | Status: DC

## 2020-09-17 DIAGNOSIS — Z79899 Other long term (current) drug therapy: Secondary | ICD-10-CM | POA: Insufficient documentation

## 2020-09-18 ENCOUNTER — Telehealth: Payer: BC Managed Care – PPO | Admitting: Physician Assistant

## 2020-09-19 ENCOUNTER — Telehealth: Payer: BC Managed Care – PPO | Admitting: Physician Assistant

## 2020-09-19 ENCOUNTER — Telehealth: Payer: Self-pay | Admitting: Neurology

## 2020-09-19 ENCOUNTER — Encounter: Payer: Self-pay | Admitting: Physician Assistant

## 2020-09-19 DIAGNOSIS — E1043 Type 1 diabetes mellitus with diabetic autonomic (poly)neuropathy: Secondary | ICD-10-CM

## 2020-09-19 DIAGNOSIS — E103593 Type 1 diabetes mellitus with proliferative diabetic retinopathy without macular edema, bilateral: Secondary | ICD-10-CM

## 2020-09-19 DIAGNOSIS — Z760 Encounter for issue of repeat prescription: Secondary | ICD-10-CM

## 2020-09-19 MED ORDER — INSULIN LISPRO (1 UNIT DIAL) 100 UNIT/ML (KWIKPEN): PEN_INJECTOR | SUBCUTANEOUS | 0 refills | Status: DC

## 2020-09-19 MED ORDER — LEVEMIR FLEXTOUCH 100 UNIT/ML ~~LOC~~ SOPN: PEN_INJECTOR | SUBCUTANEOUS | 0 refills | Status: DC

## 2020-09-19 NOTE — Telephone Encounter (Signed)
Okay per Lesly Rubenstein to send insulin til patient can fly back here. She will be here in September. 90 day supply sent to [facility] per patient request. Taking as follows:  Humalog 10 units TID, 4 units if blood sugar over 200 or if having snacks Levemir 15 units TID  A1C has been 6.8 at home per patient. She is having bimonthly labs with her surgeon.

## 2020-10-03 ENCOUNTER — Other Ambulatory Visit: Payer: Self-pay | Admitting: Physician Assistant

## 2020-10-03 DIAGNOSIS — B377 Candidal sepsis: Secondary | ICD-10-CM

## 2020-10-12 ENCOUNTER — Other Ambulatory Visit: Payer: Self-pay | Admitting: Physician Assistant

## 2020-10-12 DIAGNOSIS — E1043 Type 1 diabetes mellitus with diabetic autonomic (poly)neuropathy: Secondary | ICD-10-CM

## 2020-10-12 DIAGNOSIS — Z760 Encounter for issue of repeat prescription: Secondary | ICD-10-CM

## 2020-10-15 ENCOUNTER — Other Ambulatory Visit: Payer: Self-pay

## 2020-10-15 DIAGNOSIS — Z760 Encounter for issue of repeat prescription: Secondary | ICD-10-CM

## 2020-10-15 DIAGNOSIS — E1043 Type 1 diabetes mellitus with diabetic autonomic (poly)neuropathy: Secondary | ICD-10-CM

## 2020-10-15 MED ORDER — LEVEMIR FLEXTOUCH 100 UNIT/ML ~~LOC~~ SOPN: PEN_INJECTOR | SUBCUTANEOUS | 2 refills | Status: DC

## 2020-10-16 ENCOUNTER — Telehealth: Payer: Self-pay | Admitting: Neurology

## 2020-10-16 NOTE — Telephone Encounter (Signed)
She should have enough then?

## 2020-10-16 NOTE — Telephone Encounter (Signed)
Patient's husband left a vm with an update on patient and wanted me to make [facility], [facility], [facility] aware.   Patient started having seizures on Friday evening. Her blood sugar level was 55. She forgot to take her insulin on Saturday and ended up with a blood sugar level over 400. She continued small seizures on Saturday and Sunday. Sunday evening (around 3 am) she had a bad seizure and they took her to the hospital. She was admitted and they put her on Lamotrigine. She has been on this medication for two days and has had no further seizures. She is still currently admitted.   Melissa [name] - FYI.

## 2020-10-16 NOTE — Telephone Encounter (Signed)
Patient's husband also left a vm on my phone asking the same thing. It looks like prescription was sent for Lamotrigine 150 mg back in December last year for over a year of refills.   I called patient's husband and made him aware, he thinks she has not been taking this. The hospital did approve a 30 day supply. They will contact [facility] to get further refills as they should be available.

## 2020-10-16 NOTE — Telephone Encounter (Signed)
Melissa [name]'s husband called and left a message stating she needs a new prescription for the Lamotrigine that was given in the hospital for seizures. She has a follow up with Neurology in September. Please advise next steps.

## 2020-11-03 ENCOUNTER — Other Ambulatory Visit: Payer: Self-pay | Admitting: Physician Assistant

## 2020-11-03 DIAGNOSIS — E103593 Type 1 diabetes mellitus with proliferative diabetic retinopathy without macular edema, bilateral: Secondary | ICD-10-CM

## 2020-11-03 DIAGNOSIS — F909 Attention-deficit hyperactivity disorder, unspecified type: Secondary | ICD-10-CM

## 2020-11-03 DIAGNOSIS — Z760 Encounter for issue of repeat prescription: Secondary | ICD-10-CM

## 2020-11-07 ENCOUNTER — Encounter: Payer: Self-pay | Admitting: Physician Assistant

## 2020-11-07 ENCOUNTER — Other Ambulatory Visit: Payer: Self-pay

## 2020-11-07 ENCOUNTER — Ambulatory Visit: Payer: BC Managed Care – PPO | Admitting: Physician Assistant

## 2020-11-07 VITALS — BP 155/76 | HR 103 | Temp 98.6°F

## 2020-11-07 DIAGNOSIS — E7800 Pure hypercholesterolemia, unspecified: Secondary | ICD-10-CM

## 2020-11-07 DIAGNOSIS — F39 Unspecified mood [affective] disorder: Secondary | ICD-10-CM

## 2020-11-07 DIAGNOSIS — E1043 Type 1 diabetes mellitus with diabetic autonomic (poly)neuropathy: Secondary | ICD-10-CM

## 2020-11-07 DIAGNOSIS — I10 Essential (primary) hypertension: Secondary | ICD-10-CM

## 2020-11-07 DIAGNOSIS — D508 Other iron deficiency anemias: Secondary | ICD-10-CM

## 2020-11-07 DIAGNOSIS — F5101 Primary insomnia: Secondary | ICD-10-CM

## 2020-11-07 DIAGNOSIS — E103593 Type 1 diabetes mellitus with proliferative diabetic retinopathy without macular edema, bilateral: Secondary | ICD-10-CM | POA: Diagnosis not present

## 2020-11-07 DIAGNOSIS — F411 Generalized anxiety disorder: Secondary | ICD-10-CM

## 2020-11-07 DIAGNOSIS — Z1329 Encounter for screening for other suspected endocrine disorder: Secondary | ICD-10-CM | POA: Diagnosis not present

## 2020-11-07 DIAGNOSIS — F3340 Major depressive disorder, recurrent, in remission, unspecified: Secondary | ICD-10-CM

## 2020-11-07 DIAGNOSIS — Z79899 Other long term (current) drug therapy: Secondary | ICD-10-CM | POA: Diagnosis not present

## 2020-11-07 DIAGNOSIS — F331 Major depressive disorder, recurrent, moderate: Secondary | ICD-10-CM

## 2020-11-07 DIAGNOSIS — Z23 Encounter for immunization: Secondary | ICD-10-CM | POA: Diagnosis not present

## 2020-11-07 DIAGNOSIS — Z760 Encounter for issue of repeat prescription: Secondary | ICD-10-CM

## 2020-11-07 DIAGNOSIS — F909 Attention-deficit hyperactivity disorder, unspecified type: Secondary | ICD-10-CM

## 2020-11-07 DIAGNOSIS — Z89512 Acquired absence of left leg below knee: Secondary | ICD-10-CM

## 2020-11-07 LAB — POCT GLYCOSYLATED HEMOGLOBIN (HGB A1C): Hemoglobin A1C: 6.1 % — AB (ref 4.0–5.6)

## 2020-11-07 MED ORDER — DEXCOM G6 RECEIVER DEVI: 1.0000 | 0 refills | Status: DC

## 2020-11-07 NOTE — Patient Instructions (Signed)
Influenza (Flu) Vaccine (Inactivated or Recombinant): What You Need to Know 1. Why get vaccinated? Influenza vaccine can prevent influenza (flu). Flu is a contagious disease that spreads around the [place] every year, usually between October and May. Anyone can get the flu, but it is more dangerous for some people. Infants and young children, people 65 years and older, pregnant people, and people with certain health conditions or a weakened immune system are at greatest risk of flu complications. Pneumonia, bronchitis, sinus infections, and ear infections are examples of flu-related complications. If you have a medical condition, such as heart disease, cancer, or diabetes, flu can make it worse. Flu can cause fever and chills, sore throat, muscle aches, fatigue, cough, headache, and runny or stuffy nose. Some people may have vomiting and diarrhea, though this is more common in children than adults. In an average year, thousands of people in the [place] die from flu, and many more are hospitalized. Flu vaccine prevents millions of illnesses and flu-related visits to the doctor each year. 2. Influenza vaccines CDC recommends everyone 6 months and older get vaccinated every flu season. Children 6 months through 8 years of age may need 2 doses during a single flu season. Everyone else needs only 1 dose each flu season. It takes about 2 weeks for protection to develop after vaccination. There are many flu viruses, and they are always changing. Each year a new flu vaccine is made to protect against the influenza viruses believed to be likely to cause disease in the upcoming flu season. Even when the vaccine doesn't exactly match these viruses, it may still provide some protection. Influenza vaccine does not cause flu. Influenza vaccine may be given at the same time as other vaccines. 3. Talk with your health care provider Tell your vaccination provider if the person getting the vaccine: Has had  an allergic reaction after a previous dose of influenza vaccine, or has any severe, life-threatening allergies Has ever had Guillain-Barr Syndrome (also called "GBS") In some cases, your health care provider may decide to postpone influenza vaccination until a future visit. Influenza vaccine can be administered at any time during pregnancy. People who are or will be pregnant during influenza season should receive inactivated influenza vaccine. People with minor illnesses, such as a cold, may be vaccinated. People who are moderately or severely ill should usually wait until they recover before getting influenza vaccine. Your health care provider can give you more information. 4. Risks of a vaccine reaction Soreness, redness, and swelling where the shot is given, fever, muscle aches, and headache can happen after influenza vaccination. There may be a very small increased risk of Guillain-Barr Syndrome (GBS) after inactivated influenza vaccine (the flu shot). Young children who get the flu shot along with pneumococcal vaccine (PCV13) and/or DTaP vaccine at the same time might be slightly more likely to have a seizure caused by fever. Tell your health care provider if a child who is getting flu vaccine has ever had a seizure. People sometimes faint after medical procedures, including vaccination. Tell your provider if you feel dizzy or have vision changes or ringing in the ears. As with any medicine, there is a very remote chance of a vaccine causing a severe allergic reaction, other serious injury, or death. 5. What if there is a serious problem? An allergic reaction could occur after the vaccinated person leaves the clinic. If you see signs of a severe allergic reaction (hives, swelling of the face and throat, difficulty breathing,   a fast heartbeat, dizziness, or weakness), call 9-1-1 and get the person to the nearest hospital. For other signs that concern you, call your health care provider. Adverse  reactions should be reported to the Vaccine Adverse Event Reporting System (VAERS). Your health care provider will usually file this report, or you can do it yourself. Visit the VAERS website at www.vaers.hhs.gov or call 1-800-822-7967. VAERS is only for reporting reactions, and VAERS staff members do not give medical advice. 6. The National Vaccine Injury Compensation Program The National Vaccine Injury Compensation Program (VICP) is a federal program that was created to compensate people who may have been injured by certain vaccines. Claims regarding alleged injury or death due to vaccination have a time limit for filing, which may be as short as two years. Visit the VICP website at www.hrsa.gov/vaccinecompensation or call 1-800-338-2382 to learn about the program and about filing a claim. 7. How can I learn more? Ask your health care provider. Call your local or state health department. Visit the website of the Food and Drug Administration (FDA) for vaccine package inserts and additional information at www.fda.gov/vaccines-blood-biologics/vaccines. Contact the Centers for Disease Control and Prevention (CDC): Call 1-800-232-4636 (1-800-CDC-INFO) or Visit CDC's website at www.cdc.gov/flu. Vaccine Information Statement Inactivated Influenza Vaccine (09/30/2019) This information is not intended to replace advice given to you by your health care provider. Make sure you discuss any questions you have with your health care provider. Document Revised: 11/17/2019 Document Reviewed: 11/17/2019 Elsevier Patient Education  2022 [employer].  

## 2020-11-07 NOTE — Progress Notes (Signed)
Subjective:    Patient ID: Melissa [name], female    DOB: Sep 27, 1970, 50 y.o.   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   She spends a lot of time in [place] where her eye doctor and podiatrist are.   No CP, palpitations, headaches.  She checks her blood pressure at home and is running 1 28-1 38 over 70s.  Patient has a Dexcom and she manages her sugars very closely.  She does admit to some hypoglycemic events every once or twice a week. She usually catches and just eats or drinks but she does have glucagon pen if needed. She did have her left leg amputated for a wound that would not heal earlier this year in [place]. She is being fitted for prosthetic leg.    .. 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/2012   Toe osteomyelitis, left (HCC) 05/31/2012   Adult ADHD 08/09/2012   Neuropathy 03/25/2013   Cataract of both eyes 09/13/2013   Mood disorder (HCC) 09/13/2013   GAD (generalized anxiety disorder) 09/13/2013   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 (HCC) 09/13/2013   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) 04/26/2014   Vitreous hemorrhage of left eye (HCC) 04/26/2014   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) 04/26/2014   Right foot  pain 02/12/2015   Poor dentition 02/12/2015   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   Left foot pain 02/13/2015   DDD (degenerative disc disease), lumbar 02/13/2015   Swelling of lower extremity 07/13/2015   Pure hypercholesterolemia 05/22/2016   BMI 31.0-31.9,adult 11/20/2016   Lower extremity edema 05/20/2017   Sinus pressure 08/24/2017   Primary insomnia 03/13/2019   Medication management 09/17/2020   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) 11/19/2020   Resolved Ambulatory Problems    Diagnosis Date Noted   URI 07/03/2009   TRIGGER FINGER, LEFT THUMB 05/14/2009   FOOT SPRAIN, RIGHT 02/24/2010   FOOT INJURY, RIGHT 02/24/2010   ADHD 03/19/2010   Acute bronchitis 10/06/2010   Cough 10/06/2010   Wound, open, leg 12/25/2011   Gas gangrene of extremity (HCC) 02/05/2012   Cellulitis of leg, left 05/13/2012   Past Medical History:  Diagnosis Date   Depression    Diabetes mellitus 3-10   DM type 2 causing CKD stage 3 (HCC)    PTSD (post-traumatic stress disorder)    .[name] Family History  Problem Relation Age of Onset   Cancer Father        Lung   Transient ischemic attack Maternal Grandmother    Heart disease Maternal Grandmother        Developed in 70's   Diabetes Maternal Grandmother    Hypertension Maternal Grandmother       Review of Systems See HPI.     Objective:   Physical  Exam Vitals reviewed.  Constitutional:      Appearance: Normal appearance.     Comments: In a wheelchair  HENT:     Head: Normocephalic.  Neck:     Vascular: No carotid bruit.  Cardiovascular:     Rate and Rhythm: Normal rate and regular rhythm.     Pulses: Normal pulses.     Heart sounds: Normal heart sounds.  Pulmonary:     Effort: Pulmonary effort is normal.     Breath sounds: Normal breath sounds.  Musculoskeletal:     Comments: Below the knee amputation left leg.   Neurological:     General: No focal deficit present.     Mental Status: She is  alert and oriented to person, place, and time.  Psychiatric:        Mood and Affect: Mood normal.    .. Results for orders placed or performed in visit on 11/07/20  POCT glycosylated hemoglobin (Hb A1C)  Result Value Ref Range   Hemoglobin A1C 6.1 (A) 4.0 - 5.6 %   HbA1c POC (<> result, manual entry)     HbA1c, POC (prediabetic range)     HbA1c, POC (controlled diabetic range)           Assessment & Plan:  [name] KitchenMarland KitchenDeanna was seen today for diabetes.  Diagnoses and all orders for this visit:  Type 1 diabetes mellitus with proliferative retinopathy of both eyes without macular edema (HCC) -     POCT glycosylated hemoglobin (Hb A1C) -     COMPLETE METABOLIC PANEL WITH GFR -     Continuous Blood Gluc Receiver (DEXCOM G6 RECEIVER) DEVI; 1 Device by Does not apply route every 6 (six) months. -     insulin lispro (HUMALOG KWIKPEN) 100 UNIT/ML KwikPen; INJECT 10 TO 15 UNITS THREE TIMES A DAY WITH MEALS AND 3 TO 5 UNITS AS NEEDED ONCE DAILY IF BLOOD SUGAR IS GREATER THAN 220 MG/DL -     Glucagon 1 LO/7.5IE SOLN; Inject 1 mg into the skin as needed.  Essential hypertension, benign -     COMPLETE METABOLIC PANEL WITH GFR -     lisinopril (ZESTRIL) 5 MG tablet; Take 1 tablet (5 mg total) by mouth daily.  Pure hypercholesterolemia -     Lipid Panel w/reflex Direct LDL -     atorvastatin (LIPITOR) 80 MG tablet; Take 1 tablet (80 mg total) by mouth daily.  Thyroid disorder screen -     TSH  Medication management -     POCT glycosylated hemoglobin (Hb A1C) -     TSH -     Lipid Panel w/reflex Direct LDL -     COMPLETE METABOLIC PANEL WITH GFR -     CBC with Differential/Platelet  Need for influenza vaccination -     Flu Vaccine QUAD 40mo+IM (Fluarix, Fluzone & Alfiuria Quad PF)  Medication refill -     lisinopril (ZESTRIL) 5 MG tablet; Take 1 tablet (5 mg total) by mouth daily. -     ALPRAZolam (XANAX) 1 MG tablet; TAKE 1 TABLET BY MOUTH 3 TIMES A DAY -     buPROPion (WELLBUTRIN XL)  150 MG 24 hr tablet; Take 1 tablet (150 mg total) by mouth daily. -     buPROPion (WELLBUTRIN XL) 300 MG 24 hr tablet; Take 1 tablet (300 mg total) by mouth daily. -     traMADol (ULTRAM) 50 MG tablet; 1-2 tabs by mouth Q8 hours, maximum 6 tabs per day. -  lamoTRIgine (LAMICTAL) 150 MG tablet; Take 1 tablet (150 mg total) by mouth daily. -     amitriptyline (ELAVIL) 10 MG tablet; 4 tablets at bedtime PRN insomnia -     zolpidem (AMBIEN CR) 12.5 MG CR tablet; TAKE 1 TABLET BY MOUTH AT BEDTIME -     atorvastatin (LIPITOR) 80 MG tablet; Take 1 tablet (80 mg total) by mouth daily. -     insulin detemir (LEVEMIR FLEXTOUCH) 100 UNIT/ML FlexPen; INJECT 15 units  UNDER THE SKIN 3 times DAILY  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 (HCC) -     lisinopril (ZESTRIL) 5 MG tablet; Take 1 tablet (5 mg total) by mouth daily. -     insulin detemir (LEVEMIR FLEXTOUCH) 100 UNIT/ML FlexPen; INJECT 15 units  UNDER THE SKIN 3 times DAILY  GAD (generalized anxiety disorder) -     ALPRAZolam (XANAX) 1 MG tablet; TAKE 1 TABLET BY MOUTH 3 TIMES A DAY -     buPROPion (WELLBUTRIN XL) 150 MG 24 hr tablet; Take 1 tablet (150 mg total) by mouth daily. -     buPROPion (WELLBUTRIN XL) 300 MG 24 hr tablet; Take 1 tablet (300 mg total) by mouth daily.  Recurrent major depressive disorder, in remission (HCC) -     buPROPion (WELLBUTRIN XL) 150 MG 24 hr tablet; Take 1 tablet (150 mg total) by mouth daily. -     buPROPion (WELLBUTRIN XL) 300 MG 24 hr tablet; Take 1 tablet (300 mg total) by mouth daily.  Mood disorder (HCC) -     lamoTRIgine (LAMICTAL) 150 MG tablet; Take 1 tablet (150 mg total) by mouth daily.  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  A1C to goal.  Refilled medications.  BP not to goal. On ACE. Per patient checking at home and under 130/80. Will report back readings.  On STATIN.  Has eye doctor in [facility]. Known retinopathy.  Has podiatry. Recent amputation of left below knee leg.  Flu shot given today.  UTD covid and pneumonia vaccine.  Glucagon sent for hypoglycemic events. Follow up in 3 months next a1c.   Mood controlled- refilled medications.   Insomnia- refilled ambien.

## 2020-11-12 MED ORDER — ALPRAZOLAM 1 MG PO TABS: ORAL_TABLET | ORAL | 0 refills | Status: DC

## 2020-11-12 MED ORDER — LISINOPRIL 5 MG PO TABS: 5.0000 mg | ORAL_TABLET | Freq: Every day | ORAL | 1 refills | Status: DC

## 2020-11-12 MED ORDER — ZOLPIDEM TARTRATE ER 12.5 MG PO TBCR: EXTENDED_RELEASE_TABLET | ORAL | 0 refills | Status: DC

## 2020-11-12 MED ORDER — BUPROPION HCL ER (XL) 300 MG PO TB24: 300.0000 mg | ORAL_TABLET | Freq: Every day | ORAL | 1 refills | Status: DC

## 2020-11-12 MED ORDER — TRAMADOL HCL 50 MG PO TABS: ORAL_TABLET | ORAL | 0 refills | Status: DC

## 2020-11-12 MED ORDER — BUPROPION HCL ER (XL) 150 MG PO TB24: 150.0000 mg | ORAL_TABLET | Freq: Every day | ORAL | 1 refills | Status: DC

## 2020-11-12 MED ORDER — LEVEMIR FLEXTOUCH 100 UNIT/ML ~~LOC~~ SOPN: PEN_INJECTOR | SUBCUTANEOUS | 1 refills | Status: DC

## 2020-11-12 MED ORDER — AMITRIPTYLINE HCL 10 MG PO TABS: ORAL_TABLET | ORAL | 1 refills | Status: DC

## 2020-11-12 MED ORDER — FERROUS SULFATE 325 (65 FE) MG PO TBEC: 325.0000 mg | DELAYED_RELEASE_TABLET | Freq: Every day | ORAL | 3 refills | Status: DC

## 2020-11-12 MED ORDER — INSULIN LISPRO (1 UNIT DIAL) 100 UNIT/ML (KWIKPEN): PEN_INJECTOR | SUBCUTANEOUS | 0 refills | Status: DC

## 2020-11-12 MED ORDER — ATORVASTATIN CALCIUM 80 MG PO TABS: 80.0000 mg | ORAL_TABLET | Freq: Every day | ORAL | 3 refills | Status: DC

## 2020-11-12 MED ORDER — LAMOTRIGINE 150 MG PO TABS: 150.0000 mg | ORAL_TABLET | Freq: Every day | ORAL | 4 refills | Status: DC

## 2020-11-19 DIAGNOSIS — Z89512 Acquired absence of left leg below knee: Secondary | ICD-10-CM | POA: Insufficient documentation

## 2020-11-19 MED ORDER — LISDEXAMFETAMINE DIMESYLATE 70 MG PO CAPS: 70.0000 mg | ORAL_CAPSULE | ORAL | 0 refills | Status: DC

## 2020-11-19 MED ORDER — GLUCAGON 1 MG/0.2ML ~~LOC~~ SOLN: 1.0000 mg | SUBCUTANEOUS | 1 refills | Status: DC | PRN

## 2020-11-19 NOTE — Telephone Encounter (Signed)
Seen in office 9/14.  Needs [facility].  Pended below.

## 2020-11-23 ENCOUNTER — Other Ambulatory Visit: Payer: Self-pay | Admitting: Physician Assistant

## 2020-11-23 ENCOUNTER — Other Ambulatory Visit: Payer: Self-pay

## 2020-11-23 ENCOUNTER — Other Ambulatory Visit: Payer: Self-pay | Admitting: Neurology

## 2020-11-23 DIAGNOSIS — Z760 Encounter for issue of repeat prescription: Secondary | ICD-10-CM

## 2020-11-23 DIAGNOSIS — E103593 Type 1 diabetes mellitus with proliferative diabetic retinopathy without macular edema, bilateral: Secondary | ICD-10-CM

## 2020-11-23 DIAGNOSIS — E1043 Type 1 diabetes mellitus with diabetic autonomic (poly)neuropathy: Secondary | ICD-10-CM

## 2020-11-23 MED ORDER — LEVEMIR FLEXTOUCH 100 UNIT/ML ~~LOC~~ SOPN: PEN_INJECTOR | SUBCUTANEOUS | 0 refills | Status: DC

## 2020-11-23 MED ORDER — INSULIN LISPRO (1 UNIT DIAL) 100 UNIT/ML (KWIKPEN): PEN_INJECTOR | SUBCUTANEOUS | 0 refills | Status: DC

## 2020-11-23 MED ORDER — LEVEMIR FLEXTOUCH 100 UNIT/ML ~~LOC~~ SOPN: 68.0000 [IU] | PEN_INJECTOR | Freq: Two times a day (BID) | SUBCUTANEOUS | 0 refills | Status: DC

## 2020-11-23 NOTE — Progress Notes (Signed)
Instructions for Humalog were sent for Levemir insulin   Fixed prescription, [facility], 30 days to local [facility].

## 2020-11-25 ENCOUNTER — Encounter: Payer: Self-pay | Admitting: Physician Assistant

## 2020-11-25 DIAGNOSIS — E103593 Type 1 diabetes mellitus with proliferative diabetic retinopathy without macular edema, bilateral: Secondary | ICD-10-CM

## 2020-11-25 DIAGNOSIS — B377 Candidal sepsis: Secondary | ICD-10-CM

## 2020-11-26 MED ORDER — FLUCONAZOLE 150 MG PO TABS: 150.0000 mg | ORAL_TABLET | Freq: Every day | ORAL | 1 refills | Status: DC

## 2020-11-26 MED ORDER — DEXCOM G6 RECEIVER DEVI: 1.0000 | 0 refills | Status: DC

## 2020-11-28 NOTE — Telephone Encounter (Signed)
Can we type this up for patient. Just say that pt reports.Melissa KitchenMarland KitchenMarland [name]

## 2021-02-06 ENCOUNTER — Telehealth: Payer: BC Managed Care – PPO | Admitting: Physician Assistant

## 2021-02-11 ENCOUNTER — Encounter: Payer: Self-pay | Admitting: Physician Assistant

## 2021-02-11 ENCOUNTER — Telehealth (INDEPENDENT_AMBULATORY_CARE_PROVIDER_SITE_OTHER): Payer: BC Managed Care – PPO | Admitting: Physician Assistant

## 2021-02-11 VITALS — BP 128/76 | HR 98

## 2021-02-11 DIAGNOSIS — F909 Attention-deficit hyperactivity disorder, unspecified type: Secondary | ICD-10-CM

## 2021-02-11 DIAGNOSIS — Z760 Encounter for issue of repeat prescription: Secondary | ICD-10-CM | POA: Diagnosis not present

## 2021-02-11 DIAGNOSIS — F411 Generalized anxiety disorder: Secondary | ICD-10-CM | POA: Diagnosis not present

## 2021-02-11 DIAGNOSIS — Y752 Prosthetic and other implants, materials and neurological devices associated with adverse incidents: Secondary | ICD-10-CM

## 2021-02-11 NOTE — Progress Notes (Signed)
..Virtual Visit via Video Note  I connected with Melissa [name] [place] on 02/11/2021 at  3:00 PM EST by a video enabled telemedicine application and verified that I am speaking with the correct person using two identifiers.  Location: Patient: home Provider: clinic  .[name] KitchenParticipating in visit:  Patient: Melissa [name] Provider: Tandy Gaw PA-C   I discussed the limitations of evaluation and management by telemedicine and the availability of in person appointments. The patient expressed understanding and agreed to proceed.  History of Present Illness: Pt is a 50 yo female with type 1 diabetes with left leg amputation, ADHD, GAD, MDD who calls into the clinic for vyvanse refills.   Pt is doing well. She is doing a little better with new prosthetic leg for last 2 weeks but still having problems walking.She likes it better but still having a lot of motility issues. She thinks prostheic PT could help.   No problems with vyvanse. No concerns. Pt is sleeping with no increase in anxiety.    .. 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/2012   Toe osteomyelitis, left (HCC) 05/31/2012   Adult ADHD 08/09/2012   Neuropathy 03/25/2013   Cataract of both eyes 09/13/2013   Mood disorder (HCC) 09/13/2013   GAD (generalized anxiety disorder) 09/13/2013   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 (HCC) 09/13/2013   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) 04/26/2014   Vitreous hemorrhage of left eye (HCC) 04/26/2014   Type 1 diabetes mellitus with  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 (HCC) 04/26/2014   Right foot pain 02/12/2015   Poor dentition 02/12/2015   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   Left foot pain 02/13/2015   DDD (degenerative disc disease), lumbar 02/13/2015   Swelling of lower extremity 07/13/2015   Pure hypercholesterolemia 05/22/2016   BMI 31.0-31.9,adult 11/20/2016   Lower extremity edema 05/20/2017   Sinus pressure 08/24/2017   Primary insomnia 03/13/2019   Medication management 09/17/2020   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) 11/19/2020   Prosthetic and other implants, materials and neurological devices associated with adverse incidents 02/12/2021   Resolved Ambulatory Problems    Diagnosis Date Noted   URI 07/03/2009   TRIGGER FINGER, LEFT THUMB 05/14/2009   FOOT SPRAIN, RIGHT 02/24/2010   FOOT INJURY, RIGHT 02/24/2010   ADHD 03/19/2010   Acute bronchitis 10/06/2010   Cough 10/06/2010   Wound, open, leg 12/25/2011   Gas gangrene of extremity (HCC) 02/05/2012   Cellulitis of leg, left 05/13/2012   Past Medical History:  Diagnosis Date   Depression    Diabetes mellitus 3-10   DM type 2 causing CKD stage 3 (HCC)    PTSD (post-traumatic stress disorder)     Observations/Objective: No acute distress Normal mood  Normal breathing  .[name] Today's Vitals   02/11/21 1500  BP: 128/76  Pulse: 98   There is no height or weight on file to calculate BMI.  .. Depression screen [facility] 2/9 05/04/2020 01/31/2020 06/28/2019 03/08/2019 10/13/2018  Decreased Interest 1 1 0 0 0  Down, Depressed, Hopeless  0 1 1 2 1   PHQ - 2 Score 1 2 1 2 1   Altered sleeping 3 3 3 3 3   Tired, decreased energy 1 1 1 1 1   Change in appetite 0 1 0 0 0  Feeling bad or failure about yourself  1 1 1  0 1  Trouble concentrating 1 3 2 2 3   Moving slowly or fidgety/restless 0 3 0 0 1  Suicidal thoughts 0 0 0 0 0  PHQ-9 Score 7 14 8 8 10   Difficult doing work/chores Somewhat difficult -  Somewhat difficult Somewhat difficult Very difficult  Some recent data might be hidden      Assessment and Plan:  Kalyssa was seen today for follow-up and diabetes.  Diagnoses and all orders for this visit:  Adult ADHD  Medication refill -     ALPRAZolam (XANAX) 1 MG tablet; TAKE 1 TABLET BY MOUTH 3 TIMES A DAY  GAD (generalized anxiety disorder) -     ALPRAZolam (XANAX) 1 MG tablet; TAKE 1 TABLET BY MOUTH 3 TIMES A DAY  Prosthetic and other implants, materials and neurological devices associated with adverse incidents  Pended vyvanse for metheney to sign for 90 days.   Will make referral if needed for PT.   Xanax refilled.       Follow Up Instructions:    I discussed the assessment and treatment plan with the patient. The patient was provided an opportunity to ask questions and all were answered. The patient agreed with the plan and demonstrated an understanding of the instructions.   The patient was advised to call back or seek an in-person evaluation if the symptoms worsen or if the condition fails to improve as anticipated.   , PA-C

## 2021-02-12 ENCOUNTER — Other Ambulatory Visit: Payer: Self-pay | Admitting: Physician Assistant

## 2021-02-12 DIAGNOSIS — E103593 Type 1 diabetes mellitus with proliferative diabetic retinopathy without macular edema, bilateral: Secondary | ICD-10-CM

## 2021-02-12 DIAGNOSIS — Z760 Encounter for issue of repeat prescription: Secondary | ICD-10-CM

## 2021-02-12 DIAGNOSIS — F5101 Primary insomnia: Secondary | ICD-10-CM

## 2021-02-12 DIAGNOSIS — F909 Attention-deficit hyperactivity disorder, unspecified type: Secondary | ICD-10-CM

## 2021-02-12 DIAGNOSIS — Y752 Prosthetic and other implants, materials and neurological devices associated with adverse incidents: Secondary | ICD-10-CM | POA: Insufficient documentation

## 2021-02-12 MED ORDER — LISDEXAMFETAMINE DIMESYLATE 70 MG PO CAPS: 70.0000 mg | ORAL_CAPSULE | ORAL | 0 refills | Status: DC

## 2021-02-12 MED ORDER — ALPRAZOLAM 1 MG PO TABS: ORAL_TABLET | ORAL | 1 refills | Status: DC

## 2021-02-12 NOTE — Telephone Encounter (Signed)
Visit yesterday for medication refills on vyvanse No problems .[name] KitchenPDMP reviewed during this encounter. Needs 90 day vyanse  P[facility]

## 2021-02-13 NOTE — Telephone Encounter (Signed)
Last written 11/12/2020 #90 no refills Last appt 2 days ago

## 2021-02-27 ENCOUNTER — Encounter: Payer: Self-pay | Admitting: Physician Assistant

## 2021-02-27 DIAGNOSIS — Y752 Prosthetic and other implants, materials and neurological devices associated with adverse incidents: Secondary | ICD-10-CM

## 2021-02-27 DIAGNOSIS — Z89512 Acquired absence of left leg below knee: Secondary | ICD-10-CM

## 2021-02-27 NOTE — Telephone Encounter (Signed)
Can we sent PT order for gait training with new prothesis for 4 days a week.

## 2021-02-28 ENCOUNTER — Telehealth: Payer: BC Managed Care – PPO | Admitting: Physician Assistant

## 2021-02-28 NOTE — Progress Notes (Signed)
Patient no-showed for visit. A message was sent letting her know we do not prescribe weight loss medications and to follow-up with her PCP

## 2021-02-28 NOTE — Telephone Encounter (Signed)
Order placed with details in referral. Cindy - please send.

## 2021-03-01 NOTE — Telephone Encounter (Signed)
Patient left a vm stating she got a call from a physical therapist in [place]. Did this get sent to [place] as requested?

## 2021-03-13 ENCOUNTER — Encounter: Payer: Self-pay | Admitting: Physician Assistant

## 2021-03-13 NOTE — Telephone Encounter (Signed)
Can you help with this?

## 2021-03-27 ENCOUNTER — Encounter: Payer: Self-pay | Admitting: Neurology

## 2021-03-27 NOTE — Telephone Encounter (Signed)
Mychart message sent to patient.

## 2021-03-29 NOTE — Telephone Encounter (Signed)
Patient's husband also called wanted to set up a virtual visit to discuss the prosthesis. CB number [phone].

## 2021-03-29 NOTE — Telephone Encounter (Signed)
Would this order come from [place]?

## 2021-04-01 NOTE — Telephone Encounter (Signed)
Patient is scheduled for tomorrow for virtual visit to discuss.

## 2021-04-02 ENCOUNTER — Encounter: Payer: Self-pay | Admitting: Physician Assistant

## 2021-04-02 ENCOUNTER — Telehealth (INDEPENDENT_AMBULATORY_CARE_PROVIDER_SITE_OTHER): Payer: BC Managed Care – PPO | Admitting: Physician Assistant

## 2021-04-02 DIAGNOSIS — Y752 Prosthetic and other implants, materials and neurological devices associated with adverse incidents: Secondary | ICD-10-CM | POA: Diagnosis not present

## 2021-04-02 DIAGNOSIS — E1043 Type 1 diabetes mellitus with diabetic autonomic (poly)neuropathy: Secondary | ICD-10-CM | POA: Diagnosis not present

## 2021-04-02 DIAGNOSIS — Z89512 Acquired absence of left leg below knee: Secondary | ICD-10-CM | POA: Diagnosis not present

## 2021-04-02 MED ORDER — AMBULATORY NON FORMULARY MEDICATION: 0 refills | Status: DC

## 2021-04-02 NOTE — Progress Notes (Signed)
Needs visit to send note along with RX for new DME prosthesis, since swelling has gone down on leg her prosthesis now does not fit and she is not able to use this or go to PT. Needs RX sent to [facility] at fax#[phone]

## 2021-04-02 NOTE — Progress Notes (Signed)
..Virtual Visit via Video Note  I connected with [name] on 04/02/21 at 11:10 AM EST by a video enabled telemedicine application and verified that I am speaking with the correct person using two identifiers.  Location: Patient: home Provider: clinic  .[name] KitchenParticipating in visit:  Patient: Melissa [name] Provider: Tandy Gaw PA-C Provider in training: Colbert Coyer PA-S   I discussed the limitations of evaluation and management by telemedicine and the availability of in person appointments. The patient expressed understanding and agreed to proceed.  History of Present Illness: Pt is a 51 yo female with Type I diabetes and left lower leg amputation who calls into the clinic needing rx for prothesis. She had a prothesis but her edema in leg has decreased and now it does not fit. Since it does not fit it keeps falling off and not allowing her to rehab with PT. She has lost 3 days in PT due to this. She is striving to get back to her normal activities such as shopping, walking, going to gym but needs to get stronger on her leg. She is using dexcom and sugars are running 7.1 on average. She is no other complaints.   .. 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/2012   Toe osteomyelitis, left (HCC) 05/31/2012   Adult ADHD 08/09/2012   Neuropathy 03/25/2013   Cataract of both eyes 09/13/2013   Mood disorder (HCC) 09/13/2013   GAD (generalized anxiety disorder) 09/13/2013   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 (HCC) 09/13/2013   Retinal ischemia due to type  1 diabetes mellitus (HCC) 04/26/2014   Vitreous hemorrhage of left eye (HCC) 04/26/2014   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) 04/26/2014   Right foot pain 02/12/2015   Poor dentition 02/12/2015   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   Left foot pain 02/13/2015   DDD (degenerative disc disease), lumbar 02/13/2015   Swelling of lower extremity 07/13/2015   Pure hypercholesterolemia 05/22/2016   BMI 31.0-31.9,adult 11/20/2016   Lower extremity edema 05/20/2017   Sinus pressure 08/24/2017   Primary insomnia 03/13/2019   Medication management 09/17/2020   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) 11/19/2020   Prosthetic and other implants, materials and neurological devices associated with adverse incidents 02/12/2021   Resolved Ambulatory Problems    Diagnosis Date Noted   URI 07/03/2009   TRIGGER FINGER, LEFT THUMB 05/14/2009   FOOT SPRAIN, RIGHT 02/24/2010   FOOT INJURY, RIGHT 02/24/2010   ADHD 03/19/2010   Acute bronchitis 10/06/2010   Cough 10/06/2010   Wound, open, leg 12/25/2011   Gas gangrene of extremity (HCC) 02/05/2012   Cellulitis of leg, left 05/13/2012   Past Medical History:  Diagnosis Date   Depression    Diabetes mellitus 3-10   DM type 2 causing CKD stage 3 (HCC)    PTSD (post-traumatic stress disorder)       Observations/Objective: No acute distress Normal mood and appearance No labored breathing  .[name] KitchenThere were no vitals filed for this visit. There is no height or weight on file to  calculate BMI.    Assessment and Plan: [name] KitchenMarland KitchenDeanna was seen today for follow-up.  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  Continue PT.  Continue closely monitoring sugars.     Follow Up Instructions:    I discussed the assessment and treatment plan with the patient. The patient was provided an opportunity to ask questions and all were answered. The patient agreed with the plan and demonstrated an understanding of the instructions.   The patient was advised to call back or seek an in-person evaluation if the symptoms worsen or if the condition fails to improve as anticipated.    Tandy Gaw, PA-C

## 2021-04-22 ENCOUNTER — Other Ambulatory Visit: Payer: Self-pay | Admitting: Physician Assistant

## 2021-04-22 DIAGNOSIS — F5101 Primary insomnia: Secondary | ICD-10-CM

## 2021-04-22 DIAGNOSIS — Z760 Encounter for issue of repeat prescription: Secondary | ICD-10-CM

## 2021-04-29 ENCOUNTER — Other Ambulatory Visit: Payer: Self-pay

## 2021-04-29 DIAGNOSIS — E103593 Type 1 diabetes mellitus with proliferative diabetic retinopathy without macular edema, bilateral: Secondary | ICD-10-CM

## 2021-04-29 DIAGNOSIS — E1043 Type 1 diabetes mellitus with diabetic autonomic (poly)neuropathy: Secondary | ICD-10-CM

## 2021-04-29 DIAGNOSIS — Z760 Encounter for issue of repeat prescription: Secondary | ICD-10-CM

## 2021-04-29 MED ORDER — INSULIN LISPRO (1 UNIT DIAL) 100 UNIT/ML (KWIKPEN): PEN_INJECTOR | SUBCUTANEOUS | 3 refills | Status: DC

## 2021-04-29 MED ORDER — LEVEMIR FLEXTOUCH 100 UNIT/ML ~~LOC~~ SOPN: PEN_INJECTOR | SUBCUTANEOUS | 0 refills | Status: DC

## 2021-04-30 ENCOUNTER — Telehealth: Payer: Self-pay | Admitting: Neurology

## 2021-04-30 NOTE — Telephone Encounter (Signed)
Form received, signed and faxed back with confirmation received.  ?

## 2021-04-30 NOTE — Telephone Encounter (Signed)
Patient called stating Melissa [name] needs form signed and sent back, we have not received a form. Gave them our fax number. Will call if needed.  ?

## 2021-05-08 ENCOUNTER — Ambulatory Visit: Payer: BC Managed Care – PPO | Admitting: Physician Assistant

## 2021-05-17 ENCOUNTER — Ambulatory Visit: Payer: BC Managed Care – PPO | Admitting: Physician Assistant

## 2021-05-25 ENCOUNTER — Other Ambulatory Visit: Payer: Self-pay | Admitting: Physician Assistant

## 2021-05-25 DIAGNOSIS — E1043 Type 1 diabetes mellitus with diabetic autonomic (poly)neuropathy: Secondary | ICD-10-CM

## 2021-05-25 DIAGNOSIS — Z760 Encounter for issue of repeat prescription: Secondary | ICD-10-CM

## 2021-05-28 ENCOUNTER — Ambulatory Visit (INDEPENDENT_AMBULATORY_CARE_PROVIDER_SITE_OTHER): Payer: BC Managed Care – PPO | Admitting: Physician Assistant

## 2021-05-28 ENCOUNTER — Encounter: Payer: Self-pay | Admitting: Physician Assistant

## 2021-05-28 VITALS — BP 135/63 | HR 90

## 2021-05-28 DIAGNOSIS — E1043 Type 1 diabetes mellitus with diabetic autonomic (poly)neuropathy: Secondary | ICD-10-CM

## 2021-05-28 DIAGNOSIS — Z760 Encounter for issue of repeat prescription: Secondary | ICD-10-CM | POA: Diagnosis not present

## 2021-05-28 DIAGNOSIS — E103593 Type 1 diabetes mellitus with proliferative diabetic retinopathy without macular edema, bilateral: Secondary | ICD-10-CM

## 2021-05-28 DIAGNOSIS — F909 Attention-deficit hyperactivity disorder, unspecified type: Secondary | ICD-10-CM

## 2021-05-28 DIAGNOSIS — I10 Essential (primary) hypertension: Secondary | ICD-10-CM

## 2021-05-28 DIAGNOSIS — F411 Generalized anxiety disorder: Secondary | ICD-10-CM | POA: Diagnosis not present

## 2021-05-28 DIAGNOSIS — F3340 Major depressive disorder, recurrent, in remission, unspecified: Secondary | ICD-10-CM | POA: Diagnosis not present

## 2021-05-28 DIAGNOSIS — H02729 Madarosis of unspecified eye, unspecified eyelid and periocular area: Secondary | ICD-10-CM

## 2021-05-28 DIAGNOSIS — Z89512 Acquired absence of left leg below knee: Secondary | ICD-10-CM

## 2021-05-28 LAB — POCT GLYCOSYLATED HEMOGLOBIN (HGB A1C): Hemoglobin A1C: 6.7 % — AB (ref 4.0–5.6)

## 2021-05-28 MED ORDER — DEXCOM G6 SENSOR MISC: 3 refills | Status: DC

## 2021-05-28 MED ORDER — BIMATOPROST 0.03 % EX SOLN: 1.0000 "application " | Freq: Every day | CUTANEOUS | 4 refills | Status: DC

## 2021-05-28 MED ORDER — INSULIN LISPRO (1 UNIT DIAL) 100 UNIT/ML (KWIKPEN): PEN_INJECTOR | SUBCUTANEOUS | 3 refills | Status: DC

## 2021-05-28 MED ORDER — BUPROPION HCL ER (XL) 300 MG PO TB24: 300.0000 mg | ORAL_TABLET | Freq: Every day | ORAL | 1 refills | Status: DC

## 2021-05-28 MED ORDER — DEXCOM G6 RECEIVER DEVI: 1.0000 | 0 refills | Status: DC

## 2021-05-28 MED ORDER — LISINOPRIL 5 MG PO TABS: 5.0000 mg | ORAL_TABLET | Freq: Every day | ORAL | 1 refills | Status: DC

## 2021-05-28 MED ORDER — DEXCOM G6 TRANSMITTER MISC: 1.0000 [IU] | 3 refills | Status: DC

## 2021-05-28 MED ORDER — BUPROPION HCL ER (XL) 150 MG PO TB24: 150.0000 mg | ORAL_TABLET | Freq: Every day | ORAL | 1 refills | Status: DC

## 2021-05-28 MED ORDER — LISDEXAMFETAMINE DIMESYLATE 70 MG PO CAPS: 70.0000 mg | ORAL_CAPSULE | ORAL | 0 refills | Status: DC

## 2021-05-28 NOTE — Progress Notes (Signed)
? ?Subjective:  ? ? Patient ID: Melissa [name], female    DOB: [birth year], 51 y.o.   MRN: [id number] ? ?HPI ?Pt is a 51 yo female who presents to the clinic for medication refills.  ? ?Recently had pap done in [facility] with Dr. Glennon Mac. New IUD placed. Mammogram done.  ? ?Prosthetic broke will get a new one and then start PT again.  ? ?Request latisse for eye lashes.  ? ?Checking sugars and dose adjusting. Pt has had a few lows but has glucagon. Dr. Iver Nestle podiatry. Current Levemir is 68 units bid. Humalog 20-25units with meals.  ? ?.. ?Active Ambulatory Problems  ?  Diagnosis Date Noted  ? Obesity 11/23/2009  ? Anxiety 11/12/2006  ? INSOMNIA, CHRONIC 12/16/2006  ? MDD (major depressive disorder), recurrent episode, moderate ([place]) 11/12/2006  ? Essential hypertension, benign 11/23/2009  ? Non-seasonal allergic rhinitis due to pollen 07/03/2009  ? WEIGHT GAIN 11/12/2006  ? LUMBAGO 03/19/2010  ? Eczema 03/26/2011  ? Carpal tunnel syndrome 03/26/2011  ? Anemia 02/05/2012  ? Post-traumatic wound infection 05/13/2012  ? Toe osteomyelitis, left ([place]) 05/31/2012  ? Adult ADHD 08/09/2012  ? Neuropathy 03/25/2013  ? Cataract of both eyes 09/13/2013  ? Mood disorder ([place]) 09/13/2013  ? GAD (generalized anxiety disorder) 09/13/2013  ? 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 Vitreous hemorrhage of left eye ([place]) 04/26/2014  ? 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 Right foot pain 02/12/2015  ? Poor dentition 02/12/2015  ? 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  ? Left foot pain 02/13/2015  ? DDD (degenerative disc disease), lumbar 02/13/2015  ? Swelling of lower extremity 07/13/2015  ? Pure hypercholesterolemia 05/22/2016  ? BMI 31.0-31.9,adult 11/20/2016  ? Lower extremity edema 05/20/2017  ? Sinus pressure 08/24/2017  ? Primary insomnia 03/13/2019  ?  Medication management 09/17/2020  ? 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 Prosthetic and other implants, materials and neurological devices associated with adverse incidents 02/12/2021  ? Recurrent major depressive disorder, in remission ([place]) 05/28/2021  ? 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 ?Resolved Ambulatory Problems  ?  Diagnosis Date Noted  ? URI 07/03/2009  ? TRIGGER FINGER, LEFT THUMB 05/14/2009  ? FOOT SPRAIN, RIGHT 02/24/2010  ? FOOT INJURY, RIGHT 02/24/2010  ? ADHD 03/19/2010  ? Acute bronchitis 10/06/2010  ? Cough 10/06/2010  ? Wound, open, leg 12/25/2011  ? 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 Cellulitis of leg, left 05/13/2012  ? ?Past Medical History:  ?Diagnosis Date  ? Depression   ? Diabetes mellitus 3-10  ? DM type 2 causing CKD stage 3 (HCC)   ? PTSD (post-traumatic stress disorder)   ? ? ? ?Review of Systems ? ?  See HPI.  ?Objective:  ? Physical Exam ?Vitals reviewed.  ?Constitutional:   ?   Appearance: Normal appearance.  ?HENT:  ?   Head: Normocephalic.  ?Cardiovascular:  ?   Rate and Rhythm: Normal rate and regular rhythm.  ?   Pulses: Normal pulses.  ?   Heart sounds: Normal heart sounds.  ?Pulmonary:  ?   Effort: Pulmonary effort is normal.  ?   Breath sounds: Normal breath sounds.  ?Musculoskeletal:  ?   Right lower leg: No edema.  ?   Comments: Left leg amputation below knee.   ?Lymphadenopathy:  ?  Cervical: No cervical adenopathy.  ?Neurological:  ?   General: No focal deficit present.  ?   Mental Status: She is alert and oriented to person, place, and time.  ?Psychiatric:     ?   Mood and Affect: Mood normal.  ? ? ? ? ?. ?Results for orders placed or performed in visit on 05/28/21  ?POCT glycosylated hemoglobin (Hb A1C)  ?Result Value Ref Range  ? Hemoglobin A1C 6.7 (A) 4.0 - 5.6 %  ? HbA1c POC (<> result, manual entry)    ? HbA1c, POC (prediabetic range)    ? HbA1c, POC (controlled diabetic range)    ? ? ?   ?Assessment & Plan:   ?..Melissa [name] was seen today for follow-up. ? ?Diagnoses and all orders for this visit: ? 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 ?-     lisinopril (ZESTRIL) 5 MG tablet; Take 1 tablet (5 mg total) by mouth daily. ? ?Medication refill ?-     buPROPion (WELLBUTRIN XL) 150 MG 24 hr tablet; Take 1 tablet (150 mg total) by mouth daily. ?-     buPROPion (WELLBUTRIN XL) 300 MG 24 hr tablet; Take 1 tablet (300 mg total) by mouth daily. ?-     Discontinue: Continuous Blood Gluc Sensor (DEXCOM G6 SENSOR) MISC; CHANGE SENSOR EVERY 10 DAYS ?-     Continuous Blood Gluc Transmit (DEXCOM G6 TRANSMITTER) MISC; 1 Units by Does not apply route every 3 (three) months. ?-     lisdexamfetamine (VYVANSE) 70 MG capsule; Take 1 capsule (70 mg total) by mouth every morning. ?-     lisinopril (ZESTRIL) 5 MG tablet; Take 1 tablet (5 mg total) by mouth daily. ? ?GAD (generalized anxiety disorder) ?-     buPROPion (WELLBUTRIN XL) 150 MG 24 hr tablet; Take 1 tablet (150 mg total) by mouth daily. ?-     buPROPion (WELLBUTRIN XL) 300 MG 24 hr tablet; Take 1 tablet (300 mg total) by mouth daily. ? ?Recurrent major depressive disorder, in remission (HCC) ?-     buPROPion (WELLBUTRIN XL) 150 MG 24 hr tablet; Take 1 tablet (150 mg total) by mouth daily. ?-     buPROPion (WELLBUTRIN XL) 300 MG 24 hr tablet; Take 1 tablet (300 mg total) by mouth daily. ? 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 ?-     insulin lispro (HUMALOG KWIKPEN) 100 UNIT/ML KwikPen; INJECT 20 to 25  UNITS THREE TIMES A DAY WITH MEALS AND 3 TO 5 UNITS AS NEEDED ONCE DAILY IF BLOOD SUGAR IS GREATER THAN 220 MG/DL ? ?Adult ADHD ?-     lisdexamfetamine (VYVANSE) 70 MG capsule; Take 1 capsule (70 mg total) by mouth every morning. ? ?Essential hypertension, benign ?-     lisinopril (ZESTRIL) 5 MG tablet; Take 1 tablet (5 mg total) by mouth daily. ? 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 ?Hypotrichosis of eyelid, unspecified laterality ?-     bimatoprost (LATISSE) 0.03 %  ophthalmic solution; Place 1 application. into both eyes at bedtime. Place one drop on applicator and apply evenly along the skin of the upper eyelid at base of eyelashes once daily at bedtime; repeat procedure for second eye (use a clean applicator). ? ? ?A1C is to goal.  ?Continue on same medications.  ?Sent refills.  ?BP to goal.  ?On statin.  ?Vaccine up to date.  ? ?ADHD- refilled Vyvanse.  ? ?Mammogram to request.  ? ?Latisse for eyelashes.  ? ?Cologuard to send to [place] home.  ?

## 2021-05-29 ENCOUNTER — Telehealth: Payer: Self-pay | Admitting: Neurology

## 2021-05-29 ENCOUNTER — Other Ambulatory Visit: Payer: Self-pay

## 2021-05-29 MED ORDER — DEXCOM G7 RECEIVER DEVI
1.0000 | Freq: Every day | 3 refills | Status: DC
Start: 2021-05-29 — End: 2021-11-29

## 2021-05-29 MED ORDER — DEXCOM G7 SENSOR MISC
1.0000 | Freq: Every day | 3 refills | Status: DC
Start: 2021-05-29 — End: 2021-08-01

## 2021-05-29 NOTE — Telephone Encounter (Signed)
Patient wants Dexcom 7 not 6. Sent.  ?

## 2021-06-05 ENCOUNTER — Telehealth: Payer: Self-pay

## 2021-06-05 NOTE — Telephone Encounter (Signed)
Does it sound like stomach bug?  I can certainly send over a few tabs of Zofran.  Prescription pended.  If its not improving in the next 24 hours then there may be something else going on and we may need to get her in. ?

## 2021-06-05 NOTE — Telephone Encounter (Signed)
Melissa [name] called and left a message requesting anti-nausea medication. She is having nausea and vomiting.  ?

## 2021-06-05 NOTE — Telephone Encounter (Signed)
Left a message for a return call. Need to know which pharmacy.  ?

## 2021-06-28 ENCOUNTER — Telehealth: Payer: Self-pay | Admitting: Neurology

## 2021-06-28 NOTE — Telephone Encounter (Signed)
Patient's husband left vm concerned patient had a severe seizure or stroke. He states that her cognitive abilities have declined, this started Wednesday. She is taking but not making sense. She can't answer questions to husband like his birthday or how long they have been together when she normally knows those answers. She is having numbness all over body. He wanted Talibah Colasurdo to order an MRI.  ? ?I advised him she needs emergency care and get her to the hospital ASAP for evaluation. He expressed understanding and will keep [place] updated.  ? ?Draken Farrior - FYI.  ?

## 2021-07-03 ENCOUNTER — Other Ambulatory Visit: Payer: Self-pay | Admitting: Neurology

## 2021-07-03 MED ORDER — SCOPOLAMINE 1 MG/3DAYS TD PT72: 1.0000 | MEDICATED_PATCH | TRANSDERMAL | 0 refills | Status: DC

## 2021-07-03 NOTE — Telephone Encounter (Signed)
Patient's husband made aware.  ? ?To follow up from last message. Patient had sepsis from UTI causing confusion. She is better now and has been discharged from hospital.  ?

## 2021-07-03 NOTE — Telephone Encounter (Signed)
Patient's husband left vm stating they are leaving to go on a Cruise tomorrow and she is requesting Scopolamine patches. Okay to send? Pended.  ?

## 2021-08-01 ENCOUNTER — Telehealth: Payer: Self-pay | Admitting: Neurology

## 2021-08-01 MED ORDER — DEXCOM G7 SENSOR MISC: 1.0000 | Freq: Every day | 3 refills | Status: DC

## 2021-08-01 NOTE — Telephone Encounter (Signed)
Patient called for [facility]. RX sent.

## 2021-08-28 ENCOUNTER — Telehealth: Payer: BC Managed Care – PPO | Admitting: Physician Assistant

## 2021-08-30 ENCOUNTER — Other Ambulatory Visit: Payer: Self-pay

## 2021-08-30 DIAGNOSIS — Z760 Encounter for issue of repeat prescription: Secondary | ICD-10-CM

## 2021-08-30 DIAGNOSIS — F5101 Primary insomnia: Secondary | ICD-10-CM

## 2021-08-30 DIAGNOSIS — E1043 Type 1 diabetes mellitus with diabetic autonomic (poly)neuropathy: Secondary | ICD-10-CM

## 2021-08-30 MED ORDER — LEVEMIR FLEXTOUCH 100 UNIT/ML ~~LOC~~ SOPN: 68.0000 [IU] | PEN_INJECTOR | Freq: Two times a day (BID) | SUBCUTANEOUS | 0 refills | Status: DC

## 2021-09-02 MED ORDER — ZOLPIDEM TARTRATE ER 12.5 MG PO TBCR: EXTENDED_RELEASE_TABLET | ORAL | 1 refills | Status: DC

## 2021-09-03 ENCOUNTER — Telehealth: Payer: BC Managed Care – PPO | Admitting: Physician Assistant

## 2021-09-03 MED ORDER — ZOLPIDEM TARTRATE ER 12.5 MG PO TBCR: EXTENDED_RELEASE_TABLET | ORAL | 1 refills | Status: DC

## 2021-09-03 NOTE — Telephone Encounter (Signed)
Error yesterday when transmitting. Please resign RX.

## 2021-09-03 NOTE — Addendum Note (Signed)
Addended bySilvio Pate on: 09/03/2021 09:16 AM   Modules accepted: Orders

## 2021-09-04 ENCOUNTER — Telehealth (INDEPENDENT_AMBULATORY_CARE_PROVIDER_SITE_OTHER): Payer: Self-pay | Admitting: Physician Assistant

## 2021-09-04 NOTE — Progress Notes (Signed)
LM to reschedule at 3:11.

## 2021-09-13 ENCOUNTER — Telehealth (INDEPENDENT_AMBULATORY_CARE_PROVIDER_SITE_OTHER): Payer: Self-pay | Admitting: Physician Assistant

## 2021-09-13 ENCOUNTER — Other Ambulatory Visit: Payer: Self-pay | Admitting: Physician Assistant

## 2021-09-13 ENCOUNTER — Telehealth: Payer: Self-pay | Admitting: Physician Assistant

## 2021-09-13 DIAGNOSIS — Z760 Encounter for issue of repeat prescription: Secondary | ICD-10-CM

## 2021-09-13 DIAGNOSIS — F411 Generalized anxiety disorder: Secondary | ICD-10-CM

## 2021-09-13 DIAGNOSIS — Z91199 Patient's noncompliance with other medical treatment and regimen due to unspecified reason: Secondary | ICD-10-CM

## 2021-09-13 MED ORDER — DEXCOM G7 SENSOR MISC
1.0000 | Freq: Every day | 3 refills | Status: DC
Start: 2021-09-13 — End: 2021-09-17

## 2021-09-13 MED ORDER — ALPRAZOLAM 1 MG PO TABS: ORAL_TABLET | ORAL | 0 refills | Status: DC

## 2021-09-13 NOTE — Telephone Encounter (Signed)
Pt called.  She is requesting 90 day refills on her Xanax and Dexacom G7. Send scripts to [facility].

## 2021-09-13 NOTE — Progress Notes (Signed)
LM she did not log into mychart or answer phone call x2.

## 2021-09-17 ENCOUNTER — Other Ambulatory Visit: Payer: Self-pay

## 2021-09-17 DIAGNOSIS — Z760 Encounter for issue of repeat prescription: Secondary | ICD-10-CM

## 2021-09-17 DIAGNOSIS — F411 Generalized anxiety disorder: Secondary | ICD-10-CM

## 2021-09-17 MED ORDER — DEXCOM G7 SENSOR MISC: 3 refills | Status: DC

## 2021-09-17 MED ORDER — ALPRAZOLAM 1 MG PO TABS: ORAL_TABLET | ORAL | 0 refills | Status: DC

## 2021-09-17 NOTE — Telephone Encounter (Signed)
Refill sent to [facility]. She wants it to go to the local pharmacy.

## 2021-09-17 NOTE — Telephone Encounter (Signed)
I do not mind resending it but since the original was sent to the mail order 4 days ago then we need to call and make sure that it is been canceled and that its not already been shipped out.

## 2021-09-25 ENCOUNTER — Encounter: Payer: Self-pay | Admitting: Neurology

## 2021-10-07 ENCOUNTER — Ambulatory Visit: Payer: BC Managed Care – PPO | Admitting: Physician Assistant

## 2021-10-08 ENCOUNTER — Telehealth (INDEPENDENT_AMBULATORY_CARE_PROVIDER_SITE_OTHER): Payer: BC Managed Care – PPO | Admitting: Physician Assistant

## 2021-10-08 ENCOUNTER — Encounter: Payer: Self-pay | Admitting: Physician Assistant

## 2021-10-08 DIAGNOSIS — F5101 Primary insomnia: Secondary | ICD-10-CM | POA: Diagnosis not present

## 2021-10-08 DIAGNOSIS — E7800 Pure hypercholesterolemia, unspecified: Secondary | ICD-10-CM

## 2021-10-08 DIAGNOSIS — F411 Generalized anxiety disorder: Secondary | ICD-10-CM | POA: Diagnosis not present

## 2021-10-08 DIAGNOSIS — F3340 Major depressive disorder, recurrent, in remission, unspecified: Secondary | ICD-10-CM

## 2021-10-08 DIAGNOSIS — Z760 Encounter for issue of repeat prescription: Secondary | ICD-10-CM | POA: Diagnosis not present

## 2021-10-08 DIAGNOSIS — E1043 Type 1 diabetes mellitus with diabetic autonomic (poly)neuropathy: Secondary | ICD-10-CM

## 2021-10-08 DIAGNOSIS — F39 Unspecified mood [affective] disorder: Secondary | ICD-10-CM

## 2021-10-08 DIAGNOSIS — I10 Essential (primary) hypertension: Secondary | ICD-10-CM

## 2021-10-08 DIAGNOSIS — F909 Attention-deficit hyperactivity disorder, unspecified type: Secondary | ICD-10-CM

## 2021-10-08 NOTE — Progress Notes (Signed)
..Virtual Visit via Telephone Note  I connected with Keirra [place] on 10/08/21 at  1:00 PM EDT by telephone and verified that I am speaking with the correct person using two identifiers.  Location: Patient: home Provider: clinic  .[name] KitchenParticipating in visit:  Patient: Melissa [name] Provider: Tandy Gaw PA-C   I discussed the limitations, risks, security and privacy concerns of performing an evaluation and management service by telephone and the availability of in person appointments. I also discussed with the patient that there may be a patient responsible charge related to this service. The patient expressed understanding and agreed to proceed.   History of Present Illness: Pt is a 51 yo with T1DM with eye/oral/neuropathic/skin complications, HTN, HLD, ADHD, MDD, GAD who needs medications refilled.   She has recently had a few oral surgeries with 3 dental implants.  She is recovering from UTI as well.   She checks her sugar with dexcom and average sugars are 138.   Pt needs vyvanse for ADHD.   No CP, palpitations, headaches or vision changes.   .. 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/2012   Toe osteomyelitis, left (HCC) 05/31/2012   Adult ADHD 08/09/2012   Neuropathy 03/25/2013   Cataract of both eyes 09/13/2013   Mood disorder (HCC) 09/13/2013   GAD (generalized anxiety disorder) 09/13/2013   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 (HCC) 09/13/2013   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) 04/26/2014   Vitreous  hemorrhage of left eye (HCC) 04/26/2014   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) 04/26/2014   Right foot pain 02/12/2015   Poor dentition 02/12/2015   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   Left foot pain 02/13/2015   DDD (degenerative disc disease), lumbar 02/13/2015   Swelling of lower extremity 07/13/2015   Pure hypercholesterolemia 05/22/2016   BMI 31.0-31.9,adult 11/20/2016   Lower extremity edema 05/20/2017   Sinus pressure 08/24/2017   Primary insomnia 03/13/2019   Medication management 09/17/2020   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) 11/19/2020   Prosthetic and other implants, materials and neurological devices associated with adverse incidents 02/12/2021   Recurrent major depressive disorder, in remission (HCC) 05/28/2021   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   Erroneous encounter - disregard 09/04/2021   Resolved Ambulatory Problems    Diagnosis Date Noted   URI 07/03/2009   TRIGGER FINGER, LEFT THUMB 05/14/2009   FOOT SPRAIN, RIGHT 02/24/2010   FOOT INJURY, RIGHT 02/24/2010   ADHD 03/19/2010   Acute bronchitis 10/06/2010   Cough 10/06/2010   Wound, open, leg 12/25/2011   Gas gangrene of extremity (HCC) 02/05/2012   Cellulitis of leg, left 05/13/2012   Past Medical History:  Diagnosis Date   Depression    Diabetes mellitus 3-10   DM type 2 causing CKD stage 3 (HCC)    PTSD (post-traumatic stress disorder)     Observations/Objective: No acute distress   Assessment and Plan: [name] KitchenMarland KitchenDeanna was seen today for diabetes and follow-up.  Diagnoses and all orders for this visit:  Medication refill -  amitriptyline (ELAVIL) 10 MG tablet; TAKE 4 TABLETS AT BEDTIME AS NEEDED FOR INSOMNIA -     atorvastatin (LIPITOR) 80 MG tablet; Take 1 tablet (80 mg total) by mouth daily. -     buPROPion (WELLBUTRIN XL) 150 MG 24 hr tablet; Take 1 tablet (150 mg total) by mouth daily. -     buPROPion  (WELLBUTRIN XL) 300 MG 24 hr tablet; Take 1 tablet (300 mg total) by mouth daily. -     lamoTRIgine (LAMICTAL) 150 MG tablet; Take 1 tablet (150 mg total) by mouth daily. -     lisdexamfetamine (VYVANSE) 70 MG capsule; Take 1 capsule (70 mg total) by mouth every morning. -     lisinopril (ZESTRIL) 5 MG tablet; Take 1 tablet (5 mg total) by mouth daily.  Primary insomnia -     amitriptyline (ELAVIL) 10 MG tablet; TAKE 4 TABLETS AT BEDTIME AS NEEDED FOR INSOMNIA  Pure hypercholesterolemia -     atorvastatin (LIPITOR) 80 MG tablet; Take 1 tablet (80 mg total) by mouth daily.  GAD (generalized anxiety disorder) -     buPROPion (WELLBUTRIN XL) 150 MG 24 hr tablet; Take 1 tablet (150 mg total) by mouth daily. -     buPROPion (WELLBUTRIN XL) 300 MG 24 hr tablet; Take 1 tablet (300 mg total) by mouth daily.  Recurrent major depressive disorder, in remission (HCC) -     buPROPion (WELLBUTRIN XL) 150 MG 24 hr tablet; Take 1 tablet (150 mg total) by mouth daily. -     buPROPion (WELLBUTRIN XL) 300 MG 24 hr tablet; Take 1 tablet (300 mg total) by mouth daily.  Mood disorder (HCC) -     lamoTRIgine (LAMICTAL) 150 MG tablet; Take 1 tablet (150 mg total) by mouth daily.  Adult ADHD -     lisdexamfetamine (VYVANSE) 70 MG capsule; Take 1 capsule (70 mg total) by mouth every morning.  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  Essential hypertension, benign -     lisinopril (ZESTRIL) 5 MG tablet; Take 1 tablet (5 mg total) by mouth daily.   Medications refilled Follow up in 3 months.    Follow Up Instructions:    I discussed the assessment and treatment plan with the patient. The patient was provided an opportunity to ask questions and all were answered. The patient agreed with the plan and demonstrated an understanding of the instructions.   The patient was advised to call back or seek  an in-person evaluation if the symptoms worsen or if the condition fails to improve as anticipated.  Spent 25 minutes in non-face to face encounter with patient.   Tandy Gaw, PA-C

## 2021-10-09 MED ORDER — LISINOPRIL 5 MG PO TABS: 5.0000 mg | ORAL_TABLET | Freq: Every day | ORAL | 1 refills | Status: DC

## 2021-10-09 MED ORDER — LAMOTRIGINE 150 MG PO TABS: 150.0000 mg | ORAL_TABLET | Freq: Every day | ORAL | 4 refills | Status: DC

## 2021-10-09 MED ORDER — BUPROPION HCL ER (XL) 150 MG PO TB24: 150.0000 mg | ORAL_TABLET | Freq: Every day | ORAL | 1 refills | Status: DC

## 2021-10-09 MED ORDER — BUPROPION HCL ER (XL) 300 MG PO TB24: 300.0000 mg | ORAL_TABLET | Freq: Every day | ORAL | 1 refills | Status: DC

## 2021-10-09 MED ORDER — ATORVASTATIN CALCIUM 80 MG PO TABS: 80.0000 mg | ORAL_TABLET | Freq: Every day | ORAL | 3 refills | Status: DC

## 2021-10-09 MED ORDER — AMITRIPTYLINE HCL 10 MG PO TABS: ORAL_TABLET | ORAL | 0 refills | Status: DC

## 2021-10-09 MED ORDER — LISDEXAMFETAMINE DIMESYLATE 70 MG PO CAPS: 70.0000 mg | ORAL_CAPSULE | ORAL | 0 refills | Status: DC

## 2021-11-01 ENCOUNTER — Other Ambulatory Visit: Payer: Self-pay | Admitting: Physician Assistant

## 2021-11-01 DIAGNOSIS — Z760 Encounter for issue of repeat prescription: Secondary | ICD-10-CM

## 2021-11-01 DIAGNOSIS — E1043 Type 1 diabetes mellitus with diabetic autonomic (poly)neuropathy: Secondary | ICD-10-CM

## 2021-11-27 ENCOUNTER — Other Ambulatory Visit: Payer: Self-pay

## 2021-11-27 MED ORDER — DEXCOM G7 SENSOR MISC: 3 refills | Status: DC

## 2021-11-29 ENCOUNTER — Other Ambulatory Visit: Payer: Self-pay | Admitting: Physician Assistant

## 2021-11-29 ENCOUNTER — Telehealth: Payer: Self-pay | Admitting: Neurology

## 2021-11-29 DIAGNOSIS — Z760 Encounter for issue of repeat prescription: Secondary | ICD-10-CM

## 2021-11-29 DIAGNOSIS — E1043 Type 1 diabetes mellitus with diabetic autonomic (poly)neuropathy: Secondary | ICD-10-CM

## 2021-11-29 MED ORDER — LEVEMIR FLEXPEN 100 UNIT/ML ~~LOC~~ SOPN: PEN_INJECTOR | SUBCUTANEOUS | 0 refills | Status: DC

## 2021-11-29 MED ORDER — DEXCOM G7 RECEIVER DEVI: 1.0000 | Freq: Every day | 3 refills | Status: DC

## 2021-11-29 NOTE — Telephone Encounter (Signed)
Patient wanted Levemir sent as 90 day supply, it looks like sent that way but pharmacy states will need 45 ml for 90 day supply. Sent. [facility]

## 2021-12-02 NOTE — Telephone Encounter (Signed)
[facility] - " DEXCOM RECEIVER NOT COVERED UNTIL JANUARY UNLESS WE GET A PA".

## 2021-12-12 ENCOUNTER — Telehealth: Payer: Self-pay

## 2021-12-12 NOTE — Telephone Encounter (Signed)
Pa has been submitted, pt notifed via mychart   

## 2021-12-12 NOTE — Telephone Encounter (Addendum)
Initiated Prior authorization UJW:JXBJYN [place] Receiver device Via: Covermymeds Case/Key:[id number]  Status: n/a as of 12/12/21 Reason:No pa is required at this time,CaseId:[id number];Status:Cancelled;Explanation:Case not required.Provider indicated dosing, "Replace sensor every 10 days" does not exceed plan limits if billing or case was submitted for SENSORS. EPA case submitted for 9 RECEIVERS. Member last obtained RECEIVER on 05/29/2021. [facility] manufacturer instructions indicate a RECEIVER is used for ONE YEAR *(365 days)*. Provider answers do NOT indicate member is in need of replacement RECEIVER at this time. Case is not required at this time.; Notified Pt via: Mychart

## 2021-12-13 NOTE — Telephone Encounter (Signed)
No pa is required at this time:CaseId:[id number];Status:Cancelled;Explanation:Case not required.Provider indicated dosing, "Replace sensor every 10 days" does not exceed plan limits if billing or case was submitted for SENSORS. EPA case submitted for 9 RECEIVERS. Member last obtained RECEIVER on 05/29/2021. [facility] manufacturer instructions indicate a RECEIVER is used for ONE YEAR *(365 days)*. Provider answers do NOT indicate member is in need of replacement RECEIVER at this time. Case is not required at this time.;

## 2021-12-16 ENCOUNTER — Other Ambulatory Visit: Payer: Self-pay

## 2021-12-16 DIAGNOSIS — Z760 Encounter for issue of repeat prescription: Secondary | ICD-10-CM

## 2021-12-16 DIAGNOSIS — E103593 Type 1 diabetes mellitus with proliferative diabetic retinopathy without macular edema, bilateral: Secondary | ICD-10-CM

## 2021-12-16 DIAGNOSIS — F5101 Primary insomnia: Secondary | ICD-10-CM

## 2021-12-16 DIAGNOSIS — F39 Unspecified mood [affective] disorder: Secondary | ICD-10-CM

## 2021-12-16 MED ORDER — INSULIN LISPRO (1 UNIT DIAL) 100 UNIT/ML (KWIKPEN): PEN_INJECTOR | SUBCUTANEOUS | 3 refills | Status: DC

## 2021-12-16 MED ORDER — LAMOTRIGINE 150 MG PO TABS: 150.0000 mg | ORAL_TABLET | Freq: Every day | ORAL | 4 refills | Status: DC

## 2021-12-17 MED ORDER — ZOLPIDEM TARTRATE ER 12.5 MG PO TBCR: EXTENDED_RELEASE_TABLET | ORAL | 1 refills | Status: DC

## 2021-12-18 ENCOUNTER — Telehealth: Payer: Self-pay

## 2021-12-18 ENCOUNTER — Telehealth: Payer: Self-pay | Admitting: Physician Assistant

## 2021-12-18 ENCOUNTER — Other Ambulatory Visit: Payer: Self-pay | Admitting: Physician Assistant

## 2021-12-18 ENCOUNTER — Other Ambulatory Visit: Payer: Self-pay

## 2021-12-18 DIAGNOSIS — E1043 Type 1 diabetes mellitus with diabetic autonomic (poly)neuropathy: Secondary | ICD-10-CM

## 2021-12-18 DIAGNOSIS — E103593 Type 1 diabetes mellitus with proliferative diabetic retinopathy without macular edema, bilateral: Secondary | ICD-10-CM

## 2021-12-18 DIAGNOSIS — F909 Attention-deficit hyperactivity disorder, unspecified type: Secondary | ICD-10-CM

## 2021-12-18 DIAGNOSIS — F39 Unspecified mood [affective] disorder: Secondary | ICD-10-CM

## 2021-12-18 DIAGNOSIS — Z760 Encounter for issue of repeat prescription: Secondary | ICD-10-CM

## 2021-12-18 MED ORDER — INSULIN LISPRO (1 UNIT DIAL) 100 UNIT/ML (KWIKPEN): PEN_INJECTOR | SUBCUTANEOUS | 3 refills | Status: DC

## 2021-12-18 MED ORDER — LAMOTRIGINE 150 MG PO TABS: 150.0000 mg | ORAL_TABLET | Freq: Every day | ORAL | 4 refills | Status: DC

## 2021-12-18 NOTE — Telephone Encounter (Signed)
[name] called and states the Vyvanse needs a prior authorization.

## 2021-12-18 NOTE — Telephone Encounter (Signed)
Vyvanse last sent 10/09/2021 #[id number] with no refills. Last appt 10/08/2021

## 2021-12-18 NOTE — Telephone Encounter (Signed)
Patient stated was speaking with you about refill but that was wrong medication needing humalog ? She wasn't sure for the insulin she only has 1/2 a pen. Also Mamictal as well need she only has 1 pill left.

## 2021-12-18 NOTE — Telephone Encounter (Signed)
Pt called stating  she needed a pa for vyvanse, a pa was ran and one is not needed at this time.   Initiated Prior authorization IFB:[id number] 70MG  capsules Via: Covermymeds Case/Key:[id number] Status: N/A as of  12/18/21 Reason:Drug is covered by current benefit plan. No further PA activity needed Notified Pt via: Mychart

## 2021-12-19 NOTE — Telephone Encounter (Signed)
Called patient back, LVM to call with clarification of what this means and what she needs.

## 2021-12-30 ENCOUNTER — Other Ambulatory Visit: Payer: Self-pay | Admitting: Physician Assistant

## 2021-12-30 DIAGNOSIS — E1043 Type 1 diabetes mellitus with diabetic autonomic (poly)neuropathy: Secondary | ICD-10-CM

## 2021-12-30 DIAGNOSIS — Z760 Encounter for issue of repeat prescription: Secondary | ICD-10-CM

## 2022-01-13 ENCOUNTER — Ambulatory Visit: Payer: BC Managed Care – PPO | Admitting: Physician Assistant

## 2022-01-24 ENCOUNTER — Other Ambulatory Visit: Payer: Self-pay

## 2022-01-24 MED ORDER — DEXCOM G7 SENSOR MISC: 3 refills | Status: DC

## 2022-02-03 ENCOUNTER — Telehealth: Payer: Self-pay | Admitting: Neurology

## 2022-02-03 NOTE — Telephone Encounter (Signed)
Patient Melissa [name] wanting RX for Dexcom sensors. This was sent on 01/24/2022. She states the pharmacy told her they wouldn't fill these because it was too early. Was having skin issues with them, but has been using flonase on the skin first and that helps.   She will contact the pharmacy again and call us if needed. Let her know they could fill early but insurance will probably not cover them and we couldn't fix that issue.

## 2022-03-05 ENCOUNTER — Ambulatory Visit: Payer: BC Managed Care – PPO | Admitting: Physician Assistant

## 2022-03-12 ENCOUNTER — Ambulatory Visit: Payer: BC Managed Care – PPO | Admitting: Physician Assistant

## 2022-03-12 ENCOUNTER — Encounter: Payer: Self-pay | Admitting: Physician Assistant

## 2022-03-12 VITALS — BP 161/82 | HR 94 | Ht 64.0 in | Wt 172.0 lb

## 2022-03-12 DIAGNOSIS — E1043 Type 1 diabetes mellitus with diabetic autonomic (poly)neuropathy: Secondary | ICD-10-CM

## 2022-03-12 DIAGNOSIS — Z760 Encounter for issue of repeat prescription: Secondary | ICD-10-CM | POA: Diagnosis not present

## 2022-03-12 DIAGNOSIS — F39 Unspecified mood [affective] disorder: Secondary | ICD-10-CM

## 2022-03-12 DIAGNOSIS — E7800 Pure hypercholesterolemia, unspecified: Secondary | ICD-10-CM

## 2022-03-12 DIAGNOSIS — F411 Generalized anxiety disorder: Secondary | ICD-10-CM

## 2022-03-12 DIAGNOSIS — F5101 Primary insomnia: Secondary | ICD-10-CM | POA: Diagnosis not present

## 2022-03-12 DIAGNOSIS — I10 Essential (primary) hypertension: Secondary | ICD-10-CM | POA: Diagnosis not present

## 2022-03-12 DIAGNOSIS — E103593 Type 1 diabetes mellitus with proliferative diabetic retinopathy without macular edema, bilateral: Secondary | ICD-10-CM

## 2022-03-12 DIAGNOSIS — H02729 Madarosis of unspecified eye, unspecified eyelid and periocular area: Secondary | ICD-10-CM

## 2022-03-12 DIAGNOSIS — F909 Attention-deficit hyperactivity disorder, unspecified type: Secondary | ICD-10-CM

## 2022-03-12 DIAGNOSIS — F3340 Major depressive disorder, recurrent, in remission, unspecified: Secondary | ICD-10-CM

## 2022-03-12 DIAGNOSIS — B377 Candidal sepsis: Secondary | ICD-10-CM

## 2022-03-12 LAB — POCT GLYCOSYLATED HEMOGLOBIN (HGB A1C): Hemoglobin A1C: 7.3 % — AB (ref 4.0–5.6)

## 2022-03-12 MED ORDER — LISDEXAMFETAMINE DIMESYLATE 70 MG PO CAPS: 70.0000 mg | ORAL_CAPSULE | ORAL | 0 refills | Status: DC

## 2022-03-12 MED ORDER — BUPROPION HCL ER (XL) 300 MG PO TB24: 300.0000 mg | ORAL_TABLET | Freq: Every day | ORAL | 0 refills | Status: DC

## 2022-03-12 MED ORDER — AMITRIPTYLINE HCL 10 MG PO TABS: ORAL_TABLET | ORAL | 0 refills | Status: DC

## 2022-03-12 MED ORDER — BUPROPION HCL ER (XL) 150 MG PO TB24: 150.0000 mg | ORAL_TABLET | Freq: Every day | ORAL | 0 refills | Status: DC

## 2022-03-12 MED ORDER — ZOLPIDEM TARTRATE ER 12.5 MG PO TBCR: EXTENDED_RELEASE_TABLET | ORAL | 0 refills | Status: DC

## 2022-03-12 MED ORDER — LISINOPRIL 5 MG PO TABS: 5.0000 mg | ORAL_TABLET | Freq: Every day | ORAL | 0 refills | Status: DC

## 2022-03-12 MED ORDER — LAMOTRIGINE 150 MG PO TABS: 150.0000 mg | ORAL_TABLET | Freq: Every day | ORAL | 0 refills | Status: DC

## 2022-03-12 MED ORDER — ATORVASTATIN CALCIUM 80 MG PO TABS: 80.0000 mg | ORAL_TABLET | Freq: Every day | ORAL | 0 refills | Status: DC

## 2022-03-12 NOTE — Patient Instructions (Signed)
Innocence [email or web address]
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
## 2022-03-14 MED ORDER — INSULIN LISPRO (1 UNIT DIAL) 100 UNIT/ML (KWIKPEN): PEN_INJECTOR | SUBCUTANEOUS | 3 refills | Status: DC

## 2022-03-14 MED ORDER — GLUCAGON 1 MG/0.2ML ~~LOC~~ SOLN: 1.0000 mg | SUBCUTANEOUS | 1 refills | Status: DC | PRN

## 2022-03-14 MED ORDER — BUPROPION HCL ER (XL) 150 MG PO TB24: 150.0000 mg | ORAL_TABLET | Freq: Every day | ORAL | 3 refills | Status: DC

## 2022-03-14 MED ORDER — ZOLPIDEM TARTRATE ER 12.5 MG PO TBCR: EXTENDED_RELEASE_TABLET | ORAL | 1 refills | Status: DC

## 2022-03-14 MED ORDER — AMITRIPTYLINE HCL 10 MG PO TABS: ORAL_TABLET | ORAL | 3 refills | Status: DC

## 2022-03-14 MED ORDER — LISINOPRIL 5 MG PO TABS: 5.0000 mg | ORAL_TABLET | Freq: Every day | ORAL | 3 refills | Status: DC

## 2022-03-14 MED ORDER — ATORVASTATIN CALCIUM 80 MG PO TABS: 80.0000 mg | ORAL_TABLET | Freq: Every day | ORAL | 3 refills | Status: DC

## 2022-03-14 MED ORDER — TRAMADOL HCL 50 MG PO TABS: ORAL_TABLET | ORAL | 0 refills | Status: DC

## 2022-03-14 MED ORDER — FLUCONAZOLE 150 MG PO TABS: 150.0000 mg | ORAL_TABLET | Freq: Every day | ORAL | 1 refills | Status: DC

## 2022-03-14 MED ORDER — DEXCOM G7 RECEIVER DEVI: 3 refills | Status: DC

## 2022-03-14 MED ORDER — LEVEMIR FLEXPEN 100 UNIT/ML ~~LOC~~ SOPN: PEN_INJECTOR | SUBCUTANEOUS | 1 refills | Status: DC

## 2022-03-14 MED ORDER — LISDEXAMFETAMINE DIMESYLATE 70 MG PO CAPS: 70.0000 mg | ORAL_CAPSULE | ORAL | 0 refills | Status: DC

## 2022-03-14 MED ORDER — LAMOTRIGINE 150 MG PO TABS: 150.0000 mg | ORAL_TABLET | Freq: Every day | ORAL | 1 refills | Status: DC

## 2022-03-14 MED ORDER — DEXCOM G6 TRANSMITTER MISC: 1.0000 [IU] | 3 refills | Status: DC

## 2022-03-14 MED ORDER — BIMATOPROST 0.03 % EX SOLN: 1.0000 "application " | Freq: Every day | CUTANEOUS | 4 refills | Status: DC

## 2022-03-14 MED ORDER — BUPROPION HCL ER (XL) 300 MG PO TB24: 300.0000 mg | ORAL_TABLET | Freq: Every day | ORAL | 3 refills | Status: DC

## 2022-03-17 ENCOUNTER — Other Ambulatory Visit: Payer: Self-pay

## 2022-03-17 DIAGNOSIS — Z760 Encounter for issue of repeat prescription: Secondary | ICD-10-CM

## 2022-03-17 DIAGNOSIS — E103593 Type 1 diabetes mellitus with proliferative diabetic retinopathy without macular edema, bilateral: Secondary | ICD-10-CM

## 2022-03-17 DIAGNOSIS — E1043 Type 1 diabetes mellitus with diabetic autonomic (poly)neuropathy: Secondary | ICD-10-CM

## 2022-03-17 MED ORDER — LEVEMIR FLEXPEN 100 UNIT/ML ~~LOC~~ SOPN: PEN_INJECTOR | SUBCUTANEOUS | 1 refills | Status: DC

## 2022-03-17 MED ORDER — INSULIN LISPRO (1 UNIT DIAL) 100 UNIT/ML (KWIKPEN): PEN_INJECTOR | SUBCUTANEOUS | 3 refills | Status: DC

## 2022-03-18 ENCOUNTER — Other Ambulatory Visit: Payer: Self-pay | Admitting: Physician Assistant

## 2022-03-18 DIAGNOSIS — E1043 Type 1 diabetes mellitus with diabetic autonomic (poly)neuropathy: Secondary | ICD-10-CM

## 2022-03-18 DIAGNOSIS — Z760 Encounter for issue of repeat prescription: Secondary | ICD-10-CM

## 2022-03-18 DIAGNOSIS — I10 Essential (primary) hypertension: Secondary | ICD-10-CM

## 2022-03-18 NOTE — Telephone Encounter (Signed)
[place]

## 2022-03-25 ENCOUNTER — Encounter: Payer: Self-pay | Admitting: Physician Assistant

## 2022-03-30 ENCOUNTER — Other Ambulatory Visit: Payer: Self-pay | Admitting: Physician Assistant

## 2022-03-30 DIAGNOSIS — F411 Generalized anxiety disorder: Secondary | ICD-10-CM

## 2022-03-30 DIAGNOSIS — F5101 Primary insomnia: Secondary | ICD-10-CM

## 2022-03-30 DIAGNOSIS — F39 Unspecified mood [affective] disorder: Secondary | ICD-10-CM

## 2022-03-30 DIAGNOSIS — F3340 Major depressive disorder, recurrent, in remission, unspecified: Secondary | ICD-10-CM

## 2022-03-30 DIAGNOSIS — E1043 Type 1 diabetes mellitus with diabetic autonomic (poly)neuropathy: Secondary | ICD-10-CM

## 2022-03-30 DIAGNOSIS — Z760 Encounter for issue of repeat prescription: Secondary | ICD-10-CM

## 2022-04-08 ENCOUNTER — Other Ambulatory Visit: Payer: Self-pay | Admitting: Neurology

## 2022-04-08 DIAGNOSIS — F909 Attention-deficit hyperactivity disorder, unspecified type: Secondary | ICD-10-CM

## 2022-04-08 DIAGNOSIS — F411 Generalized anxiety disorder: Secondary | ICD-10-CM

## 2022-04-08 DIAGNOSIS — Z760 Encounter for issue of repeat prescription: Secondary | ICD-10-CM

## 2022-04-08 DIAGNOSIS — F5101 Primary insomnia: Secondary | ICD-10-CM

## 2022-04-08 NOTE — Telephone Encounter (Signed)
Patient's husband called. [facility] was not able to fill RXs. They are asking for [facility]

## 2022-04-09 ENCOUNTER — Other Ambulatory Visit: Payer: Self-pay | Admitting: Physician Assistant

## 2022-04-09 DIAGNOSIS — Z760 Encounter for issue of repeat prescription: Secondary | ICD-10-CM

## 2022-04-09 DIAGNOSIS — E1043 Type 1 diabetes mellitus with diabetic autonomic (poly)neuropathy: Secondary | ICD-10-CM

## 2022-04-09 DIAGNOSIS — I10 Essential (primary) hypertension: Secondary | ICD-10-CM

## 2022-04-09 MED ORDER — ALPRAZOLAM 1 MG PO TABS: ORAL_TABLET | ORAL | 0 refills | Status: DC

## 2022-04-09 MED ORDER — ZOLPIDEM TARTRATE ER 12.5 MG PO TBCR: EXTENDED_RELEASE_TABLET | ORAL | 0 refills | Status: DC

## 2022-04-09 MED ORDER — LISDEXAMFETAMINE DIMESYLATE 70 MG PO CAPS: 70.0000 mg | ORAL_CAPSULE | ORAL | 0 refills | Status: DC

## 2022-04-10 ENCOUNTER — Telehealth: Payer: Self-pay

## 2022-04-10 NOTE — Telephone Encounter (Signed)
Patient also left a  vm on my line about this - but also asked if we could send in HCTZ that patient was on in the past due to some swelling in leg. It looks like the last time this was prescribed was in 2021.

## 2022-04-10 NOTE — Telephone Encounter (Signed)
[name] called and left a message requesting to switch to Adderall instead of Vyvanse due to backorder issues.

## 2022-04-11 MED ORDER — HYDROCHLOROTHIAZIDE 12.5 MG PO CAPS: 25.0000 mg | ORAL_CAPSULE | Freq: Every day | ORAL | 1 refills | Status: DC

## 2022-04-11 MED ORDER — AMPHETAMINE-DEXTROAMPHET ER 30 MG PO CP24: 30.0000 mg | ORAL_CAPSULE | Freq: Every day | ORAL | 0 refills | Status: DC

## 2022-04-11 NOTE — Telephone Encounter (Signed)
Patient no longer uses [facility], [facility]

## 2022-04-11 NOTE — Telephone Encounter (Signed)
Sent to [facility].

## 2022-04-11 NOTE — Addendum Note (Signed)
Addended by: Donella Stade on: 04/11/2022 03:56 PM   Modules accepted: Orders

## 2022-04-11 NOTE — Addendum Note (Signed)
Addended byAnnamaria Helling on: 04/11/2022 01:21 PM   Modules accepted: Orders

## 2022-04-22 ENCOUNTER — Other Ambulatory Visit: Payer: Self-pay | Admitting: Neurology

## 2022-04-22 MED ORDER — DEXCOM G7 SENSOR MISC: 3 refills | Status: DC

## 2022-04-22 NOTE — Progress Notes (Signed)
Patient's husband called and LVM wanting RX for Dexcom sensors sent to [facility] will not transfer. RX sent.

## 2022-04-23 ENCOUNTER — Telehealth: Payer: Self-pay | Admitting: Physician Assistant

## 2022-04-23 MED ORDER — DEXCOM G7 SENSOR MISC: 0 refills | Status: DC

## 2022-04-23 NOTE — Telephone Encounter (Signed)
Pt called stating the pharmacy was unable to give her a refill of Continuous Blood Gluc Sensor ([place]) [place] [id number] until 05/24/2022. Pharmacy states she had too many refills. Pt is asking for a 30 day supply.

## 2022-04-23 NOTE — Telephone Encounter (Signed)
30 day supply sent

## 2022-05-01 ENCOUNTER — Other Ambulatory Visit: Payer: Self-pay | Admitting: Pharmacist

## 2022-05-01 ENCOUNTER — Telehealth: Payer: Self-pay | Admitting: Neurology

## 2022-05-01 DIAGNOSIS — Y752 Prosthetic and other implants, materials and neurological devices associated with adverse incidents: Secondary | ICD-10-CM

## 2022-05-01 DIAGNOSIS — Z89512 Acquired absence of left leg below knee: Secondary | ICD-10-CM

## 2022-05-01 DIAGNOSIS — E103593 Type 1 diabetes mellitus with proliferative diabetic retinopathy without macular edema, bilateral: Secondary | ICD-10-CM

## 2022-05-01 NOTE — Progress Notes (Signed)
Patient appearing on report for levemir usage in setting of upcoming levemir discontinuation.  Outreached patient to discuss glucose control and medication management as well as offer support during transition to alternative insulin. Left voicemail for patient to return my call at their convenience.  Larinda Buttery, PharmD Clinical Pharmacist [facility] [phone]

## 2022-05-01 NOTE — Telephone Encounter (Signed)
Patient's husband called asking for a new prosthetic RX to be faxed to [facility] [phone]). He states he leg has shrunk and current prosthesis no longer fits. Please advise if okay to write.

## 2022-05-05 MED ORDER — AMBULATORY NON FORMULARY MEDICATION: 0 refills | Status: DC

## 2022-05-05 NOTE — Telephone Encounter (Signed)
Prosthesis ordered and faxed to [facility] at [phone] with confirmation received.

## 2022-05-12 NOTE — Telephone Encounter (Signed)
Patient's husband left another message about prosthetic. Called back. LVM letting them know this was sent on 05/05/2022 and to contact company directly.

## 2022-06-11 ENCOUNTER — Telehealth: Payer: BC Managed Care – PPO | Admitting: Physician Assistant

## 2022-06-16 ENCOUNTER — Telehealth: Payer: BC Managed Care – PPO | Admitting: Physician Assistant

## 2022-06-20 NOTE — Addendum Note (Signed)
Addended byJomarie Longs on: 06/20/2022 12:50 PM   Modules accepted: Orders

## 2022-06-23 ENCOUNTER — Telehealth: Payer: BC Managed Care – PPO | Admitting: Physician Assistant

## 2022-06-24 ENCOUNTER — Telehealth: Payer: Self-pay | Admitting: Pharmacist

## 2022-06-24 NOTE — Progress Notes (Signed)
Attempted to contact patient in response to referral placed by primary provider for medication management. Initially outreached patient in response to levemir report, in the setting of upcoming levemir discontinuation by manufacturer - alternative medication will be needed when product availability discontinues at local pharmacies.  Patient interrupted my introduction that she is not interested and hung up.  Will notify PCP and close out referral.  Lynnda Shields, PharmD, BCPS Clinical Pharmacist [facility]

## 2022-06-25 ENCOUNTER — Telehealth: Payer: BC Managed Care – PPO | Admitting: Physician Assistant

## 2022-06-27 ENCOUNTER — Encounter: Payer: Self-pay | Admitting: Physician Assistant

## 2022-06-27 ENCOUNTER — Telehealth (INDEPENDENT_AMBULATORY_CARE_PROVIDER_SITE_OTHER): Payer: BC Managed Care – PPO | Admitting: Physician Assistant

## 2022-06-27 ENCOUNTER — Other Ambulatory Visit: Payer: Self-pay | Admitting: Physician Assistant

## 2022-06-27 VITALS — BP 130/84 | HR 98 | Wt 168.0 lb

## 2022-06-27 DIAGNOSIS — H02729 Madarosis of unspecified eye, unspecified eyelid and periocular area: Secondary | ICD-10-CM

## 2022-06-27 DIAGNOSIS — E7800 Pure hypercholesterolemia, unspecified: Secondary | ICD-10-CM

## 2022-06-27 DIAGNOSIS — F5101 Primary insomnia: Secondary | ICD-10-CM

## 2022-06-27 DIAGNOSIS — E1043 Type 1 diabetes mellitus with diabetic autonomic (poly)neuropathy: Secondary | ICD-10-CM | POA: Diagnosis not present

## 2022-06-27 DIAGNOSIS — F411 Generalized anxiety disorder: Secondary | ICD-10-CM

## 2022-06-27 DIAGNOSIS — F3340 Major depressive disorder, recurrent, in remission, unspecified: Secondary | ICD-10-CM

## 2022-06-27 DIAGNOSIS — D508 Other iron deficiency anemias: Secondary | ICD-10-CM | POA: Diagnosis not present

## 2022-06-27 DIAGNOSIS — I10 Essential (primary) hypertension: Secondary | ICD-10-CM

## 2022-06-27 DIAGNOSIS — E103593 Type 1 diabetes mellitus with proliferative diabetic retinopathy without macular edema, bilateral: Secondary | ICD-10-CM

## 2022-06-27 DIAGNOSIS — Z760 Encounter for issue of repeat prescription: Secondary | ICD-10-CM

## 2022-06-27 DIAGNOSIS — F909 Attention-deficit hyperactivity disorder, unspecified type: Secondary | ICD-10-CM

## 2022-06-27 MED ORDER — TRAMADOL HCL 50 MG PO TABS
ORAL_TABLET | ORAL | 0 refills | Status: DC
Start: 2022-06-27 — End: 2022-11-18

## 2022-06-27 MED ORDER — BUPROPION HCL ER (XL) 150 MG PO TB24
150.0000 mg | ORAL_TABLET | Freq: Every day | ORAL | 1 refills | Status: DC
Start: 2022-06-27 — End: 2022-12-30

## 2022-06-27 MED ORDER — ZOLPIDEM TARTRATE ER 12.5 MG PO TBCR
EXTENDED_RELEASE_TABLET | ORAL | 1 refills | Status: DC
Start: 2022-06-27 — End: 2022-08-08

## 2022-06-27 MED ORDER — ATORVASTATIN CALCIUM 80 MG PO TABS: 80.0000 mg | ORAL_TABLET | Freq: Every day | ORAL | 3 refills | Status: DC

## 2022-06-27 MED ORDER — FERROUS SULFATE 325 (65 FE) MG PO TBEC: 325.0000 mg | DELAYED_RELEASE_TABLET | Freq: Every day | ORAL | 3 refills | Status: DC

## 2022-06-27 MED ORDER — AMPHETAMINE-DEXTROAMPHET ER 30 MG PO CP24: 30.0000 mg | ORAL_CAPSULE | Freq: Every day | ORAL | 0 refills | Status: DC

## 2022-06-27 MED ORDER — ALPRAZOLAM 1 MG PO TABS: ORAL_TABLET | ORAL | 0 refills | Status: DC

## 2022-06-27 MED ORDER — GLUCAGON 1 MG/0.2ML ~~LOC~~ SOLN: 1.0000 mg | SUBCUTANEOUS | 1 refills | Status: DC | PRN

## 2022-06-27 MED ORDER — BUPROPION HCL ER (XL) 300 MG PO TB24: 300.0000 mg | ORAL_TABLET | Freq: Every day | ORAL | 1 refills | Status: DC

## 2022-06-27 MED ORDER — LISINOPRIL 5 MG PO TABS: 5.0000 mg | ORAL_TABLET | Freq: Every day | ORAL | 1 refills | Status: DC

## 2022-06-27 MED ORDER — BIMATOPROST 0.03 % EX SOLN: 1.0000 "application " | Freq: Every day | CUTANEOUS | 4 refills | Status: DC

## 2022-06-27 NOTE — Progress Notes (Signed)
..Virtual Visit via Video Note  I connected with Melissa [name] [place] on 06/27/22 at 10:50 AM EDT by a video enabled telemedicine application and verified that I am speaking with the correct person using two identifiers.  Location: Patient: home Provider: clinic  .[name] KitchenParticipating in visit:  Patient: Melissa [name] Provider: Tandy Gaw PA-C   I discussed the limitations of evaluation and management by telemedicine and the availability of in person appointments. The patient expressed understanding and agreed to proceed.  History of Present Illness: Pt is a 52 yo female who is a T1DM, ADHD, insomnia, MDD, GAD who needs medication refills.   Pt has dexcom and managing her sugars sliding scale with insulin. Last A1C was 6.7 per patient. No CP, palpitations. She did get a new prostheic leg and doing well.   No concerns. Needs refills sent to new pharmacy.    .. 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/2012   Toe osteomyelitis, left (HCC) 05/31/2012   Adult ADHD 08/09/2012   Neuropathy 03/25/2013   Cataract of both eyes 09/13/2013   Mood disorder (HCC) 09/13/2013   GAD (generalized anxiety disorder) 09/13/2013   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 (HCC) 09/13/2013   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) 04/26/2014   Vitreous hemorrhage of left eye (HCC) 04/26/2014   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) 04/26/2014   Right foot pain 02/12/2015   Poor dentition 02/12/2015   Recurrent  cellulitis of lower leg 02/12/2015   Left foot pain 02/13/2015   DDD (degenerative disc disease), lumbar 02/13/2015   Swelling of lower extremity 07/13/2015   Pure hypercholesterolemia 05/22/2016   BMI 31.0-31.9,adult 11/20/2016   Lower extremity edema 05/20/2017   Sinus pressure 08/24/2017   Primary insomnia 03/13/2019   Medication management 09/17/2020   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) 11/19/2020   Prosthetic and other implants, materials and neurological devices associated with adverse incidents 02/12/2021   Recurrent major depressive disorder, in remission (HCC) 05/28/2021   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   Erroneous encounter - disregard 09/04/2021   Resolved Ambulatory Problems    Diagnosis Date Noted   URI 07/03/2009   TRIGGER FINGER, LEFT THUMB 05/14/2009   FOOT SPRAIN, RIGHT 02/24/2010   FOOT INJURY, RIGHT 02/24/2010   ADHD 03/19/2010   Acute bronchitis 10/06/2010   Cough 10/06/2010   Wound, open, leg 12/25/2011   Gas gangrene of extremity (HCC) 02/05/2012   Cellulitis of leg, left 05/13/2012   Past Medical History:  Diagnosis Date   Depression    Diabetes mellitus 3-10   DM type 2 causing CKD stage 3 (HCC)    PTSD (post-traumatic stress disorder)     Observations/Objective: No acute distress Normal breathing Normal mood and appearance  .[name] Today's Vitals   06/27/22 1014  BP: 130/84  Pulse: 98  Weight: 168 lb (76.2 kg)   Body mass index is 28.84 kg/m.    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 -     ALPRAZolam (XANAX) 1 MG tablet; TAKE 1 TABLET BY MOUTH 3 TIMES A DAY -     lisinopril (ZESTRIL) 5 MG tablet; Take 1 tablet (5 mg total) by mouth daily.  Iron deficiency anemia secondary to inadequate dietary iron intake -     ferrous sulfate 325 (65 FE) MG  EC tablet; Take 1 tablet (325 mg total) by mouth daily with breakfast. -     ALPRAZolam (XANAX) 1 MG tablet; TAKE 1 TABLET BY MOUTH 3 TIMES A DAY  Pure hypercholesterolemia -     atorvastatin (LIPITOR) 80 MG tablet; Take 1 tablet (80 mg total) by mouth daily. -     ALPRAZolam (XANAX) 1 MG tablet; TAKE 1 TABLET BY MOUTH 3 TIMES A DAY  Medication refill -     atorvastatin (LIPITOR) 80 MG tablet; Take 1 tablet (80 mg total) by mouth daily. -     buPROPion (WELLBUTRIN XL) 150 MG 24 hr tablet; Take 1 tablet (150 mg total) by mouth daily. -     buPROPion (WELLBUTRIN XL) 300 MG 24 hr tablet; Take 1 tablet (300 mg total) by mouth daily. -     traMADol (ULTRAM) 50 MG tablet; 1-2 tabs by mouth Q8 hours, maximum 6 tabs per day. -     ALPRAZolam (XANAX) 1 MG tablet; TAKE 1 TABLET BY MOUTH 3 TIMES A DAY -     zolpidem (AMBIEN CR) 12.5 MG CR tablet; TAKE 1 TABLET AT BEDTIME -     lisinopril (ZESTRIL) 5 MG tablet; Take 1 tablet (5 mg total) by mouth daily.  Hypotrichosis of eyelid -     bimatoprost (LATISSE) 0.03 % ophthalmic solution; Place 1 application  into both eyes at bedtime. Place one drop on applicator and apply evenly along the skin of the upper eyelid at base of eyelashes once daily at bedtime; repeat procedure for second eye (use a clean applicator). -     ALPRAZolam (XANAX) 1 MG tablet; TAKE 1 TABLET BY MOUTH 3 TIMES A DAY  GAD (generalized anxiety disorder) -     buPROPion (WELLBUTRIN XL) 150 MG 24 hr tablet; Take 1 tablet (150 mg total) by mouth daily. -     buPROPion (WELLBUTRIN XL) 300 MG 24 hr tablet; Take 1 tablet (300 mg total) by mouth daily. -     ALPRAZolam (XANAX) 1 MG tablet; TAKE 1 TABLET BY MOUTH 3 TIMES A DAY  Recurrent major depressive disorder, in remission (HCC) -     buPROPion (WELLBUTRIN XL) 150 MG 24 hr tablet; Take 1 tablet (150 mg total) by mouth daily. -     buPROPion (WELLBUTRIN XL) 300 MG 24 hr tablet; Take 1 tablet (300 mg total) by mouth daily. 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 -     ALPRAZolam (XANAX) 1 MG tablet; TAKE 1 TABLET BY MOUTH 3 TIMES A DAY  Primary insomnia -     ALPRAZolam (XANAX) 1 MG tablet; TAKE 1 TABLET BY MOUTH 3 TIMES A DAY -     zolpidem (AMBIEN CR) 12.5 MG CR tablet; TAKE 1 TABLET AT BEDTIME  Essential hypertension, benign -  ALPRAZolam (XANAX) 1 MG tablet; TAKE 1 TABLET BY MOUTH 3 TIMES A DAY -     lisinopril (ZESTRIL) 5 MG tablet; Take 1 tablet (5 mg total) by mouth daily.  Adult ADHD -     amphetamine-dextroamphetamine (ADDERALL XR) 30 MG 24 hr capsule; Take 1 capsule (30 mg total) by mouth daily.   Pt has done some of health maintaince and has some scheduled. Strongly encouraged for her to keep copy of results so that we can abstract.   Vitals look good A1C per reported to goal Refills sent  Follow up in 3 months.     Follow Up Instructions:    I discussed the assessment and treatment plan with the patient. The patient was provided an opportunity to ask questions and all were answered. The patient agreed with the plan and demonstrated an understanding of the instructions.   The patient was advised to call back or seek an in-person evaluation if the symptoms worsen or if the condition fails to improve as anticipated.   Tandy Gaw, PA-C

## 2022-06-28 ENCOUNTER — Other Ambulatory Visit: Payer: Self-pay | Admitting: Physician Assistant

## 2022-06-28 DIAGNOSIS — Z760 Encounter for issue of repeat prescription: Secondary | ICD-10-CM

## 2022-06-28 DIAGNOSIS — E1043 Type 1 diabetes mellitus with diabetic autonomic (poly)neuropathy: Secondary | ICD-10-CM

## 2022-06-29 ENCOUNTER — Other Ambulatory Visit: Payer: Self-pay | Admitting: Physician Assistant

## 2022-06-29 DIAGNOSIS — E103593 Type 1 diabetes mellitus with proliferative diabetic retinopathy without macular edema, bilateral: Secondary | ICD-10-CM

## 2022-06-29 DIAGNOSIS — E1043 Type 1 diabetes mellitus with diabetic autonomic (poly)neuropathy: Secondary | ICD-10-CM

## 2022-07-04 NOTE — Telephone Encounter (Signed)
Left message for a return call

## 2022-07-08 ENCOUNTER — Encounter: Payer: Self-pay | Admitting: Physician Assistant

## 2022-07-08 ENCOUNTER — Other Ambulatory Visit: Payer: Self-pay | Admitting: Physician Assistant

## 2022-07-08 DIAGNOSIS — H02729 Madarosis of unspecified eye, unspecified eyelid and periocular area: Secondary | ICD-10-CM

## 2022-07-08 DIAGNOSIS — E103593 Type 1 diabetes mellitus with proliferative diabetic retinopathy without macular edema, bilateral: Secondary | ICD-10-CM

## 2022-07-08 DIAGNOSIS — F411 Generalized anxiety disorder: Secondary | ICD-10-CM

## 2022-07-08 DIAGNOSIS — F3340 Major depressive disorder, recurrent, in remission, unspecified: Secondary | ICD-10-CM

## 2022-07-08 DIAGNOSIS — Z760 Encounter for issue of repeat prescription: Secondary | ICD-10-CM

## 2022-07-08 DIAGNOSIS — F909 Attention-deficit hyperactivity disorder, unspecified type: Secondary | ICD-10-CM

## 2022-07-08 DIAGNOSIS — E1043 Type 1 diabetes mellitus with diabetic autonomic (poly)neuropathy: Secondary | ICD-10-CM

## 2022-07-08 DIAGNOSIS — F5101 Primary insomnia: Secondary | ICD-10-CM

## 2022-07-08 DIAGNOSIS — B377 Candidal sepsis: Secondary | ICD-10-CM

## 2022-07-08 DIAGNOSIS — I10 Essential (primary) hypertension: Secondary | ICD-10-CM

## 2022-07-08 DIAGNOSIS — D508 Other iron deficiency anemias: Secondary | ICD-10-CM

## 2022-07-08 DIAGNOSIS — E7800 Pure hypercholesterolemia, unspecified: Secondary | ICD-10-CM

## 2022-07-09 MED ORDER — DEXCOM G7 SENSOR MISC: 0 refills | Status: DC

## 2022-07-11 MED ORDER — DAPSONE 5 % EX GEL: 1.0000 | Freq: Two times a day (BID) | CUTANEOUS | 1 refills | Status: DC

## 2022-07-11 MED ORDER — FLUCONAZOLE 150 MG PO TABS
150.0000 mg | ORAL_TABLET | Freq: Every day | ORAL | 1 refills | Status: DC
Start: 2022-07-11 — End: 2022-11-18

## 2022-07-11 MED ORDER — BIMATOPROST 0.03 % EX SOLN
1.0000 | Freq: Every day | CUTANEOUS | 4 refills | Status: DC
Start: 2022-07-11 — End: 2022-08-26

## 2022-07-15 ENCOUNTER — Telehealth: Payer: Self-pay | Admitting: Physician Assistant

## 2022-07-15 NOTE — Telephone Encounter (Signed)
Rep called from [facility] about a denial for Bimatoprost drops .03 MG

## 2022-07-16 ENCOUNTER — Telehealth: Payer: Self-pay

## 2022-07-16 NOTE — Telephone Encounter (Signed)
Initiated Prior authorization ZOX:[id number] 0.03% external solution Via: Covermymeds Case/Key:[id number] Status: denied  as of 07/16/22 Reason:We have recently received a request from your physician regarding the drug(s) Bimatoprost 0.03  % DROP W/APP. This request was carefully reviewed by a Medical Director or for self-funded  groups, a clinical pharmacist who determined that the prescription requested does not meet the  criteria for coverage that has been established and approved by the Pharmacy and Therapeutics  Committee for the following reason(s): You asked for BIMATOPROST 0.03 % to treat hypotrichosis of the eyelid (a condition causing  little to no eyelash growth). Our policy for BIMATOPROST 0.03 % (Pharmacy-09) defines a  cosmetic drug as a drug that is used to improve your appearance and/or self-esteem. Cosmetic  drugs are considered not medically necessary. This request is not approved because you are  using BIMATOPROST 0.03 % to treat hypotrichosis of the eyelid which is a cosmetic use. Our decision means that we will not pay for the prescription you requested. The final decision  regarding whether or not you receive this prescription should be made by you and your  physician. A copy of the review criteria upon which our decision was based is available upon  written request to our Pharmacy Management Department at the above address or by calling [phone].  Notified Pt via: Mychart

## 2022-07-17 ENCOUNTER — Telehealth: Payer: Self-pay | Admitting: Physician Assistant

## 2022-07-17 DIAGNOSIS — F909 Attention-deficit hyperactivity disorder, unspecified type: Secondary | ICD-10-CM

## 2022-07-17 NOTE — Telephone Encounter (Signed)
Patient called about she stated she wanted to stay on Adderall XR 30 mg until [employer] is back in [employer]

## 2022-08-06 NOTE — Telephone Encounter (Addendum)
[facility] [place] - zolpidem 12.5 mg is not available. Requesting an alternative rx. Please advise. Thanks.

## 2022-08-08 ENCOUNTER — Telehealth: Payer: Self-pay | Admitting: Physician Assistant

## 2022-08-08 MED ORDER — ZOLPIDEM TARTRATE 10 MG PO TABS: 10.0000 mg | ORAL_TABLET | Freq: Every evening | ORAL | 0 refills | Status: DC | PRN

## 2022-08-08 NOTE — Telephone Encounter (Signed)
Attempted call to patient to let her know prescription had been sent. Left a voice mail message requesting a return call.

## 2022-08-08 NOTE — Telephone Encounter (Signed)
Patient's husband called in regards to Ambien cr 12.5 mg [facility] [address] [place]  [phone]

## 2022-08-25 ENCOUNTER — Telehealth: Payer: Self-pay | Admitting: Physician Assistant

## 2022-08-25 NOTE — Telephone Encounter (Signed)
Patient called requesting a refill of insulin detemir (LEVEMIR FLEXPEN) 100 UNIT/ML FlexPen [[id number]] and insulin lispro (HUMALOG KWIKPEN) 100 UNIT/ML KwikPen [[id number]]   Patient is requesting a 90 day supply.  Pharmacy ;   [id number] [place], [place] - [address]

## 2022-08-26 MED ORDER — INSULIN LISPRO (1 UNIT DIAL) 100 UNIT/ML (KWIKPEN)
PEN_INJECTOR | SUBCUTANEOUS | 1 refills | Status: DC
Start: 2022-08-26 — End: 2022-12-09

## 2022-08-26 MED ORDER — LEVEMIR FLEXPEN 100 UNIT/ML ~~LOC~~ SOPN
PEN_INJECTOR | SUBCUTANEOUS | 1 refills | Status: DC
Start: 2022-08-26 — End: 2022-12-05

## 2022-08-26 MED ORDER — GLUCAGON 1 MG/0.2ML ~~LOC~~ SOLN: 1.0000 mg | SUBCUTANEOUS | 1 refills | Status: DC | PRN

## 2022-08-26 MED ORDER — BIMATOPROST 0.03 % EX SOLN: 1.0000 "application " | Freq: Every day | CUTANEOUS | 4 refills | Status: DC

## 2022-08-26 NOTE — Addendum Note (Signed)
Addended by: Jomarie Longs on: 08/26/2022 02:28 PM   Modules accepted: Orders

## 2022-08-29 NOTE — Telephone Encounter (Signed)
Attemted to call pt. No answer. Left VM

## 2022-10-16 ENCOUNTER — Other Ambulatory Visit: Payer: Self-pay | Admitting: Physician Assistant

## 2022-10-16 DIAGNOSIS — F411 Generalized anxiety disorder: Secondary | ICD-10-CM

## 2022-10-16 DIAGNOSIS — F3340 Major depressive disorder, recurrent, in remission, unspecified: Secondary | ICD-10-CM

## 2022-10-16 DIAGNOSIS — Z760 Encounter for issue of repeat prescription: Secondary | ICD-10-CM

## 2022-11-12 ENCOUNTER — Encounter: Payer: Self-pay | Admitting: Physician Assistant

## 2022-11-12 ENCOUNTER — Ambulatory Visit: Payer: BC Managed Care – PPO | Admitting: Physician Assistant

## 2022-11-12 VITALS — BP 139/71 | HR 90 | Resp 20 | Ht 64.0 in | Wt 172.0 lb

## 2022-11-12 DIAGNOSIS — D508 Other iron deficiency anemias: Secondary | ICD-10-CM

## 2022-11-12 DIAGNOSIS — E1043 Type 1 diabetes mellitus with diabetic autonomic (poly)neuropathy: Secondary | ICD-10-CM

## 2022-11-12 DIAGNOSIS — E7800 Pure hypercholesterolemia, unspecified: Secondary | ICD-10-CM

## 2022-11-12 DIAGNOSIS — F411 Generalized anxiety disorder: Secondary | ICD-10-CM

## 2022-11-12 DIAGNOSIS — F909 Attention-deficit hyperactivity disorder, unspecified type: Secondary | ICD-10-CM

## 2022-11-12 DIAGNOSIS — Z760 Encounter for issue of repeat prescription: Secondary | ICD-10-CM

## 2022-11-12 DIAGNOSIS — B377 Candidal sepsis: Secondary | ICD-10-CM | POA: Diagnosis not present

## 2022-11-12 DIAGNOSIS — F3340 Major depressive disorder, recurrent, in remission, unspecified: Secondary | ICD-10-CM

## 2022-11-12 DIAGNOSIS — I10 Essential (primary) hypertension: Secondary | ICD-10-CM

## 2022-11-12 DIAGNOSIS — E103593 Type 1 diabetes mellitus with proliferative diabetic retinopathy without macular edema, bilateral: Secondary | ICD-10-CM

## 2022-11-12 DIAGNOSIS — F5101 Primary insomnia: Secondary | ICD-10-CM

## 2022-11-12 DIAGNOSIS — H02729 Madarosis of unspecified eye, unspecified eyelid and periocular area: Secondary | ICD-10-CM

## 2022-11-18 MED ORDER — ZOLPIDEM TARTRATE 10 MG PO TABS
10.0000 mg | ORAL_TABLET | Freq: Every evening | ORAL | 1 refills | Status: DC | PRN
Start: 2022-11-18 — End: 2023-01-01

## 2022-11-18 MED ORDER — AMPHETAMINE-DEXTROAMPHET ER 30 MG PO CP24
30.0000 mg | ORAL_CAPSULE | Freq: Every day | ORAL | 0 refills | Status: DC
Start: 2022-11-18 — End: 2022-12-09

## 2022-11-18 MED ORDER — ALPRAZOLAM 1 MG PO TABS
ORAL_TABLET | ORAL | 1 refills | Status: DC
Start: 2022-11-18 — End: 2023-01-01

## 2022-11-18 MED ORDER — FLUCONAZOLE 150 MG PO TABS: 150.0000 mg | ORAL_TABLET | Freq: Every day | ORAL | 1 refills | Status: DC

## 2022-11-18 MED ORDER — TRAMADOL HCL 50 MG PO TABS: ORAL_TABLET | ORAL | 0 refills | Status: DC

## 2022-11-21 ENCOUNTER — Encounter: Payer: Self-pay | Admitting: Physician Assistant

## 2022-12-05 ENCOUNTER — Other Ambulatory Visit: Payer: Self-pay

## 2022-12-05 ENCOUNTER — Telehealth: Payer: Self-pay | Admitting: Physician Assistant

## 2022-12-05 DIAGNOSIS — Z760 Encounter for issue of repeat prescription: Secondary | ICD-10-CM

## 2022-12-05 DIAGNOSIS — E103593 Type 1 diabetes mellitus with proliferative diabetic retinopathy without macular edema, bilateral: Secondary | ICD-10-CM

## 2022-12-05 DIAGNOSIS — E1043 Type 1 diabetes mellitus with diabetic autonomic (poly)neuropathy: Secondary | ICD-10-CM

## 2022-12-05 MED ORDER — LEVEMIR FLEXPEN 100 UNIT/ML ~~LOC~~ SOPN: PEN_INJECTOR | SUBCUTANEOUS | 1 refills | Status: DC

## 2022-12-05 NOTE — Telephone Encounter (Signed)
Husband called he is asking if patient could get a 90 day supply of Humalog Kwikpen  Patient is running low   [facility] [phone] [address] [facility]  Phone number [phone]

## 2022-12-08 ENCOUNTER — Telehealth: Payer: Self-pay | Admitting: Physician Assistant

## 2022-12-08 DIAGNOSIS — F909 Attention-deficit hyperactivity disorder, unspecified type: Secondary | ICD-10-CM

## 2022-12-08 NOTE — Telephone Encounter (Signed)
Prescription Request  12/08/2022  LOV: 11/12/2022  What is the name of the medication or equipment? insulin lispro (HUMALOG KWIKPEN) 100 UNIT/ML KwikPen amphetamine-dextroamphetamine (ADDERALL XR) 30 MG 24 hr capsule   Have you contacted your pharmacy to request a refill? Yes   Which pharmacy would you like this sent to?   [id number] - [place], [place] - [address] [place] [address] Phone: [phone] Fax: [phone]    Patient notified that their request is being sent to the clinical staff for review and that they should receive a response within 2 business days.   Please advise at [facility] [phone]

## 2022-12-08 NOTE — Telephone Encounter (Signed)
Patient called in again stating that she really needs this done today, stated the turnaround time. Also states that the prescriptions needed to be separate

## 2022-12-09 ENCOUNTER — Telehealth: Payer: Self-pay | Admitting: Physician Assistant

## 2022-12-09 DIAGNOSIS — F909 Attention-deficit hyperactivity disorder, unspecified type: Secondary | ICD-10-CM

## 2022-12-09 MED ORDER — INSULIN LISPRO (1 UNIT DIAL) 100 UNIT/ML (KWIKPEN)
PEN_INJECTOR | SUBCUTANEOUS | 1 refills | Status: DC
Start: 2022-12-09 — End: 2023-01-01

## 2022-12-09 MED ORDER — AMPHETAMINE-DEXTROAMPHET ER 30 MG PO CP24
30.0000 mg | ORAL_CAPSULE | Freq: Every day | ORAL | 0 refills | Status: DC
Start: 2022-12-09 — End: 2023-01-01

## 2022-12-09 MED ORDER — LISDEXAMFETAMINE DIMESYLATE 70 MG PO CAPS: 70.0000 mg | ORAL_CAPSULE | Freq: Every day | ORAL | 0 refills | Status: DC

## 2022-12-09 NOTE — Telephone Encounter (Signed)
I called patient to inform her that her medications had been submitted twice pharmacy informed me that the Adderall XR 30 mg is on backorder that's it a nationwide shortage the Humalog has been filled and is waiting to be picked up she is now asking if you could change her Adderall XR 30mg  to something else and also increase her insulin

## 2022-12-09 NOTE — Telephone Encounter (Signed)
Patient is requesting Addrerall xr 30mg  please submit to [facility] Phone number [phone]

## 2022-12-09 NOTE — Telephone Encounter (Signed)
Left message advising of medication.

## 2022-12-09 NOTE — Telephone Encounter (Signed)
Zelia wants Vyvanse sent to [facility].

## 2022-12-09 NOTE — Addendum Note (Signed)
Addended by: Jomarie Longs on: 12/09/2022 08:09 AM   Modules accepted: Orders

## 2022-12-09 NOTE — Telephone Encounter (Signed)
Sent   Please let pt know

## 2022-12-27 ENCOUNTER — Other Ambulatory Visit: Payer: Self-pay | Admitting: Physician Assistant

## 2022-12-27 DIAGNOSIS — Z760 Encounter for issue of repeat prescription: Secondary | ICD-10-CM

## 2022-12-27 DIAGNOSIS — F3340 Major depressive disorder, recurrent, in remission, unspecified: Secondary | ICD-10-CM

## 2022-12-27 DIAGNOSIS — F411 Generalized anxiety disorder: Secondary | ICD-10-CM

## 2022-12-29 NOTE — Telephone Encounter (Signed)
Spouse called. Requesting a 2 week supply on Levemir (took last shot this morning) and Humalog(tout of this month). After this [facility].

## 2023-01-25 DEATH — deceased

## 2023-02-11 ENCOUNTER — Telehealth: Payer: BC Managed Care – PPO | Admitting: Physician Assistant
